# Patient Record
Sex: Female | Born: 1950
Health system: Southern US, Community
[De-identification: ages and names within clinical notes are randomized; demographics above are authoritative.]

## PROBLEM LIST (undated history)

## (undated) DIAGNOSIS — I248 Other forms of acute ischemic heart disease: Secondary | ICD-10-CM

## (undated) DIAGNOSIS — C4491 Basal cell carcinoma of skin, unspecified: Secondary | ICD-10-CM

## (undated) DIAGNOSIS — F32A Depression, unspecified: Secondary | ICD-10-CM

## (undated) DIAGNOSIS — I471 Supraventricular tachycardia, unspecified: Secondary | ICD-10-CM

## (undated) DIAGNOSIS — I2510 Atherosclerotic heart disease of native coronary artery without angina pectoris: Secondary | ICD-10-CM

## (undated) DIAGNOSIS — M810 Age-related osteoporosis without current pathological fracture: Secondary | ICD-10-CM

## (undated) DIAGNOSIS — D649 Anemia, unspecified: Secondary | ICD-10-CM

## (undated) DIAGNOSIS — I10 Essential (primary) hypertension: Secondary | ICD-10-CM

## (undated) DIAGNOSIS — I358 Other nonrheumatic aortic valve disorders: Secondary | ICD-10-CM

## (undated) DIAGNOSIS — F419 Anxiety disorder, unspecified: Secondary | ICD-10-CM

## (undated) DIAGNOSIS — M23300 Other meniscus derangements, unspecified lateral meniscus, right knee: Secondary | ICD-10-CM

## (undated) DIAGNOSIS — F329 Major depressive disorder, single episode, unspecified: Secondary | ICD-10-CM

## (undated) DIAGNOSIS — I7300 Raynaud's syndrome without gangrene: Secondary | ICD-10-CM

## (undated) DIAGNOSIS — K219 Gastro-esophageal reflux disease without esophagitis: Secondary | ICD-10-CM

## (undated) DIAGNOSIS — M1990 Unspecified osteoarthritis, unspecified site: Secondary | ICD-10-CM

## (undated) HISTORY — PX: MOUTH SURGERY: SHX715

## (undated) HISTORY — PX: EYE SURGERY: SHX253

## (undated) HISTORY — PX: CATARACT EXTRACTION, BILATERAL: SHX1313

## (undated) HISTORY — PX: COLONOSCOPY: SHX174

---
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
## 1998-06-27 ENCOUNTER — Other Ambulatory Visit: Admission: RE | Admit: 1998-06-27 | Discharge: 1998-06-27 | Payer: Self-pay | Admitting: Obstetrics & Gynecology

## 1999-03-29 ENCOUNTER — Other Ambulatory Visit: Admission: RE | Admit: 1999-03-29 | Discharge: 1999-03-29 | Payer: Self-pay | Admitting: Obstetrics & Gynecology

## 1999-11-05 ENCOUNTER — Other Ambulatory Visit: Admission: RE | Admit: 1999-11-05 | Discharge: 1999-11-05 | Payer: Self-pay | Admitting: Obstetrics & Gynecology

## 2000-11-18 ENCOUNTER — Other Ambulatory Visit: Admission: RE | Admit: 2000-11-18 | Discharge: 2000-11-18 | Payer: Self-pay | Admitting: Obstetrics & Gynecology

## 2001-01-21 ENCOUNTER — Other Ambulatory Visit: Admission: RE | Admit: 2001-01-21 | Discharge: 2001-01-21 | Payer: Self-pay | Admitting: Obstetrics & Gynecology

## 2002-04-06 ENCOUNTER — Other Ambulatory Visit: Admission: RE | Admit: 2002-04-06 | Discharge: 2002-04-06 | Payer: Self-pay | Admitting: Obstetrics & Gynecology

## 2003-04-08 ENCOUNTER — Other Ambulatory Visit: Admission: RE | Admit: 2003-04-08 | Discharge: 2003-04-08 | Payer: Self-pay | Admitting: Obstetrics & Gynecology

## 2004-05-04 ENCOUNTER — Other Ambulatory Visit: Admission: RE | Admit: 2004-05-04 | Discharge: 2004-05-04 | Payer: Self-pay | Admitting: Obstetrics & Gynecology

## 2005-05-06 ENCOUNTER — Other Ambulatory Visit: Admission: RE | Admit: 2005-05-06 | Discharge: 2005-05-06 | Payer: Self-pay | Admitting: Obstetrics & Gynecology

## 2005-06-24 ENCOUNTER — Ambulatory Visit (HOSPITAL_COMMUNITY): Admission: RE | Admit: 2005-06-24 | Discharge: 2005-06-24 | Payer: Self-pay | Admitting: Gastroenterology

## 2011-07-25 ENCOUNTER — Other Ambulatory Visit: Payer: Self-pay | Admitting: Dermatology

## 2013-08-16 ENCOUNTER — Encounter (HOSPITAL_COMMUNITY): Payer: Self-pay | Admitting: Emergency Medicine

## 2013-08-16 ENCOUNTER — Emergency Department (INDEPENDENT_AMBULATORY_CARE_PROVIDER_SITE_OTHER)
Admission: EM | Admit: 2013-08-16 | Discharge: 2013-08-16 | Disposition: A | Discharge status: Home / Self Care | Payer: 59 | Source: Home / Self Care | Attending: Emergency Medicine | Admitting: Emergency Medicine

## 2013-08-16 DIAGNOSIS — H8309 Labyrinthitis, unspecified ear: Secondary | ICD-10-CM

## 2013-08-16 HISTORY — DX: Anxiety disorder, unspecified: F41.9

## 2013-08-16 HISTORY — DX: Essential (primary) hypertension: I10

## 2013-08-16 HISTORY — DX: Depression, unspecified: F32.A

## 2013-08-16 HISTORY — DX: Major depressive disorder, single episode, unspecified: F32.9

## 2013-08-16 LAB — POCT I-STAT, CHEM 8
BUN: 16 mg/dL (ref 6–23)
Calcium, Ion: 1.25 mmol/L (ref 1.13–1.30)
Chloride: 96 mEq/L (ref 96–112)
Creatinine, Ser: 1.1 mg/dL (ref 0.50–1.10)
Glucose, Bld: 105 mg/dL — ABNORMAL HIGH (ref 70–99)
HCT: 40 % (ref 36.0–46.0)
Hemoglobin: 13.6 g/dL (ref 12.0–15.0)
Potassium: 3.9 mEq/L (ref 3.5–5.1)
Sodium: 133 mEq/L — ABNORMAL LOW (ref 135–145)
TCO2: 24 mmol/L (ref 0–100)

## 2013-08-16 MED ORDER — MECLIZINE HCL 25 MG PO TABS: 25.0000 mg | ORAL_TABLET | Freq: Three times a day (TID) | ORAL | Status: DC | PRN

## 2013-08-16 NOTE — ED Notes (Signed)
States she had usual AM, oatmeal and coffee for breakfast, coffee at work,  but became dizzy while at work. Ate an apple aprox 45 min PTA to see if that helped. At present, w/d/color good, c/o dizzy , worse when she changes position; denies pain , nausea

## 2013-08-16 NOTE — ED Provider Notes (Signed)
Chief Complaint:   Chief Complaint  Patient presents with  . Dizziness    History of Present Illness:    Cynthia [name] is a 62 year old female, a hospital employee, who experienced fairly sudden onset of dizziness this morning while at work. She was sitting at her desk, looking at her computer screen. The patient describes a sensation of her head spinning and like she's about to pass out. She did not lose consciousness. The dizziness is worse with lying flat or bending over and worse when she closed her eyes. His better she's completely still. She denies any associated nausea or vomiting. She's had no earache, ringing in ears, or difficulty hearing. She denies any headache, diplopia, or blurry vision. She's had no nasal congestion, rhinorrhea, or sneezing. She denies any shortness of breath or chest pain, pressure, tightness, or palpitations. She has had no paresthesias, localized muscle weakness, difficulty with speech, swallowing, or ambulation with the exception that she feels somewhat unsteady on her feet. She has never had anything like this before.  Review of Systems:  Other than noted above, the patient denies any of the following symptoms: Systemic:  No fever, chills, fatigue, or weight loss. Eye:  No blurred vision, visual change or diplopia. ENT:  No ear pain, tinnitus, hearing loss, nasal congestion, or rhinorrhea. Cardiac:  No chest pain, dyspnea, palpitations or syncope. Neuro:  No headache, paresthesias, weakness, trouble with speech, coordination or ambulation.  PMFSH:  Past medical history, family history, social history, meds, and allergies were reviewed.  She is allergic to penicillin. She has high blood pressure and takes lisinopril/HCTZ, Wellbutrin, and Xanax.  Physical Exam:   Filed Vitals:   08/16/13 1108 Sitting  08/16/13 1214 Supine  08/16/13 1215 Standing   BP: 149/81 124/68 114/74  Pulse: 69 66 77  Temp: 97.7 F (36.5 C)    TempSrc: Oral    Resp: 20    SpO2:  100%     General:  Alert, oriented times 3, in no distress. Eye:  PERRL, full EOM, no nystagmus. ENT:  TMs and canals normal.  Nasal mucosa normal.  Pharynx clear. Neck:  No adenopathy, tenderness, or mass.  Thyroid normal.  No carotid bruit. Lungs:  Breath sounds clear and equal bilaterally.  No wheezes, rales or rhonchi. Heart:  Regular rhythm.  No gallops, murmers, or rubs. Neuro:  Alert and oriented times 3.  Cranial nerves intact.  No pronator drift.  Finger to nose normal.  No focal weakness.  Sensation intact to light touch.  Romberg's sign negative, gait normal.  Able to do tandem gait well.  Dix-Hallpike maneuver was negative and that it did not make her dizziness any better or worse. She has ongoing symptoms of dizziness during the procedure.  Labs:   Results for orders placed during the hospital encounter of 08/16/13  POCT I-STAT, CHEM 8      Result Value Range   Sodium 133 (*) 135 - 145 mEq/L   Potassium 3.9  3.5 - 5.1 mEq/L   Chloride 96  96 - 112 mEq/L   BUN 16  6 - 23 mg/dL   Creatinine, Ser 9.60  0.50 - 1.10 mg/dL   Glucose, Bld 454 (*) 70 - 99 mg/dL   Calcium, Ion 0.98  1.19 - 1.30 mmol/L   TCO2 24  0 - 100 mmol/L   Hemoglobin 13.6  12.0 - 15.0 g/dL   HCT 14.7  82.9 - 56.2 %     Assessment:  The encounter diagnosis  was Acute labyrinthitis, unspecified laterality.  Plan:   1.  Meds:  The following meds were prescribed:   New Prescriptions   MECLIZINE (ANTIVERT) 25 MG TABLET    Take 1 tablet (25 mg total) by mouth 3 (three) times daily as needed for dizziness.    2.  Patient Education/Counseling:  The patient was given appropriate handouts, self care instructions, and instructed in symptomatic relief.  Advised rest over the next couple days and extra fluids.  3.  Follow up:  The patient was told to follow up if no better in 3 to 4 days, if becoming worse in any way, and given some red flag symptoms such as any new neurological symptoms which would prompt immediate  return.  Follow up with Dr. Kelli Churn if no better in one week.     Reuben Likes, MD 08/16/13 1254

## 2013-08-23 ENCOUNTER — Other Ambulatory Visit: Payer: Self-pay | Admitting: Obstetrics & Gynecology

## 2013-10-25 ENCOUNTER — Ambulatory Visit (INDEPENDENT_AMBULATORY_CARE_PROVIDER_SITE_OTHER): Payer: 59 | Admitting: Licensed Clinical Social Worker

## 2013-10-25 DIAGNOSIS — F411 Generalized anxiety disorder: Secondary | ICD-10-CM

## 2013-10-25 DIAGNOSIS — F331 Major depressive disorder, recurrent, moderate: Secondary | ICD-10-CM

## 2013-11-22 ENCOUNTER — Ambulatory Visit (INDEPENDENT_AMBULATORY_CARE_PROVIDER_SITE_OTHER): Payer: 59 | Admitting: Licensed Clinical Social Worker

## 2013-11-22 DIAGNOSIS — F411 Generalized anxiety disorder: Secondary | ICD-10-CM

## 2013-11-22 DIAGNOSIS — F331 Major depressive disorder, recurrent, moderate: Secondary | ICD-10-CM

## 2013-12-13 ENCOUNTER — Ambulatory Visit (INDEPENDENT_AMBULATORY_CARE_PROVIDER_SITE_OTHER): Payer: 59 | Admitting: Licensed Clinical Social Worker

## 2013-12-13 DIAGNOSIS — F411 Generalized anxiety disorder: Secondary | ICD-10-CM

## 2013-12-13 DIAGNOSIS — F331 Major depressive disorder, recurrent, moderate: Secondary | ICD-10-CM

## 2014-01-10 ENCOUNTER — Ambulatory Visit (INDEPENDENT_AMBULATORY_CARE_PROVIDER_SITE_OTHER): Payer: 59 | Admitting: Licensed Clinical Social Worker

## 2014-01-10 DIAGNOSIS — F331 Major depressive disorder, recurrent, moderate: Secondary | ICD-10-CM

## 2014-01-10 DIAGNOSIS — F411 Generalized anxiety disorder: Secondary | ICD-10-CM

## 2014-02-07 ENCOUNTER — Ambulatory Visit: Payer: 59 | Admitting: Licensed Clinical Social Worker

## 2014-02-14 ENCOUNTER — Ambulatory Visit (INDEPENDENT_AMBULATORY_CARE_PROVIDER_SITE_OTHER): Payer: 59 | Admitting: Licensed Clinical Social Worker

## 2014-02-14 DIAGNOSIS — F411 Generalized anxiety disorder: Secondary | ICD-10-CM

## 2014-02-14 DIAGNOSIS — F331 Major depressive disorder, recurrent, moderate: Secondary | ICD-10-CM

## 2014-03-28 ENCOUNTER — Ambulatory Visit (INDEPENDENT_AMBULATORY_CARE_PROVIDER_SITE_OTHER): Payer: 59 | Admitting: Licensed Clinical Social Worker

## 2014-03-28 DIAGNOSIS — F411 Generalized anxiety disorder: Secondary | ICD-10-CM

## 2014-03-28 DIAGNOSIS — F331 Major depressive disorder, recurrent, moderate: Secondary | ICD-10-CM

## 2014-04-25 ENCOUNTER — Ambulatory Visit (INDEPENDENT_AMBULATORY_CARE_PROVIDER_SITE_OTHER): Payer: 59 | Admitting: Licensed Clinical Social Worker

## 2014-04-25 DIAGNOSIS — F411 Generalized anxiety disorder: Secondary | ICD-10-CM

## 2014-04-25 DIAGNOSIS — F331 Major depressive disorder, recurrent, moderate: Secondary | ICD-10-CM

## 2014-06-13 ENCOUNTER — Ambulatory Visit (INDEPENDENT_AMBULATORY_CARE_PROVIDER_SITE_OTHER): Payer: 59 | Admitting: Licensed Clinical Social Worker

## 2014-06-13 DIAGNOSIS — F331 Major depressive disorder, recurrent, moderate: Secondary | ICD-10-CM

## 2014-06-13 DIAGNOSIS — F411 Generalized anxiety disorder: Secondary | ICD-10-CM

## 2014-07-18 ENCOUNTER — Ambulatory Visit (INDEPENDENT_AMBULATORY_CARE_PROVIDER_SITE_OTHER): Payer: 59 | Admitting: Licensed Clinical Social Worker

## 2014-07-18 DIAGNOSIS — F411 Generalized anxiety disorder: Secondary | ICD-10-CM

## 2014-07-18 DIAGNOSIS — F332 Major depressive disorder, recurrent severe without psychotic features: Secondary | ICD-10-CM

## 2014-09-12 ENCOUNTER — Ambulatory Visit: Payer: 59 | Admitting: Licensed Clinical Social Worker

## 2014-10-10 ENCOUNTER — Ambulatory Visit (INDEPENDENT_AMBULATORY_CARE_PROVIDER_SITE_OTHER): Payer: 59 | Admitting: Licensed Clinical Social Worker

## 2014-10-10 DIAGNOSIS — F332 Major depressive disorder, recurrent severe without psychotic features: Secondary | ICD-10-CM

## 2014-10-10 DIAGNOSIS — F411 Generalized anxiety disorder: Secondary | ICD-10-CM

## 2015-10-23 MED FILL — NITROFURANTOIN MCR 100 MG C: 100 | 30 days supply | Qty: 30 | Fill #0

## 2015-10-23 MED FILL — NYSTATIN/TRIAMCINOLONE CRM: 100000-0.1 | 30 days supply | Qty: 30 | Fill #1

## 2015-11-22 MED FILL — NITROFURANTOIN MCR 100 MG C: 100 | 30 days supply | Qty: 30 | Fill #1

## 2015-11-22 MED FILL — BUPROPION HCL XL 300 MG TAB: 300 | 90 days supply | Qty: 90 | Fill #1

## 2015-11-22 MED FILL — NYSTATIN/TRIAMCINOLONE CRM: 100000-0.1 | 30 days supply | Qty: 30 | Fill #2

## 2015-11-30 DIAGNOSIS — H25013 Cortical age-related cataract, bilateral: Secondary | ICD-10-CM | POA: Diagnosis not present

## 2015-11-30 DIAGNOSIS — H2513 Age-related nuclear cataract, bilateral: Secondary | ICD-10-CM | POA: Diagnosis not present

## 2015-12-11 MED FILL — clonazePAM 0.5 MG TABS: 0.5 | 90 days supply | Qty: 180 | Fill #1

## 2015-12-13 DIAGNOSIS — H25012 Cortical age-related cataract, left eye: Secondary | ICD-10-CM | POA: Diagnosis not present

## 2015-12-13 DIAGNOSIS — H2512 Age-related nuclear cataract, left eye: Secondary | ICD-10-CM | POA: Diagnosis not present

## 2015-12-13 MED FILL — OFLOXACIN 0.3% EYE DROPS: 0.3 | 25 days supply | Qty: 5 | Fill #0

## 2015-12-20 MED FILL — PROLENSA 0.07% EYE DROPS: 0.07 | 30 days supply | Qty: 3 | Fill #0

## 2016-01-03 DIAGNOSIS — H25012 Cortical age-related cataract, left eye: Secondary | ICD-10-CM | POA: Diagnosis not present

## 2016-01-03 DIAGNOSIS — H2512 Age-related nuclear cataract, left eye: Secondary | ICD-10-CM | POA: Diagnosis not present

## 2016-01-04 MED FILL — NYSTATIN/TRIAMCINOLONE CRM: 100000-0.1 | 30 days supply | Qty: 30 | Fill #3

## 2016-01-04 MED FILL — NITROFURANTOIN MCR 100 MG C: 100 | 30 days supply | Qty: 30 | Fill #2

## 2016-01-05 DIAGNOSIS — H2511 Age-related nuclear cataract, right eye: Secondary | ICD-10-CM | POA: Diagnosis not present

## 2016-01-05 DIAGNOSIS — H25011 Cortical age-related cataract, right eye: Secondary | ICD-10-CM | POA: Diagnosis not present

## 2016-01-05 MED FILL — OFLOXACIN 0.3% EYE DROPS: 0.3 | 25 days supply | Qty: 5 | Fill #1

## 2016-01-09 MED FILL — PROLENSA 0.07% EYE DROPS: 0.07 | 30 days supply | Qty: 3 | Fill #1

## 2016-01-10 DIAGNOSIS — H25011 Cortical age-related cataract, right eye: Secondary | ICD-10-CM | POA: Diagnosis not present

## 2016-01-10 DIAGNOSIS — H2511 Age-related nuclear cataract, right eye: Secondary | ICD-10-CM | POA: Diagnosis not present

## 2016-01-29 MED FILL — LISINOPRIL-HCTZ 20-12.5 MG: 20-12.5 | 90 days supply | Qty: 90 | Fill #1

## 2016-01-29 MED FILL — NYSTATIN/TRIAMCINOLONE CRM: 100000-0.1 | 30 days supply | Qty: 30 | Fill #4

## 2016-01-29 MED FILL — CLINDAMYCIN HCL 150 MG CAP: 150 | 7 days supply | Qty: 28 | Fill #0

## 2016-02-19 MED FILL — CLINDAMYCIN HCL 150 MG CAP: 150 | 7 days supply | Qty: 28 | Fill #1

## 2016-02-29 DIAGNOSIS — E559 Vitamin D deficiency, unspecified: Secondary | ICD-10-CM | POA: Diagnosis not present

## 2016-02-29 DIAGNOSIS — F324 Major depressive disorder, single episode, in partial remission: Secondary | ICD-10-CM | POA: Diagnosis not present

## 2016-02-29 DIAGNOSIS — G4700 Insomnia, unspecified: Secondary | ICD-10-CM | POA: Diagnosis not present

## 2016-02-29 DIAGNOSIS — F419 Anxiety disorder, unspecified: Secondary | ICD-10-CM | POA: Diagnosis not present

## 2016-02-29 DIAGNOSIS — I10 Essential (primary) hypertension: Secondary | ICD-10-CM | POA: Diagnosis not present

## 2016-02-29 DIAGNOSIS — E7800 Pure hypercholesterolemia, unspecified: Secondary | ICD-10-CM | POA: Diagnosis not present

## 2016-02-29 DIAGNOSIS — E669 Obesity, unspecified: Secondary | ICD-10-CM | POA: Diagnosis not present

## 2016-03-01 MED FILL — SERTRALINE HCL 50 MG TABLET: 50 | 90 days supply | Qty: 90 | Fill #0

## 2016-03-01 MED FILL — BUPROPION HCL XL 300 MG TAB: 300 | 90 days supply | Qty: 90 | Fill #0

## 2016-03-04 MED FILL — VIT D2 1.25 MG (50,000 UNIT: 1.25 MG | 84 days supply | Qty: 12 | Fill #0

## 2016-03-08 MED FILL — clonazePAM 0.5 MG TABS: 0.5 | 90 days supply | Qty: 180 | Fill #0

## 2016-03-11 MED FILL — NYSTATIN/TRIAMCINOLONE CRM: 100000-0.1 | 30 days supply | Qty: 30 | Fill #5

## 2016-03-22 MED FILL — NITROFURANTOIN MCR 100 MG C: 100 | 30 days supply | Qty: 30 | Fill #3

## 2016-04-22 MED FILL — NYSTATIN/TRIAMCINOLONE CRM: 100000-0.1 | 30 days supply | Qty: 30 | Fill #6

## 2016-05-13 MED FILL — LISINOPRIL-HCTZ 20-12.5 MG: 20-12.5 | 90 days supply | Qty: 90 | Fill #0

## 2016-05-20 DIAGNOSIS — Z961 Presence of intraocular lens: Secondary | ICD-10-CM | POA: Diagnosis not present

## 2016-05-20 MED FILL — NITROFURANTOIN MCR 100 MG C: 100 | 30 days supply | Qty: 30 | Fill #4

## 2016-05-20 MED FILL — BUPROPION HCL XL 300 MG TAB: 300 | 90 days supply | Qty: 90 | Fill #1

## 2016-05-20 MED FILL — NYSTATIN/TRIAMCINOLONE CRM: 100000-0.1 | 15 days supply | Qty: 30 | Fill #1

## 2016-06-10 MED FILL — clonazePAM 0.5 MG TABS: 0.5 | 90 days supply | Qty: 180 | Fill #0

## 2016-06-10 MED FILL — SERTRALINE HCL 50 MG TABLET: 50 | 90 days supply | Qty: 90 | Fill #1

## 2016-07-19 MED FILL — NYSTATIN/TRIAMCINOLONE CRM: 100000-0.1 | 15 days supply | Qty: 30 | Fill #2

## 2016-08-14 MED FILL — NITROFURANTOIN MCR 100 MG C: 100 | 30 days supply | Qty: 30 | Fill #0

## 2016-08-14 MED FILL — LISINOPRIL-HCTZ 20-12.5 MG: 20-12.5 | 90 days supply | Qty: 90 | Fill #1

## 2016-08-14 MED FILL — NYSTATIN/TRIAMCINOLONE CRM: 100000-0.1 | 15 days supply | Qty: 30 | Fill #0

## 2016-08-19 MED FILL — BUPROPION HCL XL 300 MG TAB: 300 | 90 days supply | Qty: 90 | Fill #0

## 2016-09-05 DIAGNOSIS — Z23 Encounter for immunization: Secondary | ICD-10-CM | POA: Diagnosis not present

## 2016-09-05 DIAGNOSIS — G4700 Insomnia, unspecified: Secondary | ICD-10-CM | POA: Diagnosis not present

## 2016-09-05 DIAGNOSIS — F419 Anxiety disorder, unspecified: Secondary | ICD-10-CM | POA: Diagnosis not present

## 2016-09-05 DIAGNOSIS — E669 Obesity, unspecified: Secondary | ICD-10-CM | POA: Diagnosis not present

## 2016-09-05 DIAGNOSIS — E7800 Pure hypercholesterolemia, unspecified: Secondary | ICD-10-CM | POA: Diagnosis not present

## 2016-09-05 DIAGNOSIS — I10 Essential (primary) hypertension: Secondary | ICD-10-CM | POA: Diagnosis not present

## 2016-09-05 DIAGNOSIS — F324 Major depressive disorder, single episode, in partial remission: Secondary | ICD-10-CM | POA: Diagnosis not present

## 2016-09-05 MED FILL — SERTRALINE HCL 50 MG TABLET: 50 | 90 days supply | Qty: 90 | Fill #0

## 2016-09-06 MED FILL — clonazePAM 0.5 MG TABS: 0.5 | 90 days supply | Qty: 180 | Fill #0

## 2016-09-16 ENCOUNTER — Other Ambulatory Visit: Payer: Self-pay | Admitting: Obstetrics & Gynecology

## 2016-09-16 DIAGNOSIS — Z124 Encounter for screening for malignant neoplasm of cervix: Secondary | ICD-10-CM | POA: Diagnosis not present

## 2016-09-16 DIAGNOSIS — Z01419 Encounter for gynecological examination (general) (routine) without abnormal findings: Secondary | ICD-10-CM | POA: Diagnosis not present

## 2016-09-16 DIAGNOSIS — Z1231 Encounter for screening mammogram for malignant neoplasm of breast: Secondary | ICD-10-CM | POA: Diagnosis not present

## 2016-09-16 MED FILL — NYSTATIN/TRIAMCINOLONE CRM: 100000-0.1 | 15 days supply | Qty: 30 | Fill #0

## 2016-09-16 MED FILL — NITROFURANTOIN MCR 100 MG C: 100 | 30 days supply | Qty: 30 | Fill #0

## 2016-09-17 LAB — CYTOLOGY - PAP

## 2016-09-18 MED FILL — PREMARIN VAGINAL CREAM-APPL: 0.625 | 90 days supply | Qty: 30 | Fill #0

## 2016-10-03 DIAGNOSIS — F324 Major depressive disorder, single episode, in partial remission: Secondary | ICD-10-CM | POA: Diagnosis not present

## 2016-11-25 MED FILL — BUPROPION HCL XL 300 MG TAB: 300 | 90 days supply | Qty: 90 | Fill #1

## 2016-11-25 MED FILL — VIT D2 1.25 MG (50,000 UNIT: 1.25 MG | 84 days supply | Qty: 12 | Fill #1

## 2016-11-25 MED FILL — LISINOPRIL-HCTZ 20-12.5 MG: 20-12.5 | 90 days supply | Qty: 90 | Fill #0

## 2016-11-25 MED FILL — NYSTATIN/TRIAMCINOLONE CRM: 100000-0.1 | 15 days supply | Qty: 30 | Fill #1

## 2016-12-02 MED FILL — clonazePAM 0.5 MG TABS: 0.5 | 90 days supply | Qty: 180 | Fill #1

## 2016-12-02 MED FILL — SERTRALINE HCL 50 MG TABLET: 50 | 90 days supply | Qty: 90 | Fill #1

## 2017-01-07 DIAGNOSIS — Z85828 Personal history of other malignant neoplasm of skin: Secondary | ICD-10-CM | POA: Diagnosis not present

## 2017-01-07 DIAGNOSIS — D2239 Melanocytic nevi of other parts of face: Secondary | ICD-10-CM | POA: Diagnosis not present

## 2017-01-07 DIAGNOSIS — L738 Other specified follicular disorders: Secondary | ICD-10-CM | POA: Diagnosis not present

## 2017-01-13 DIAGNOSIS — S83241A Other tear of medial meniscus, current injury, right knee, initial encounter: Secondary | ICD-10-CM | POA: Diagnosis not present

## 2017-01-14 ENCOUNTER — Other Ambulatory Visit: Payer: Self-pay | Admitting: Orthopaedic Surgery

## 2017-01-14 DIAGNOSIS — S83241A Other tear of medial meniscus, current injury, right knee, initial encounter: Secondary | ICD-10-CM

## 2017-01-15 MED FILL — NYSTATIN/TRIAMCINOLONE CRM: 100000-0.1 | 15 days supply | Qty: 30 | Fill #2

## 2017-01-18 ENCOUNTER — Ambulatory Visit
Admission: RE | Admit: 2017-01-18 | Discharge: 2017-01-18 | Disposition: A | Discharge status: Home / Self Care | Payer: 59 | Source: Ambulatory Visit | Attending: Orthopaedic Surgery | Admitting: Orthopaedic Surgery

## 2017-01-18 DIAGNOSIS — S83241A Other tear of medial meniscus, current injury, right knee, initial encounter: Secondary | ICD-10-CM

## 2017-01-22 DIAGNOSIS — M25561 Pain in right knee: Secondary | ICD-10-CM | POA: Diagnosis not present

## 2017-01-22 DIAGNOSIS — S83241A Other tear of medial meniscus, current injury, right knee, initial encounter: Secondary | ICD-10-CM | POA: Diagnosis not present

## 2017-02-07 ENCOUNTER — Other Ambulatory Visit: Payer: Self-pay | Admitting: Orthopaedic Surgery

## 2017-02-07 DIAGNOSIS — M25561 Pain in right knee: Secondary | ICD-10-CM | POA: Diagnosis not present

## 2017-02-11 NOTE — H&P (Signed)
Cynthia [name] is an 66 y.o. female.   Chief Complaint: Right knee pain HPI: Cynthia [name] is here today for follow-up of her right knee.  Unfortunately she did not get any significant relief from the injection.  This cycle of pain started 3 months ago when she got out of the car and twisted her knee.  She did not like the way the Norco upset her stomach so she is switch back to the tramadol.  She does not need a refill of this right now but states she may call if she does.  At this point she cannot walk across a parking lot and this is keeping her awake at night.  MRI:  I reviewed an MRI scan films and report of a study done at the [facility] system on 01/18/17.  This shows radial tears of both the medial posterior horn and the lateral anterior horn.  She has some cartilage loss particularly in the lateral compartment which they read as high grade.  She does have a bone bruise on the lateral tibial plateau.  Past Medical History:  Diagnosis Date  . Anxiety disorder   . Depression   . Hypertension     No past surgical history on file.  No family history on file. Social History:  reports that she has never smoked. She does not have any smokeless tobacco history on file. She reports that she drinks alcohol. Her drug history is not on file.  Allergies:  Allergies  Allergen Reactions  . Penicillins     No prescriptions prior to admission.    No results found for this or any previous visit (from the past 48 hour(s)). No results found.  Review of Systems  Musculoskeletal: Positive for joint pain.       Right knee    There were no vitals taken for this visit. Physical Exam  Constitutional: She is oriented to person, place, and time. She appears well-developed and well-nourished.  HENT:  Head: Normocephalic and atraumatic.  Eyes: Pupils are equal, round, and reactive to light.  Neck: Normal range of motion.  Cardiovascular: Normal rate and regular rhythm.   Respiratory: Effort normal.  GI: Soft.   Musculoskeletal:  Examination of the right knee shows range of motion from about 5-90 today.  She has pain on both the medial and lateral joint lines.  Trace effusion.  1+ crepitation.  Her calf is soft and nontender.  She is neurovascularly intact distally.   Neurological: She is alert and oriented to person, place, and time.  Skin: Skin is warm and dry.  Psychiatric: She has a normal mood and affect. Her behavior is normal. Judgment and thought content normal.     Assessment/Plan Assessment: Right knee torn menisci and degeneration by MRI 2018 with injection 01/22/17  Plan: Unfortunately Baylee did not get any relief from her cortisone injection.  We did discuss possible Visco supplementation but since she did not get any relief at all we will proceed with a knee arthroscopy.  I did inform her that we can definitely make her knee better but not new with her underlying arthritis.  She states that she would just be happy to get back to where she was 3 months ago prior to her twisting injury. I reviewed risks of anesthesia and infection as well as potential for DVT related to a knee arthroscopy.  I've stressed the importance of some postoperative physical therapy to optimize results and we will try to set up an appointment.  Two to  four  weeks for recovery would be typical but that is a little variable.    Cezar Misiaszek, Larwance Sachs, PA-C 02/11/2017, 2:08 PM

## 2017-02-17 ENCOUNTER — Encounter (HOSPITAL_BASED_OUTPATIENT_CLINIC_OR_DEPARTMENT_OTHER): Payer: Self-pay | Admitting: *Deleted

## 2017-02-18 ENCOUNTER — Encounter (HOSPITAL_BASED_OUTPATIENT_CLINIC_OR_DEPARTMENT_OTHER)
Admission: RE | Admit: 2017-02-18 | Discharge: 2017-02-18 | Disposition: A | Discharge status: Home / Self Care | Payer: 59 | Source: Ambulatory Visit | Attending: Orthopaedic Surgery | Admitting: Orthopaedic Surgery

## 2017-02-18 DIAGNOSIS — X501XXA Overexertion from prolonged static or awkward postures, initial encounter: Secondary | ICD-10-CM | POA: Diagnosis not present

## 2017-02-18 DIAGNOSIS — S83281A Other tear of lateral meniscus, current injury, right knee, initial encounter: Secondary | ICD-10-CM | POA: Diagnosis not present

## 2017-02-18 DIAGNOSIS — F419 Anxiety disorder, unspecified: Secondary | ICD-10-CM | POA: Diagnosis not present

## 2017-02-18 DIAGNOSIS — M94261 Chondromalacia, right knee: Secondary | ICD-10-CM | POA: Diagnosis not present

## 2017-02-18 DIAGNOSIS — Z79899 Other long term (current) drug therapy: Secondary | ICD-10-CM | POA: Diagnosis not present

## 2017-02-18 DIAGNOSIS — X58XXXA Exposure to other specified factors, initial encounter: Secondary | ICD-10-CM | POA: Diagnosis not present

## 2017-02-18 DIAGNOSIS — Z880 Allergy status to penicillin: Secondary | ICD-10-CM | POA: Diagnosis not present

## 2017-02-18 DIAGNOSIS — F329 Major depressive disorder, single episode, unspecified: Secondary | ICD-10-CM | POA: Diagnosis not present

## 2017-02-18 DIAGNOSIS — S83241A Other tear of medial meniscus, current injury, right knee, initial encounter: Secondary | ICD-10-CM | POA: Diagnosis not present

## 2017-02-18 DIAGNOSIS — I10 Essential (primary) hypertension: Secondary | ICD-10-CM | POA: Diagnosis not present

## 2017-02-18 LAB — BASIC METABOLIC PANEL
Anion gap: 6 (ref 5–15)
BUN: 23 mg/dL — ABNORMAL HIGH (ref 6–20)
CO2: 29 mmol/L (ref 22–32)
Calcium: 9.3 mg/dL (ref 8.9–10.3)
Chloride: 101 mmol/L (ref 101–111)
Creatinine, Ser: 0.8 mg/dL (ref 0.44–1.00)
GFR calc Af Amer: >60 mL/min (ref >60)
GFR calc non Af Amer: >60 mL/min (ref >60)
Glucose, Bld: 103 mg/dL — ABNORMAL HIGH (ref 65–99)
Potassium: 4.9 mmol/L (ref 3.5–5.1)
Sodium: 136 mmol/L (ref 135–145)

## 2017-02-18 NOTE — Progress Notes (Signed)
Labs drawn and EKG done, EKG reviewed by Dr. Elton Sin, will proceed with surgery as scheduled.

## 2017-02-19 NOTE — H&P (Signed)
ELMYRA BANWART is an 66 y.o. female.   Chief Complaint: Right knee pain HPI: Yanett is here today for follow-up of her right knee.  Unfortunately she did not get any significant relief from the injection.  This cycle of pain started 3 months ago when she got out of the car and twisted her knee.  She did not like the way the Norco upset her stomach so she is switch back to the tramadol.  She does not need a refill of this right now but states she may call if she does.  At this point she cannot walk across a parking lot and this is keeping her awake at night.     MRI:  I reviewed an MRI scan films and report of a study done at the [facility] system on 01/18/17.  This shows radial tears of both the medial posterior horn and the lateral anterior horn.  She has some cartilage loss particularly in the lateral compartment which they read as high grade.  She does have a bone bruise on the lateral tibial plateau.  Past Medical History:  Diagnosis Date  . Anxiety   . Anxiety disorder   . Arthritis    right knee  . Depression   . GERD (gastroesophageal reflux disease)    TUMS  . Hypertension   . Lateral meniscus derangement, right     Past Surgical History:  Procedure Laterality Date  . CESAREAN SECTION     x3  . EYE SURGERY Bilateral    cataract    History reviewed. No pertinent family history. Social History:  reports that she has never smoked. She has never used smokeless tobacco. She reports that she drinks alcohol. Her drug history is not on file.  Allergies:  Allergies  Allergen Reactions  . Penicillins Rash    No prescriptions prior to admission.    Results for orders placed or performed during the hospital encounter of 02/21/17 (from the past 48 hour(s))  Basic metabolic panel     Status: Abnormal   Collection Time: 02/18/17  9:18 AM  Result Value Ref Range   Sodium 136 135 - 145 mmol/L   Potassium 4.9 3.5 - 5.1 mmol/L   Chloride 101 101 - 111 mmol/L   CO2 29 22 - 32 mmol/L   Glucose, Bld 103 (H) 65 - 99 mg/dL   BUN 23 (H) 6 - 20 mg/dL   Creatinine, Ser 0.80 0.44 - 1.00 mg/dL   Calcium 9.3 8.9 - 10.3 mg/dL   GFR calc non Af Amer >60 >60 mL/min   GFR calc Af Amer >60 >60 mL/min    Comment: (NOTE) The eGFR has been calculated using the CKD EPI equation. This calculation has not been validated in all clinical situations. eGFR's persistently <60 mL/min signify possible Chronic Kidney Disease.    Anion gap 6 5 - 15   No results found.  Review of Systems  Musculoskeletal: Positive for joint pain.       Right knee  All other systems reviewed and are negative.   Height '5\' 6"'  (1.676 m), weight 88.5 kg (195 lb). Physical Exam  Constitutional: She is oriented to person, place, and time. She appears well-developed and well-nourished.  HENT:  Head: Normocephalic.  Eyes: Pupils are equal, round, and reactive to light.  Neck: Normal range of motion.  Cardiovascular: Normal rate and regular rhythm.   Respiratory: Effort normal.  GI: Soft.  Musculoskeletal:  Examination of the right knee shows range of motion from  about 5-90 today.  She has pain on both the medial and lateral joint lines.  Trace effusion.  1+ crepitation.  Her calf is soft and nontender.  She is neurovascularly intact distally.   Neurological: She is alert and oriented to person, place, and time.  Skin: Skin is warm and dry.  Psychiatric: She has a normal mood and affect. Her behavior is normal. Judgment and thought content normal.     Assessment/Plan Assessment: Right knee torn menisci and degeneration by MRI 2018 with injection 01/22/17  Plan: Unfortunately Brinkley did not get any relief from her cortisone injection.  We did discuss possible Visco supplementation but since she did not get any relief at all we will proceed with a knee arthroscopy.  I did inform her that we can definitely make her knee better but not new with her underlying arthritis.  She states that she would just be happy to get  back to where she was 3 months ago prior to her twisting injury.  She will continue on the tramadol for pain.  If she needs a refill she will call us. I reviewed risks of anesthesia and infection as well as potential for DVT related to a knee arthroscopy.  I've stressed the importance of some postoperative physical therapy to optimize results and we will try to set up an appointment.  Two to four  weeks for recovery would be typical but that is a little variable.    Florence Yeung, Larwance Sachs, PA-C 02/19/2017, 11:59 AM

## 2017-02-21 ENCOUNTER — Encounter (HOSPITAL_BASED_OUTPATIENT_CLINIC_OR_DEPARTMENT_OTHER)
Admission: RE | Disposition: A | Discharge status: Home / Self Care | Payer: Self-pay | Source: Ambulatory Visit | Attending: Orthopaedic Surgery

## 2017-02-21 ENCOUNTER — Encounter (HOSPITAL_BASED_OUTPATIENT_CLINIC_OR_DEPARTMENT_OTHER): Payer: Self-pay | Admitting: Anesthesiology

## 2017-02-21 ENCOUNTER — Ambulatory Visit (HOSPITAL_BASED_OUTPATIENT_CLINIC_OR_DEPARTMENT_OTHER): Payer: 59 | Admitting: Anesthesiology

## 2017-02-21 ENCOUNTER — Ambulatory Visit (HOSPITAL_BASED_OUTPATIENT_CLINIC_OR_DEPARTMENT_OTHER)
Admission: RE | Admit: 2017-02-21 | Discharge: 2017-02-21 | Disposition: A | Discharge status: Home / Self Care | Payer: 59 | Source: Ambulatory Visit | Attending: Orthopaedic Surgery | Admitting: Orthopaedic Surgery

## 2017-02-21 DIAGNOSIS — S83281A Other tear of lateral meniscus, current injury, right knee, initial encounter: Secondary | ICD-10-CM | POA: Insufficient documentation

## 2017-02-21 DIAGNOSIS — M94261 Chondromalacia, right knee: Secondary | ICD-10-CM | POA: Diagnosis not present

## 2017-02-21 DIAGNOSIS — S83241A Other tear of medial meniscus, current injury, right knee, initial encounter: Secondary | ICD-10-CM | POA: Diagnosis not present

## 2017-02-21 DIAGNOSIS — F419 Anxiety disorder, unspecified: Secondary | ICD-10-CM | POA: Insufficient documentation

## 2017-02-21 DIAGNOSIS — F329 Major depressive disorder, single episode, unspecified: Secondary | ICD-10-CM | POA: Insufficient documentation

## 2017-02-21 DIAGNOSIS — I10 Essential (primary) hypertension: Secondary | ICD-10-CM | POA: Insufficient documentation

## 2017-02-21 DIAGNOSIS — M23321 Other meniscus derangements, posterior horn of medial meniscus, right knee: Secondary | ICD-10-CM | POA: Diagnosis not present

## 2017-02-21 DIAGNOSIS — Z880 Allergy status to penicillin: Secondary | ICD-10-CM | POA: Diagnosis not present

## 2017-02-21 DIAGNOSIS — X58XXXA Exposure to other specified factors, initial encounter: Secondary | ICD-10-CM | POA: Insufficient documentation

## 2017-02-21 DIAGNOSIS — Z79899 Other long term (current) drug therapy: Secondary | ICD-10-CM | POA: Insufficient documentation

## 2017-02-21 DIAGNOSIS — X501XXA Overexertion from prolonged static or awkward postures, initial encounter: Secondary | ICD-10-CM | POA: Insufficient documentation

## 2017-02-21 DIAGNOSIS — M23361 Other meniscus derangements, other lateral meniscus, right knee: Secondary | ICD-10-CM | POA: Diagnosis not present

## 2017-02-21 HISTORY — PX: CHONDROPLASTY: SHX5177

## 2017-02-21 HISTORY — DX: Anxiety disorder, unspecified: F41.9

## 2017-02-21 HISTORY — PX: KNEE ARTHROSCOPY WITH LATERAL MENISECTOMY: SHX6193

## 2017-02-21 HISTORY — PX: KNEE ARTHROSCOPY WITH MEDIAL MENISECTOMY: SHX5651

## 2017-02-21 HISTORY — DX: Gastro-esophageal reflux disease without esophagitis: K21.9

## 2017-02-21 HISTORY — DX: Unspecified osteoarthritis, unspecified site: M19.90

## 2017-02-21 HISTORY — DX: Other meniscus derangements, unspecified lateral meniscus, right knee: M23.300

## 2017-02-21 SURGERY — ARTHROSCOPY, KNEE, WITH LATERAL MENISCECTOMY
Anesthesia: General | Site: Knee | Laterality: Right

## 2017-02-21 MED ORDER — BUPIVACAINE HCL (PF) 0.5 % IJ SOLN
INTRAMUSCULAR | Status: DC | PRN
  Administered 2017-02-21: 5 mL

## 2017-02-21 MED ORDER — ONDANSETRON HCL 4 MG/2ML IJ SOLN
INTRAMUSCULAR | Status: AC
  Filled 2017-02-21: qty 2

## 2017-02-21 MED ORDER — FENTANYL CITRATE (PF) 100 MCG/2ML IJ SOLN
50.0000 ug | INTRAMUSCULAR | Status: DC | PRN
  Administered 2017-02-21: 50 ug via INTRAVENOUS
  Administered 2017-02-21: 100 ug via INTRAVENOUS

## 2017-02-21 MED ORDER — BUPIVACAINE HCL (PF) 0.5 % IJ SOLN
INTRAMUSCULAR | Status: AC
  Filled 2017-02-21: qty 30

## 2017-02-21 MED ORDER — MIDAZOLAM HCL 2 MG/2ML IJ SOLN
INTRAMUSCULAR | Status: AC
  Filled 2017-02-21: qty 2

## 2017-02-21 MED ORDER — MIDAZOLAM HCL 2 MG/2ML IJ SOLN
1.0000 mg | INTRAMUSCULAR | Status: DC | PRN
  Administered 2017-02-21: 2 mg via INTRAVENOUS

## 2017-02-21 MED ORDER — DEXAMETHASONE SODIUM PHOSPHATE 10 MG/ML IJ SOLN
INTRAMUSCULAR | Status: AC
  Filled 2017-02-21: qty 1

## 2017-02-21 MED ORDER — METHYLPREDNISOLONE ACETATE 80 MG/ML IJ SUSP
INTRAMUSCULAR | Status: AC
  Filled 2017-02-21: qty 1

## 2017-02-21 MED ORDER — LIDOCAINE 2% (20 MG/ML) 5 ML SYRINGE
INTRAMUSCULAR | Status: AC
  Filled 2017-02-21: qty 5

## 2017-02-21 MED ORDER — VANCOMYCIN HCL IN DEXTROSE 1-5 GM/200ML-% IV SOLN
INTRAVENOUS | Status: AC
  Filled 2017-02-21: qty 200

## 2017-02-21 MED ORDER — HYDROCODONE-ACETAMINOPHEN 5-325 MG PO TABS
1.0000 | ORAL_TABLET | Freq: Once | ORAL | Status: AC
  Administered 2017-02-21: 1 via ORAL

## 2017-02-21 MED ORDER — METHYLPREDNISOLONE ACETATE 80 MG/ML IJ SUSP
INTRAMUSCULAR | Status: DC | PRN
  Administered 2017-02-21: 80 mg

## 2017-02-21 MED ORDER — SCOPOLAMINE 1 MG/3DAYS TD PT72: 1.0000 | MEDICATED_PATCH | Freq: Once | TRANSDERMAL | Status: DC | PRN

## 2017-02-21 MED ORDER — HYDROCODONE-ACETAMINOPHEN 5-325 MG PO TABS: 1.0000 | ORAL_TABLET | ORAL | 0 refills | Status: DC | PRN

## 2017-02-21 MED ORDER — PROPOFOL 10 MG/ML IV BOLUS
INTRAVENOUS | Status: DC | PRN
  Administered 2017-02-21: 200 mg via INTRAVENOUS

## 2017-02-21 MED ORDER — CHLORHEXIDINE GLUCONATE 4 % EX LIQD: 60.0000 mL | Freq: Once | CUTANEOUS | Status: DC

## 2017-02-21 MED ORDER — SODIUM CHLORIDE 0.9 % IR SOLN
Status: DC | PRN
  Administered 2017-02-21: 3000 mL

## 2017-02-21 MED ORDER — KETOROLAC TROMETHAMINE 30 MG/ML IJ SOLN
INTRAMUSCULAR | Status: AC
  Filled 2017-02-21: qty 1

## 2017-02-21 MED ORDER — FENTANYL CITRATE (PF) 100 MCG/2ML IJ SOLN
INTRAMUSCULAR | Status: AC
  Filled 2017-02-21: qty 2

## 2017-02-21 MED ORDER — LACTATED RINGERS IV SOLN: INTRAVENOUS | Status: DC

## 2017-02-21 MED ORDER — HYDROMORPHONE HCL 1 MG/ML IJ SOLN
INTRAMUSCULAR | Status: AC
  Filled 2017-02-21: qty 1

## 2017-02-21 MED ORDER — HYDROCODONE-ACETAMINOPHEN 5-325 MG PO TABS
ORAL_TABLET | ORAL | Status: AC
  Filled 2017-02-21: qty 1

## 2017-02-21 MED ORDER — KETOROLAC TROMETHAMINE 30 MG/ML IJ SOLN
INTRAMUSCULAR | Status: DC | PRN
  Administered 2017-02-21: 30 mg via INTRAVENOUS

## 2017-02-21 MED ORDER — HYDROMORPHONE HCL 1 MG/ML IJ SOLN
0.2500 mg | INTRAMUSCULAR | Status: DC | PRN
  Administered 2017-02-21 (×2): 0.5 mg via INTRAVENOUS

## 2017-02-21 MED ORDER — MEPERIDINE HCL 25 MG/ML IJ SOLN: 6.2500 mg | INTRAMUSCULAR | Status: DC | PRN

## 2017-02-21 MED ORDER — DEXAMETHASONE SODIUM PHOSPHATE 10 MG/ML IJ SOLN
INTRAMUSCULAR | Status: DC | PRN
  Administered 2017-02-21: 10 mg via INTRAVENOUS

## 2017-02-21 MED ORDER — ONDANSETRON HCL 4 MG/2ML IJ SOLN: 4.0000 mg | Freq: Once | INTRAMUSCULAR | Status: DC | PRN

## 2017-02-21 MED ORDER — LIDOCAINE 2% (20 MG/ML) 5 ML SYRINGE
INTRAMUSCULAR | Status: DC | PRN
  Administered 2017-02-21: 60 mg via INTRAVENOUS

## 2017-02-21 MED ORDER — ONDANSETRON HCL 4 MG/2ML IJ SOLN
INTRAMUSCULAR | Status: DC | PRN
  Administered 2017-02-21: 4 mg via INTRAVENOUS

## 2017-02-21 MED ORDER — LACTATED RINGERS IV SOLN
INTRAVENOUS | Status: DC
  Administered 2017-02-21 (×2): via INTRAVENOUS

## 2017-02-21 MED ORDER — VANCOMYCIN HCL IN DEXTROSE 1-5 GM/200ML-% IV SOLN
1000.0000 mg | INTRAVENOUS | Status: AC
  Administered 2017-02-21: 1000 mg via INTRAVENOUS

## 2017-02-21 MED FILL — HYDROCODON-APAP 5-325: 5-325 | 3 days supply | Qty: 30 | Fill #0

## 2017-02-21 SURGICAL SUPPLY — 33 items
BANDAGE ACE 6X5 VEL STRL LF (GAUZE/BANDAGES/DRESSINGS) ×3 IMPLANT
BLADE CUDA 5.5 (BLADE) IMPLANT
BLADE CUTTER GATOR 3.5 (BLADE) ×3 IMPLANT
BLADE GREAT WHITE 4.2 (BLADE) IMPLANT
BNDG GAUZE ELAST 4 BULKY (GAUZE/BANDAGES/DRESSINGS) ×3 IMPLANT
DRAPE ARTHROSCOPY W/POUCH 114 (DRAPES) ×3 IMPLANT
DRAPE U-SHAPE 47X51 STRL (DRAPES) ×3 IMPLANT
DRSG EMULSION OIL 3X3 NADH (GAUZE/BANDAGES/DRESSINGS) ×3 IMPLANT
DURAPREP 26ML APPLICATOR (WOUND CARE) ×6 IMPLANT
ELECT MENISCUS 165MM 90D (ELECTRODE) IMPLANT
ELECT REM PT RETURN 9FT ADLT (ELECTROSURGICAL)
ELECTRODE REM PT RTRN 9FT ADLT (ELECTROSURGICAL) IMPLANT
GAUZE SPONGE 4X4 12PLY STRL (GAUZE/BANDAGES/DRESSINGS) ×3 IMPLANT
GLOVE BIO SURGEON STRL SZ8 (GLOVE) ×6 IMPLANT
GLOVE BIOGEL PI IND STRL 8 (GLOVE) ×4 IMPLANT
GLOVE BIOGEL PI INDICATOR 8 (GLOVE) ×2
GOWN STRL REUS W/ TWL LRG LVL3 (GOWN DISPOSABLE) ×2 IMPLANT
GOWN STRL REUS W/ TWL XL LVL3 (GOWN DISPOSABLE) ×4 IMPLANT
GOWN STRL REUS W/TWL LRG LVL3 (GOWN DISPOSABLE) ×3
GOWN STRL REUS W/TWL XL LVL3 (GOWN DISPOSABLE) ×6
IV NS IRRIG 3000ML ARTHROMATIC (IV SOLUTION) IMPLANT
KNEE WRAP E Z 3 GEL PACK (MISCELLANEOUS) ×3 IMPLANT
MANIFOLD NEPTUNE II (INSTRUMENTS) IMPLANT
PACK ARTHROSCOPY DSU (CUSTOM PROCEDURE TRAY) ×3 IMPLANT
PACK BASIN DAY SURGERY FS (CUSTOM PROCEDURE TRAY) ×3 IMPLANT
PENCIL BUTTON HOLSTER BLD 10FT (ELECTRODE) IMPLANT
SET ARTHROSCOPY TUBING (MISCELLANEOUS) ×3
SET ARTHROSCOPY TUBING LN (MISCELLANEOUS) ×2 IMPLANT
SHEET MEDIUM DRAPE 40X70 STRL (DRAPES) ×3 IMPLANT
SYR 3ML 18GX1 1/2 (SYRINGE) IMPLANT
TOWEL OR 17X24 6PK STRL BLUE (TOWEL DISPOSABLE) ×3 IMPLANT
TOWEL OR NON WOVEN STRL DISP B (DISPOSABLE) ×3 IMPLANT
WATER STERILE IRR 1000ML POUR (IV SOLUTION) ×3 IMPLANT

## 2017-02-21 NOTE — Anesthesia Postprocedure Evaluation (Signed)
Anesthesia Post Note  Patient: Cynthia [name]  Procedure(s) Performed: Procedure(s) (LRB): KNEE ARTHROSCOPY WITH LATERAL MENISECTOMY (Right) KNEE ARTHROSCOPY WITH MEDIAL MENISECTOMY (Right) CHONDROPLASTY (Right)  Patient location during evaluation: PACU Anesthesia Type: General Level of consciousness: awake and alert Pain management: pain level controlled Vital Signs Assessment: post-procedure vital signs reviewed and stable Respiratory status: spontaneous breathing, nonlabored ventilation, respiratory function stable and patient connected to nasal cannula oxygen Cardiovascular status: blood pressure returned to baseline and stable Postop Assessment: no signs of nausea or vomiting Anesthetic complications: no       Last Vitals:  Vitals:   02/21/17 1415 02/21/17 1430  BP: 135/78 (!) 147/80  Pulse: 87 73  Resp: 17 16  Temp:  36.3 C    Last Pain:  Vitals:   02/21/17 1430  TempSrc:   PainSc: 5                  Jodene Polyak DAVID

## 2017-02-21 NOTE — Anesthesia Procedure Notes (Signed)
Procedure Name: LMA Insertion Date/Time: 02/21/2017 1:09 PM Performed by: Lieutenant Diego Pre-anesthesia Checklist: Patient identified, Emergency Drugs available, Suction available and Patient being monitored Patient Re-evaluated:Patient Re-evaluated prior to inductionOxygen Delivery Method: Circle system utilized Preoxygenation: Pre-oxygenation with 100% oxygen Intubation Type: IV induction Ventilation: Mask ventilation without difficulty LMA: LMA inserted LMA Size: 4.0 Number of attempts: 1 Airway Equipment and Method: Bite block Placement Confirmation: positive ETCO2 and breath sounds checked- equal and bilateral Tube secured with: Tape Dental Injury: Teeth and Oropharynx as per pre-operative assessment

## 2017-02-21 NOTE — Discharge Instructions (Signed)

## 2017-02-21 NOTE — Transfer of Care (Signed)
Immediate Anesthesia Transfer of Care Note  Patient: Cynthia [name]  Procedure(s) Performed: Procedure(s): ARTHROSCOPY KNEE (Right)  Patient Location: PACU  Anesthesia Type:General  Level of Consciousness: awake and alert   Airway & Oxygen Therapy: Patient Spontanous Breathing and Patient connected to face mask oxygen  Post-op Assessment: Report given to RN and Post -op Vital signs reviewed and stable  Post vital signs: Reviewed and stable  Last Vitals:  Vitals:   02/21/17 1119  BP: 135/64  Pulse: 75  Temp: 36.5 C    Last Pain:  Vitals:   02/21/17 1119  TempSrc: Oral  PainSc: 3          Complications: No apparent anesthesia complications

## 2017-02-21 NOTE — Anesthesia Preprocedure Evaluation (Signed)
Anesthesia Evaluation  Patient identified by MRN, date of birth, ID band Patient awake    Reviewed: Allergy & Precautions, NPO status , Patient's Chart, lab work & pertinent test results  Airway Mallampati: I  TM Distance: >3 FB Neck ROM: Full    Dental   Pulmonary    Pulmonary exam normal        Cardiovascular hypertension, Normal cardiovascular exam     Neuro/Psych    GI/Hepatic GERD  Medicated and Controlled,  Endo/Other    Renal/GU      Musculoskeletal   Abdominal   Peds  Hematology   Anesthesia Other Findings   Reproductive/Obstetrics                             Anesthesia Physical Anesthesia Plan  ASA: II  Anesthesia Plan: General   Post-op Pain Management:    Induction: Intravenous  Airway Management Planned: LMA  Additional Equipment:   Intra-op Plan:   Post-operative Plan: Extubation in OR  Informed Consent: I have reviewed the patients History and Physical, chart, labs and discussed the procedure including the risks, benefits and alternatives for the proposed anesthesia with the patient or authorized representative who has indicated his/her understanding and acceptance.     Plan Discussed with: CRNA and Surgeon  Anesthesia Plan Comments:         Anesthesia Quick Evaluation

## 2017-02-21 NOTE — Interval H&P Note (Signed)
History and Physical Interval Note:  02/21/2017 12:53 PM  Cynthia [name]  has presented today for surgery, with the diagnosis of RIGHT KNEE LATERAL MENISCAL TEAR  The various methods of treatment have been discussed with the patient and family. After consideration of risks, benefits and other options for treatment, the patient has consented to  Procedure(s): ARTHROSCOPY KNEE (Right) as a surgical intervention .  The patient's history has been reviewed, patient examined, no change in status, stable for surgery.  I have reviewed the patient's chart and labs.  Questions were answered to the patient's satisfaction.     Stasia Somero G

## 2017-02-21 NOTE — Op Note (Signed)
#  [id number] 

## 2017-02-25 MED FILL — LISINOPRIL-HCTZ 20-12.5 MG: 20-12.5 | 90 days supply | Qty: 90 | Fill #1

## 2017-02-25 MED FILL — VIT D2 1.25 MG (50,000 UNIT: 1.25 MG | 84 days supply | Qty: 12 | Fill #2

## 2017-02-25 NOTE — Op Note (Signed)
Cynthia [name], Cynthia [name]               ACCOUNT NO.:  [id number]  MEDICAL RECORD NO.:  [id number]  LOCATION:                                 FACILITY:  PHYSICIAN:  Monico Blitz. Rhona Raider, M.D.     DATE OF BIRTH:  DATE OF PROCEDURE:  02/21/2017 DATE OF DISCHARGE:                              OPERATIVE REPORT   PREOPERATIVE DIAGNOSES: 1. Right knee torn medial and torn lateral meniscus. 2. Right knee chondromalacia.  POSTOPERATIVE DIAGNOSES: 1. Right knee torn medial and torn lateral meniscus. 2. Right knee chondromalacia.  PROCEDURES: 1. Right knee partial medial and partial lateral meniscectomy. 2. Right knee abrasion chondroplasty.  ANESTHESIA:  General.  SURGEON:  Monico Blitz. Rhona Raider, M.D.  ASSISTANT:  Loni Dolly, PA.  INDICATION FOR PROCEDURE:  The patient is a 66 year old woman with a long history of right knee pain.  This has persisted despite medication, rest, and an injection.  By MRI scan, she has medial and lateral meniscus tears.  She is offered an arthroscopy with continued pain at rest and pain when trying to walk.  Informed operative consent was obtained after discussion of possible complications including reaction to anesthesia and infection.  SUMMARY OF FINDINGS AND PROCEDURE:  Under general anesthesia, an arthroscopy of the right knee was performed.  Suprapatellar pouch was benign while the patellofemoral joint exhibited some focal breakdown across the apex of patella, addressed with chondroplasty, and abrasion to bleeding bone and some small areas.  In the medial compartment, she had some grade 3 and focal grade 4 change across the medial femoral condyle and thorough chondroplasty was done.  She did have a radial tear of the posterior horn of the medial meniscus with a horizontal component.  This was addressed with about 10% partial medial meniscectomy.  ACL was normal.  The lateral compartment had a tiny radial tear consistent with her scan and a tiny partial  lateral meniscectomy was done.  She also had some focal grade 4 change in the lateral tibial plateau and a chondroplasty was done here.  She was injected with the usual agents plus Depo-Medrol and discharged home the same day.  DESCRIPTION OF PROCEDURE:  The patient was taken to the operating suite where general anesthetic was applied without difficulty.  She was positioned supine and prepped and draped in normal sterile fashion. After the administration of IV vancomycin and appropriate time out, an arthroscopy of the right knee was performed through total of 2 portals. Findings were as noted above and procedure consisted of the partial medial and partial lateral meniscectomies done with baskets and shavers followed by abrasion chondroplasty.  The knee was thoroughly irrigated at the end of the case followed by placement of Marcaine with some Depo- Medrol.  Adaptic was applied to the portals followed by dry gauze and loose Ace wrap.  Estimated blood loss and fluids obtained from anesthesia records.  DISPOSITION:  The patient was extubated in the operating room and taken to recovery room in stable condition.  She was to go home same-day and follow up in the office closely.  I will contact her by phone tonight.     Monico Blitz Rhona Raider, M.D.   ______________________________  Monico Blitz Rhona Raider, M.D.    PGD/MEDQ  D:  02/21/2017  T:  02/21/2017  Job:  [id number]

## 2017-02-26 MED FILL — SERTRALINE HCL 50 MG TABLET: 50 | 90 days supply | Qty: 90 | Fill #0

## 2017-02-26 MED FILL — BUPROPION HCL XL 300 MG TAB: 300 | 90 days supply | Qty: 90 | Fill #0

## 2017-02-28 MED FILL — clonazePAM 0.5 MG TABS: 0.5 | 90 days supply | Qty: 180 | Fill #0

## 2017-03-03 DIAGNOSIS — Z9889 Other specified postprocedural states: Secondary | ICD-10-CM | POA: Diagnosis not present

## 2017-03-06 DIAGNOSIS — Z23 Encounter for immunization: Secondary | ICD-10-CM | POA: Diagnosis not present

## 2017-03-06 DIAGNOSIS — E669 Obesity, unspecified: Secondary | ICD-10-CM | POA: Diagnosis not present

## 2017-03-06 DIAGNOSIS — F419 Anxiety disorder, unspecified: Secondary | ICD-10-CM | POA: Diagnosis not present

## 2017-03-06 DIAGNOSIS — E559 Vitamin D deficiency, unspecified: Secondary | ICD-10-CM | POA: Diagnosis not present

## 2017-03-06 DIAGNOSIS — I10 Essential (primary) hypertension: Secondary | ICD-10-CM | POA: Diagnosis not present

## 2017-03-06 DIAGNOSIS — F324 Major depressive disorder, single episode, in partial remission: Secondary | ICD-10-CM | POA: Diagnosis not present

## 2017-03-06 DIAGNOSIS — E7800 Pure hypercholesterolemia, unspecified: Secondary | ICD-10-CM | POA: Diagnosis not present

## 2017-03-10 DIAGNOSIS — M23321 Other meniscus derangements, posterior horn of medial meniscus, right knee: Secondary | ICD-10-CM | POA: Diagnosis not present

## 2017-03-10 DIAGNOSIS — M25661 Stiffness of right knee, not elsewhere classified: Secondary | ICD-10-CM | POA: Diagnosis not present

## 2017-03-10 DIAGNOSIS — M25561 Pain in right knee: Secondary | ICD-10-CM | POA: Diagnosis not present

## 2017-03-10 DIAGNOSIS — Z9889 Other specified postprocedural states: Secondary | ICD-10-CM | POA: Diagnosis not present

## 2017-03-11 MED FILL — NYSTATIN-TRIAMCINOLONE CRM: 100000-0.1 | 15 days supply | Qty: 30 | Fill #3

## 2017-03-24 DIAGNOSIS — M23321 Other meniscus derangements, posterior horn of medial meniscus, right knee: Secondary | ICD-10-CM | POA: Diagnosis not present

## 2017-03-25 DIAGNOSIS — Z9889 Other specified postprocedural states: Secondary | ICD-10-CM | POA: Diagnosis not present

## 2017-03-25 DIAGNOSIS — M23321 Other meniscus derangements, posterior horn of medial meniscus, right knee: Secondary | ICD-10-CM | POA: Diagnosis not present

## 2017-03-25 DIAGNOSIS — M25661 Stiffness of right knee, not elsewhere classified: Secondary | ICD-10-CM | POA: Diagnosis not present

## 2017-03-25 DIAGNOSIS — M25561 Pain in right knee: Secondary | ICD-10-CM | POA: Diagnosis not present

## 2017-03-31 MED FILL — NITROFURANTOIN MCR 100 MG C: 100 | 30 days supply | Qty: 30 | Fill #1

## 2017-04-14 DIAGNOSIS — M23321 Other meniscus derangements, posterior horn of medial meniscus, right knee: Secondary | ICD-10-CM | POA: Diagnosis not present

## 2017-04-14 DIAGNOSIS — Z9889 Other specified postprocedural states: Secondary | ICD-10-CM | POA: Diagnosis not present

## 2017-04-14 DIAGNOSIS — M25661 Stiffness of right knee, not elsewhere classified: Secondary | ICD-10-CM | POA: Diagnosis not present

## 2017-04-14 DIAGNOSIS — M25561 Pain in right knee: Secondary | ICD-10-CM | POA: Diagnosis not present

## 2017-04-14 MED FILL — NYSTATIN-TRIAMCINOLONE CRM: 100000-0.1 | 15 days supply | Qty: 30 | Fill #4

## 2017-04-21 DIAGNOSIS — M25561 Pain in right knee: Secondary | ICD-10-CM | POA: Diagnosis not present

## 2017-05-13 MED FILL — LISINOPRIL-HCTZ 20-12.5 MG: 20-12.5 | 90 days supply | Qty: 90 | Fill #0

## 2017-05-13 MED FILL — SERTRALINE HCL 50 MG TABLET: 50 | 90 days supply | Qty: 90 | Fill #1

## 2017-05-13 MED FILL — CLINDAMYCIN HCL 300 MG CAPS: 300 | 7 days supply | Qty: 21 | Fill #0

## 2017-05-13 MED FILL — NITROFURANTOIN MCR 100 MG C: 100 | 30 days supply | Qty: 30 | Fill #2

## 2017-05-15 DIAGNOSIS — K011 Impacted teeth: Secondary | ICD-10-CM | POA: Diagnosis not present

## 2017-05-15 MED FILL — DEXAMETHASONE 4 MG TABLET: 4 | 3 days supply | Qty: 9 | Fill #0

## 2017-05-15 MED FILL — HYDROCODON-APAP 5-325: 5-325 | 1 days supply | Qty: 5 | Fill #0

## 2017-05-19 DIAGNOSIS — M1711 Unilateral primary osteoarthritis, right knee: Secondary | ICD-10-CM | POA: Diagnosis not present

## 2017-06-03 MED FILL — clonazePAM 0.5 MG TABS: 0.5 | 90 days supply | Qty: 180 | Fill #1

## 2017-06-03 MED FILL — NYSTATIN-TRIAMCINOLONE CRM: 100000-0.1 | 15 days supply | Qty: 30 | Fill #5

## 2017-06-03 MED FILL — BUPROPION HCL XL 300 MG TAB: 300 | 90 days supply | Qty: 90 | Fill #1

## 2017-06-04 DIAGNOSIS — M1711 Unilateral primary osteoarthritis, right knee: Secondary | ICD-10-CM | POA: Diagnosis not present

## 2017-06-04 MED FILL — MELOXICAM 15 MG TABLET: 15 | 30 days supply | Qty: 30 | Fill #0

## 2017-06-11 DIAGNOSIS — M1711 Unilateral primary osteoarthritis, right knee: Secondary | ICD-10-CM | POA: Diagnosis not present

## 2017-06-18 DIAGNOSIS — M1711 Unilateral primary osteoarthritis, right knee: Secondary | ICD-10-CM | POA: Diagnosis not present

## 2017-06-23 DIAGNOSIS — H524 Presbyopia: Secondary | ICD-10-CM | POA: Diagnosis not present

## 2017-07-03 MED FILL — MELOXICAM 15 MG TABLET: 15 | 30 days supply | Qty: 30 | Fill #1

## 2017-07-21 MED FILL — NYSTATIN-TRIAMCINOLONE CRM: 100000-0.1 | 15 days supply | Qty: 30 | Fill #6

## 2017-07-21 MED FILL — NITROFURANTOIN MCR 100 MG C: 100 | 30 days supply | Qty: 30 | Fill #3

## 2017-07-24 MED FILL — SERTRALINE HCL 50 MG TABLET: 50 | 90 days supply | Qty: 90 | Fill #0

## 2017-08-12 MED FILL — traMADol HCL 50 MG TABS: 50 | 15 days supply | Qty: 30 | Fill #0

## 2017-08-18 DIAGNOSIS — M1711 Unilateral primary osteoarthritis, right knee: Secondary | ICD-10-CM | POA: Diagnosis not present

## 2017-08-18 MED FILL — LISINOPRIL-HCTZ 20-12.5 MG: 20-12.5 | 90 days supply | Qty: 90 | Fill #1

## 2017-08-18 MED FILL — NYSTATIN-TRIAMCINOLONE CRM: 100000-0.1 | 15 days supply | Qty: 30 | Fill #1

## 2017-08-29 MED FILL — BUPROPION HCL XL 300 MG TAB: 300 | 90 days supply | Qty: 90 | Fill #0

## 2017-09-01 MED FILL — clonazePAM 0.5 MG TABS: 0.5 | 90 days supply | Qty: 180 | Fill #0

## 2017-09-15 DIAGNOSIS — I10 Essential (primary) hypertension: Secondary | ICD-10-CM | POA: Diagnosis not present

## 2017-09-15 DIAGNOSIS — E559 Vitamin D deficiency, unspecified: Secondary | ICD-10-CM | POA: Diagnosis not present

## 2017-09-15 DIAGNOSIS — E7800 Pure hypercholesterolemia, unspecified: Secondary | ICD-10-CM | POA: Diagnosis not present

## 2017-09-15 DIAGNOSIS — F419 Anxiety disorder, unspecified: Secondary | ICD-10-CM | POA: Diagnosis not present

## 2017-09-15 DIAGNOSIS — F324 Major depressive disorder, single episode, in partial remission: Secondary | ICD-10-CM | POA: Diagnosis not present

## 2017-09-15 DIAGNOSIS — E669 Obesity, unspecified: Secondary | ICD-10-CM | POA: Diagnosis not present

## 2017-09-15 DIAGNOSIS — G4700 Insomnia, unspecified: Secondary | ICD-10-CM | POA: Diagnosis not present

## 2017-09-15 DIAGNOSIS — Z23 Encounter for immunization: Secondary | ICD-10-CM | POA: Diagnosis not present

## 2017-09-15 MED FILL — VIT D2 1.25 MG (50,000 UNIT: 1.25 MG | 84 days supply | Qty: 12 | Fill #0

## 2017-09-18 MED FILL — NITROFURANTOIN MCR 100 MG C: 100 | 30 days supply | Qty: 30 | Fill #0

## 2017-10-02 MED FILL — SERTRALINE HCL 50 MG TABLET: 50 | 90 days supply | Qty: 135 | Fill #0

## 2017-10-06 DIAGNOSIS — Z01419 Encounter for gynecological examination (general) (routine) without abnormal findings: Secondary | ICD-10-CM | POA: Diagnosis not present

## 2017-10-06 DIAGNOSIS — Z1231 Encounter for screening mammogram for malignant neoplasm of breast: Secondary | ICD-10-CM | POA: Diagnosis not present

## 2017-10-06 MED FILL — NYSTATIN-TRIAMCINOLONE CRM: 100000-0.1 | 7 days supply | Qty: 30 | Fill #0

## 2017-10-13 MED FILL — NITROFURANTOIN MONO-MCR 100: 100 | 30 days supply | Qty: 30 | Fill #0

## 2017-10-20 DIAGNOSIS — M1711 Unilateral primary osteoarthritis, right knee: Secondary | ICD-10-CM | POA: Diagnosis not present

## 2017-11-14 DIAGNOSIS — Z23 Encounter for immunization: Secondary | ICD-10-CM | POA: Diagnosis not present

## 2017-12-01 MED FILL — LISINOPRIL-HCTZ 20-12.5 MG: 20-12.5 | 90 days supply | Qty: 90 | Fill #0

## 2017-12-01 MED FILL — BUPROPION HCL XL 300 MG TAB: 300 | 90 days supply | Qty: 90 | Fill #1

## 2017-12-03 MED FILL — clonazePAM 0.5 MG TABS: 0.5 | 90 days supply | Qty: 180 | Fill #0

## 2017-12-10 DIAGNOSIS — M1711 Unilateral primary osteoarthritis, right knee: Secondary | ICD-10-CM | POA: Diagnosis not present

## 2018-01-01 MED FILL — NYSTATIN-TRIAMCINOLONE CRM: 100000-0.1 | 7 days supply | Qty: 30 | Fill #1

## 2018-01-01 MED FILL — SERTRALINE HCL 50 MG TABLET: 50 | 90 days supply | Qty: 135 | Fill #1

## 2018-01-08 DIAGNOSIS — D225 Melanocytic nevi of trunk: Secondary | ICD-10-CM | POA: Diagnosis not present

## 2018-01-08 DIAGNOSIS — L738 Other specified follicular disorders: Secondary | ICD-10-CM | POA: Diagnosis not present

## 2018-01-08 DIAGNOSIS — L821 Other seborrheic keratosis: Secondary | ICD-10-CM | POA: Diagnosis not present

## 2018-01-08 DIAGNOSIS — D2239 Melanocytic nevi of other parts of face: Secondary | ICD-10-CM | POA: Diagnosis not present

## 2018-01-08 DIAGNOSIS — L820 Inflamed seborrheic keratosis: Secondary | ICD-10-CM | POA: Diagnosis not present

## 2018-01-08 DIAGNOSIS — L814 Other melanin hyperpigmentation: Secondary | ICD-10-CM | POA: Diagnosis not present

## 2018-01-08 DIAGNOSIS — D1801 Hemangioma of skin and subcutaneous tissue: Secondary | ICD-10-CM | POA: Diagnosis not present

## 2018-01-08 DIAGNOSIS — L918 Other hypertrophic disorders of the skin: Secondary | ICD-10-CM | POA: Diagnosis not present

## 2018-01-08 DIAGNOSIS — Z85828 Personal history of other malignant neoplasm of skin: Secondary | ICD-10-CM | POA: Diagnosis not present

## 2018-01-12 DIAGNOSIS — M1711 Unilateral primary osteoarthritis, right knee: Secondary | ICD-10-CM | POA: Diagnosis not present

## 2018-01-19 DIAGNOSIS — M1711 Unilateral primary osteoarthritis, right knee: Secondary | ICD-10-CM | POA: Diagnosis not present

## 2018-01-26 DIAGNOSIS — M1711 Unilateral primary osteoarthritis, right knee: Secondary | ICD-10-CM | POA: Diagnosis not present

## 2018-03-04 MED FILL — LISINOPRIL-HCTZ 20-12.5 MG: 20-12.5 | 90 days supply | Qty: 90 | Fill #1

## 2018-03-04 MED FILL — NITROFURANTOIN MCR 100 MG C: 100 | 30 days supply | Qty: 30 | Fill #1

## 2018-03-04 MED FILL — VIT D2 1.25 MG (50,000 UNIT: 1.25 MG | 84 days supply | Qty: 12 | Fill #1

## 2018-03-04 MED FILL — NYSTATIN-TRIAMCINOLONE CRM: 100000-0.1 | 7 days supply | Qty: 30 | Fill #2

## 2018-03-05 MED FILL — BUPROPION HCL XL 300 MG TAB: 300 | 90 days supply | Qty: 90 | Fill #0

## 2018-03-05 MED FILL — clonazePAM 0.5 MG TABS: 0.5 | 90 days supply | Qty: 180 | Fill #0

## 2018-03-17 DIAGNOSIS — M1711 Unilateral primary osteoarthritis, right knee: Secondary | ICD-10-CM | POA: Diagnosis not present

## 2018-03-17 DIAGNOSIS — M1712 Unilateral primary osteoarthritis, left knee: Secondary | ICD-10-CM | POA: Diagnosis not present

## 2018-03-17 MED FILL — DICLOFENAC SODIUM 75 MG TAB: 75 | 30 days supply | Qty: 60 | Fill #0

## 2018-03-18 DIAGNOSIS — F324 Major depressive disorder, single episode, in partial remission: Secondary | ICD-10-CM | POA: Diagnosis not present

## 2018-03-18 DIAGNOSIS — I10 Essential (primary) hypertension: Secondary | ICD-10-CM | POA: Diagnosis not present

## 2018-03-18 DIAGNOSIS — E559 Vitamin D deficiency, unspecified: Secondary | ICD-10-CM | POA: Diagnosis not present

## 2018-03-18 DIAGNOSIS — E7800 Pure hypercholesterolemia, unspecified: Secondary | ICD-10-CM | POA: Diagnosis not present

## 2018-03-18 DIAGNOSIS — E2839 Other primary ovarian failure: Secondary | ICD-10-CM | POA: Diagnosis not present

## 2018-03-18 DIAGNOSIS — F419 Anxiety disorder, unspecified: Secondary | ICD-10-CM | POA: Diagnosis not present

## 2018-03-18 MED FILL — SERTRALINE HCL 100 MG TAB: 100 | 90 days supply | Qty: 90 | Fill #0

## 2018-03-25 DIAGNOSIS — R7301 Impaired fasting glucose: Secondary | ICD-10-CM | POA: Diagnosis not present

## 2018-04-21 DIAGNOSIS — E2839 Other primary ovarian failure: Secondary | ICD-10-CM | POA: Diagnosis not present

## 2018-04-21 DIAGNOSIS — M810 Age-related osteoporosis without current pathological fracture: Secondary | ICD-10-CM | POA: Diagnosis not present

## 2018-04-29 DIAGNOSIS — M1711 Unilateral primary osteoarthritis, right knee: Secondary | ICD-10-CM | POA: Diagnosis not present

## 2018-04-29 MED FILL — MELOXICAM 15 MG TABLET: 15 | 30 days supply | Qty: 30 | Fill #0

## 2018-04-30 MED FILL — traMADol HCL 50 MG TABS: 50 | 13 days supply | Qty: 40 | Fill #0

## 2018-05-01 ENCOUNTER — Other Ambulatory Visit: Payer: Self-pay | Admitting: Orthopaedic Surgery

## 2018-05-01 DIAGNOSIS — G8929 Other chronic pain: Secondary | ICD-10-CM

## 2018-05-01 DIAGNOSIS — M25562 Pain in left knee: Principal | ICD-10-CM

## 2018-05-06 DIAGNOSIS — M810 Age-related osteoporosis without current pathological fracture: Secondary | ICD-10-CM | POA: Diagnosis not present

## 2018-05-06 DIAGNOSIS — F324 Major depressive disorder, single episode, in partial remission: Secondary | ICD-10-CM | POA: Diagnosis not present

## 2018-05-06 MED FILL — ALENDRONATE NA 70 MG TAB: 70 | 28 days supply | Qty: 4 | Fill #0

## 2018-05-06 MED FILL — SERTRALINE HCL 100 MG TAB: 100 | 30 days supply | Qty: 30 | Fill #0

## 2018-05-09 ENCOUNTER — Ambulatory Visit
Admission: RE | Admit: 2018-05-09 | Discharge: 2018-05-09 | Disposition: A | Discharge status: Home / Self Care | Payer: 59 | Source: Ambulatory Visit | Attending: Orthopaedic Surgery | Admitting: Orthopaedic Surgery

## 2018-05-09 DIAGNOSIS — M25562 Pain in left knee: Principal | ICD-10-CM

## 2018-05-09 DIAGNOSIS — M23222 Derangement of posterior horn of medial meniscus due to old tear or injury, left knee: Secondary | ICD-10-CM | POA: Diagnosis not present

## 2018-05-09 DIAGNOSIS — G8929 Other chronic pain: Secondary | ICD-10-CM

## 2018-05-25 DIAGNOSIS — M25562 Pain in left knee: Secondary | ICD-10-CM | POA: Diagnosis not present

## 2018-05-25 MED FILL — MELOXICAM 15 MG TABLET: 15 | 30 days supply | Qty: 30 | Fill #0

## 2018-06-02 MED FILL — ALENDRONATE NA 70 MG TAB: 70 | 28 days supply | Qty: 4 | Fill #1

## 2018-06-02 MED FILL — NITROFURANTOIN MCR 100 MG C: 100 | 30 days supply | Qty: 30 | Fill #2

## 2018-06-02 MED FILL — NYSTATIN-TRIAMCINOLONE CRM: 100000-0.1 | 7 days supply | Qty: 30 | Fill #3

## 2018-06-02 MED FILL — SERTRALINE HCL 100 MG TAB: 100 | 90 days supply | Qty: 90 | Fill #1

## 2018-06-03 MED FILL — BuPROPion HCL ER (XL) 300 M: 300 | 90 days supply | Qty: 90 | Fill #0

## 2018-06-03 MED FILL — LISINOPRIL-HCTZ 20-12.5 MG: 20-12.5 | 90 days supply | Qty: 90 | Fill #0

## 2018-06-04 MED FILL — clonazePAM 0.5 MG TABS: 0.5 | 90 days supply | Qty: 180 | Fill #0

## 2018-06-05 MED FILL — VIT D2 1.25 MG (50,000 UNIT: 1.25 MG | 84 days supply | Qty: 12 | Fill #0

## 2018-06-29 MED FILL — NYSTATIN-TRIAMCINOLONE CRM: 100000-0.1 | 7 days supply | Qty: 30 | Fill #4

## 2018-06-29 MED FILL — ALENDRONATE NA 70 MG TAB: 70 | 28 days supply | Qty: 4 | Fill #2

## 2018-07-02 DIAGNOSIS — H524 Presbyopia: Secondary | ICD-10-CM | POA: Diagnosis not present

## 2018-07-02 DIAGNOSIS — H5213 Myopia, bilateral: Secondary | ICD-10-CM | POA: Diagnosis not present

## 2018-07-02 DIAGNOSIS — H52202 Unspecified astigmatism, left eye: Secondary | ICD-10-CM | POA: Diagnosis not present

## 2018-07-02 DIAGNOSIS — Z961 Presence of intraocular lens: Secondary | ICD-10-CM | POA: Diagnosis not present

## 2018-07-08 MED FILL — MELOXICAM 15 MG TABLET: 15 | 30 days supply | Qty: 30 | Fill #1

## 2018-07-10 MED FILL — ACETAMINOPHEN/COD #3 TABLET: 300-30 | 15 days supply | Qty: 30 | Fill #0

## 2018-07-10 MED FILL — CELECOXIB 200 MG CAP: 200 | 30 days supply | Qty: 30 | Fill #0

## 2018-07-20 DIAGNOSIS — M25562 Pain in left knee: Secondary | ICD-10-CM | POA: Diagnosis not present

## 2018-07-20 DIAGNOSIS — M25561 Pain in right knee: Secondary | ICD-10-CM | POA: Diagnosis not present

## 2018-07-24 DIAGNOSIS — Z23 Encounter for immunization: Secondary | ICD-10-CM | POA: Diagnosis not present

## 2018-07-27 MED FILL — ALENDRONATE NA 70 MG TAB: 70 | 28 days supply | Qty: 4 | Fill #3

## 2018-08-12 MED FILL — ACETAMINOPHEN/COD #3 TABLET: 300-30 | 15 days supply | Qty: 30 | Fill #0

## 2018-08-18 MED FILL — NYSTATIN-TRIAMCINOLONE CRM: 100000-0.1 | 7 days supply | Qty: 30 | Fill #5

## 2018-08-18 MED FILL — ALENDRONATE NA 70 MG TAB: 70 | 28 days supply | Qty: 4 | Fill #4

## 2018-08-19 DIAGNOSIS — M1711 Unilateral primary osteoarthritis, right knee: Secondary | ICD-10-CM | POA: Diagnosis not present

## 2018-08-19 DIAGNOSIS — M1712 Unilateral primary osteoarthritis, left knee: Secondary | ICD-10-CM | POA: Diagnosis not present

## 2018-08-26 DIAGNOSIS — M1712 Unilateral primary osteoarthritis, left knee: Secondary | ICD-10-CM | POA: Diagnosis not present

## 2018-08-26 DIAGNOSIS — M1711 Unilateral primary osteoarthritis, right knee: Secondary | ICD-10-CM | POA: Diagnosis not present

## 2018-08-31 MED FILL — buPROPion HCL ER (XL) 300 M: 300 | 90 days supply | Qty: 90 | Fill #1

## 2018-08-31 MED FILL — SERTRALINE HCL 100 MG TAB: 100 | 30 days supply | Qty: 30 | Fill #1

## 2018-08-31 MED FILL — LISINOPRIL-HCTZ 20-12.5 MG: 20-12.5 | 90 days supply | Qty: 90 | Fill #1

## 2018-09-02 DIAGNOSIS — M1711 Unilateral primary osteoarthritis, right knee: Secondary | ICD-10-CM | POA: Diagnosis not present

## 2018-09-02 DIAGNOSIS — M1712 Unilateral primary osteoarthritis, left knee: Secondary | ICD-10-CM | POA: Diagnosis not present

## 2018-09-02 MED FILL — clonazePAM 0.5 MG TABS: 0.5 | 90 days supply | Qty: 180 | Fill #0

## 2018-09-02 MED FILL — CELECOXIB 200 MG CAP: 200 | 30 days supply | Qty: 30 | Fill #0

## 2018-09-17 DIAGNOSIS — I10 Essential (primary) hypertension: Secondary | ICD-10-CM | POA: Diagnosis not present

## 2018-09-17 DIAGNOSIS — F419 Anxiety disorder, unspecified: Secondary | ICD-10-CM | POA: Diagnosis not present

## 2018-09-17 DIAGNOSIS — F324 Major depressive disorder, single episode, in partial remission: Secondary | ICD-10-CM | POA: Diagnosis not present

## 2018-09-17 DIAGNOSIS — M810 Age-related osteoporosis without current pathological fracture: Secondary | ICD-10-CM | POA: Diagnosis not present

## 2018-09-17 DIAGNOSIS — E7800 Pure hypercholesterolemia, unspecified: Secondary | ICD-10-CM | POA: Diagnosis not present

## 2018-09-17 DIAGNOSIS — R7303 Prediabetes: Secondary | ICD-10-CM | POA: Diagnosis not present

## 2018-09-17 MED FILL — ALENDRONATE NA 70 MG TAB: 70 | 28 days supply | Qty: 4 | Fill #5

## 2018-09-28 MED FILL — SERTRALINE HCL 100 MG TAB: 100 | 90 days supply | Qty: 90 | Fill #0

## 2018-09-28 MED FILL — VIT D2 1.25 MG (50,000 UNIT: 1.25 MG | 84 days supply | Qty: 12 | Fill #1

## 2018-10-05 DIAGNOSIS — M1711 Unilateral primary osteoarthritis, right knee: Secondary | ICD-10-CM | POA: Diagnosis not present

## 2018-10-05 DIAGNOSIS — M1712 Unilateral primary osteoarthritis, left knee: Secondary | ICD-10-CM | POA: Diagnosis not present

## 2018-10-20 DIAGNOSIS — Z1231 Encounter for screening mammogram for malignant neoplasm of breast: Secondary | ICD-10-CM | POA: Diagnosis not present

## 2018-10-20 DIAGNOSIS — Z01419 Encounter for gynecological examination (general) (routine) without abnormal findings: Secondary | ICD-10-CM | POA: Diagnosis not present

## 2018-10-20 DIAGNOSIS — Z124 Encounter for screening for malignant neoplasm of cervix: Secondary | ICD-10-CM | POA: Diagnosis not present

## 2018-10-20 MED FILL — TRIAMCINOLONE 0.1% CREAM: 0.1 | 15 days supply | Qty: 30 | Fill #0

## 2018-10-20 MED FILL — NITROFURANTOIN MONO-MCR 100: 100 | 30 days supply | Qty: 30 | Fill #0

## 2018-10-26 MED FILL — ALENDRONATE NA 70 MG TAB: 70 | 28 days supply | Qty: 4 | Fill #6

## 2018-10-29 MED FILL — CLINDAMYCIN HCL 150 MG CAPS: 150 | 6 days supply | Qty: 25 | Fill #0

## 2018-11-18 MED FILL — LISINOPRIL-HCTZ 20-12.5 MG: 20-12.5 | 90 days supply | Qty: 90 | Fill #0

## 2018-11-30 MED FILL — ALENDRONATE NA 70 MG TAB: 70 | 28 days supply | Qty: 4 | Fill #7 | Status: TO

## 2018-12-02 MED FILL — clonazePAM 0.5 MG TABS: 0.5 | 90 days supply | Qty: 180 | Fill #0

## 2018-12-02 MED FILL — CLINDAMYCIN HCL 150 MG CAPS: 150 | 1 days supply | Qty: 4 | Fill #0

## 2018-12-02 MED FILL — buPROPion HCL ER (XL) 300 M: 300 | 90 days supply | Qty: 90 | Fill #0

## 2018-12-04 MED FILL — CLINDAMYCIN HCL 150 MG CAPS: 150 | 7 days supply | Qty: 21 | Fill #0

## 2018-12-04 MED FILL — HYDROCODON-APAP 10-325: 10-325 | 3 days supply | Qty: 12 | Fill #0

## 2018-12-10 MED FILL — SERTRALINE HCL 100 MG TAB: 100 | 90 days supply | Qty: 90 | Fill #1

## 2018-12-16 DIAGNOSIS — M1712 Unilateral primary osteoarthritis, left knee: Secondary | ICD-10-CM | POA: Diagnosis not present

## 2018-12-16 DIAGNOSIS — M1711 Unilateral primary osteoarthritis, right knee: Secondary | ICD-10-CM | POA: Diagnosis not present

## 2018-12-16 DIAGNOSIS — M1990 Unspecified osteoarthritis, unspecified site: Secondary | ICD-10-CM | POA: Diagnosis not present

## 2018-12-22 IMAGING — MR MR KNEE*L* W/O CM
4 of 6 series · 22 of 40 positions shown · non-contrast
Comparison: None.

CLINICAL DATA: Diffuse right knee pain for 2 years without known
injury or surgery. Injection 2 months ago helped for 2 weeks.
Sitting for too long makes the pain worse.

EXAM:
MRI OF THE LEFT KNEE WITHOUT CONTRAST
TECHNIQUE: Multiplanar, multisequence MR imaging of the knee was performed. No
intravenous contrast was administered.

[Series 5: T2 fat-sat · oblique · 4.0mm · 0.59mm/px · 6 of 29 slices shown (1 of 2)]
[im 1/29]
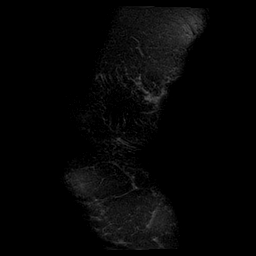
[im 6/29]
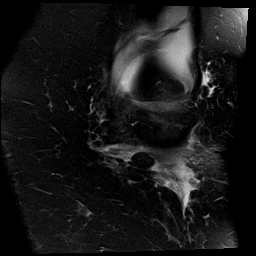
[im 12/29]
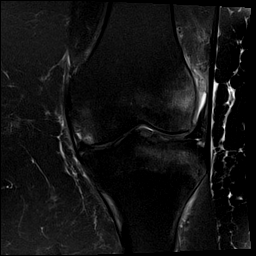
[im 17/29]
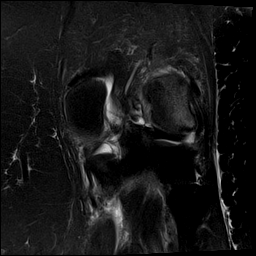
[im 23/29]
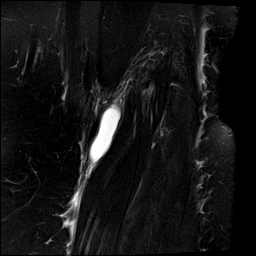
[im 29/29]
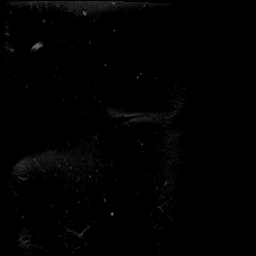

[Series 6: T1 · oblique · 4.0mm · 0.29mm/px · 3 of 29 slices shown]
[im 6/29]
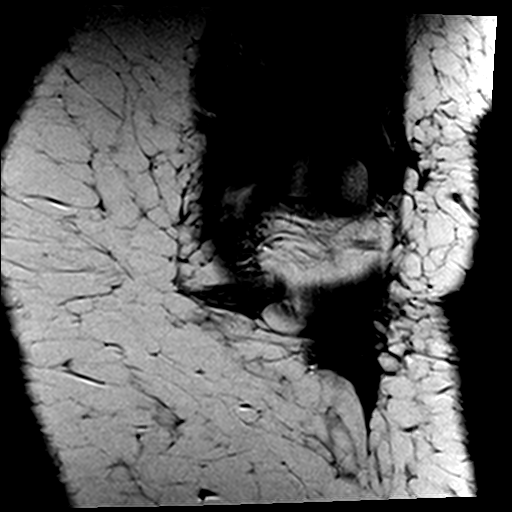
[im 17/29]
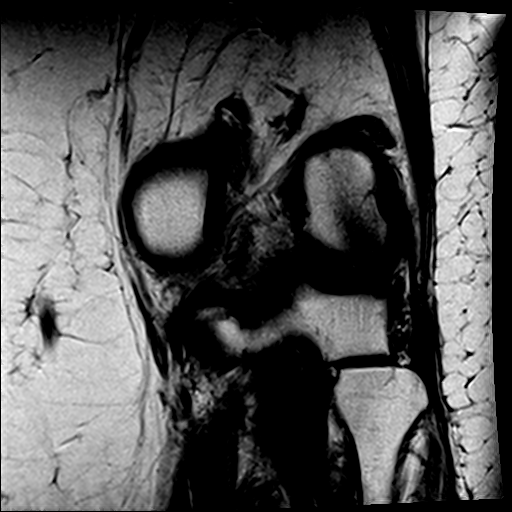
[im 29/29]
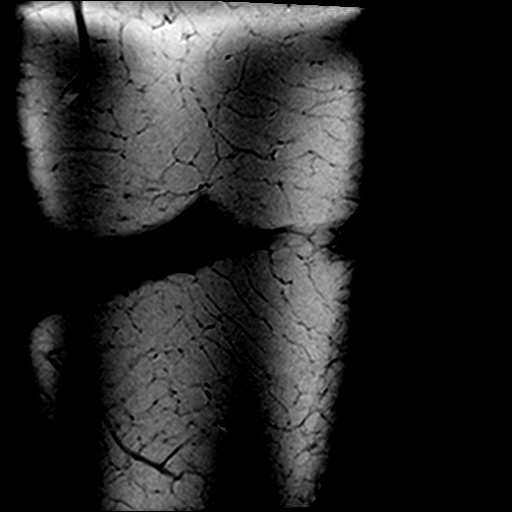

[Series 8: PD fat-sat · oblique · 3.0mm · 0.29mm/px · 7 of 30 slices shown]
[im 1/30]
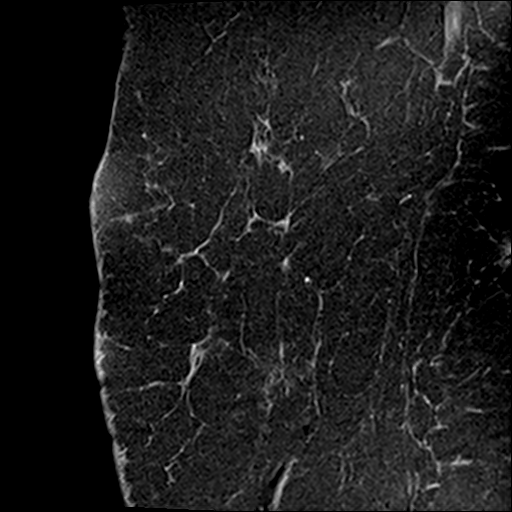
[im 5/30]
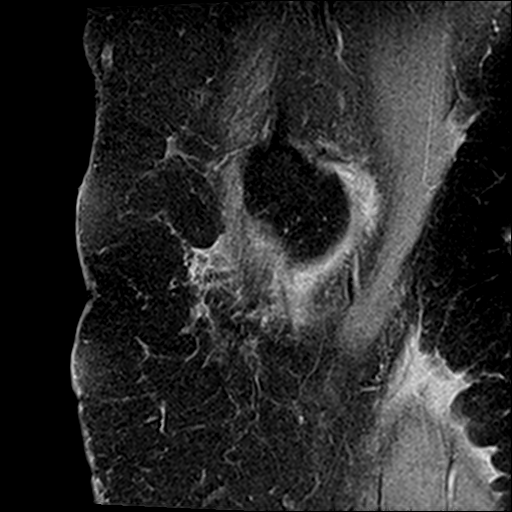
[im 10/30]
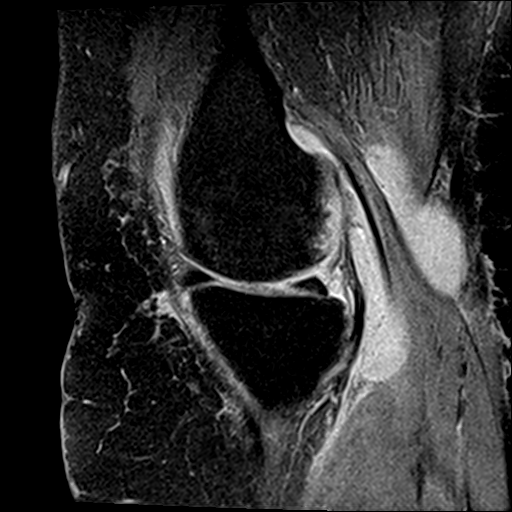
[im 15/30]
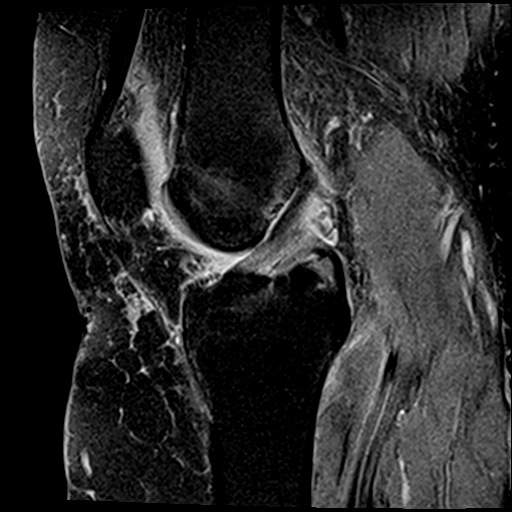
[im 20/30]
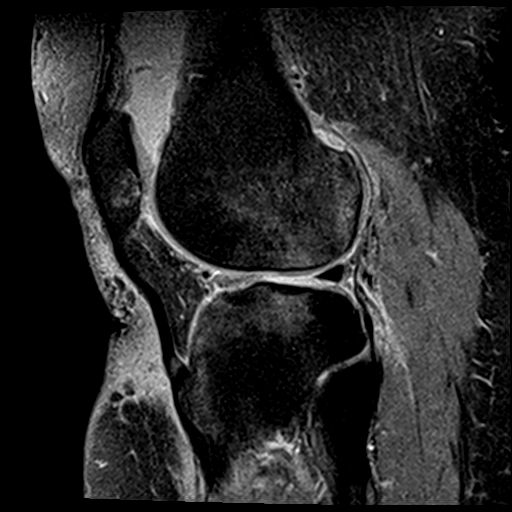
[im 25/30]
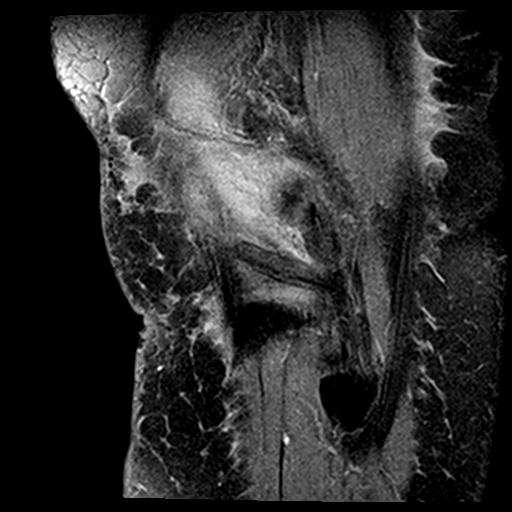
[im 30/30]
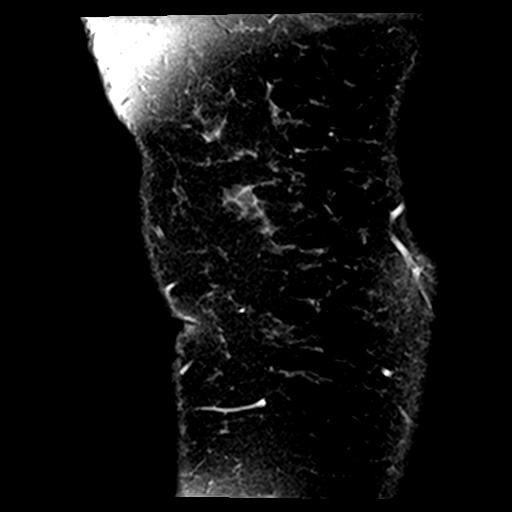

[Series 9: T2 fat-sat · oblique · 3.0mm · 0.29mm/px · 6 of 30 slices shown (2 of 2)]
[im 1/30]
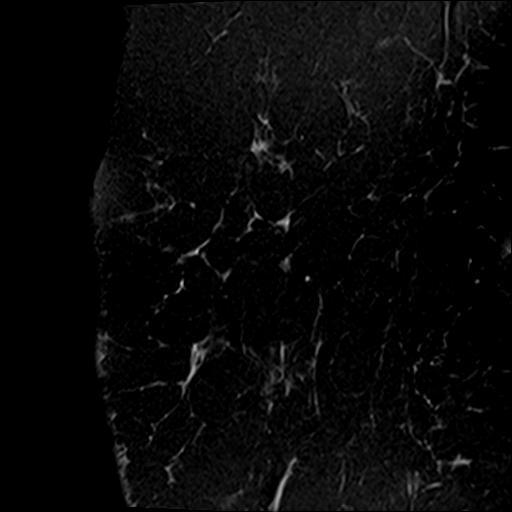
[im 5/30]
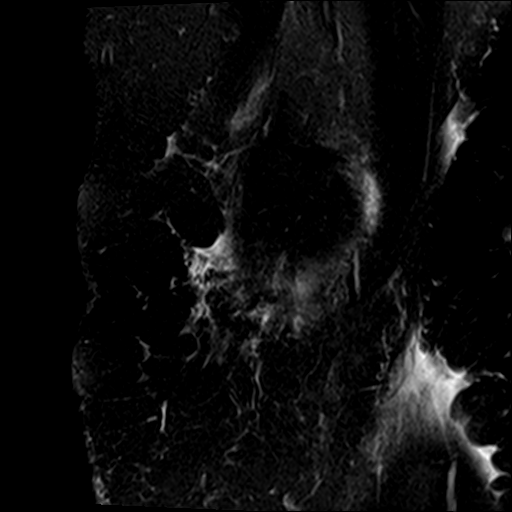
[im 10/30]
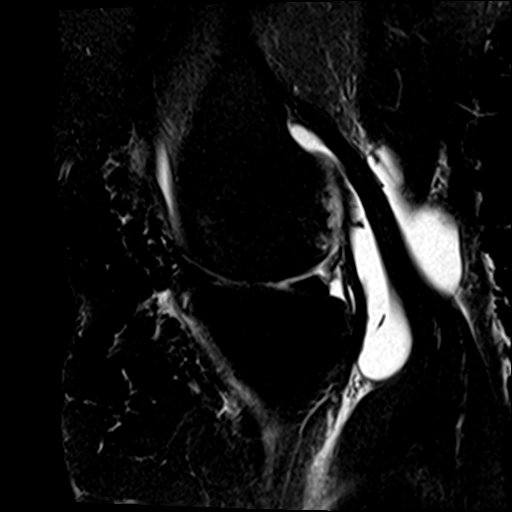
[im 15/30]
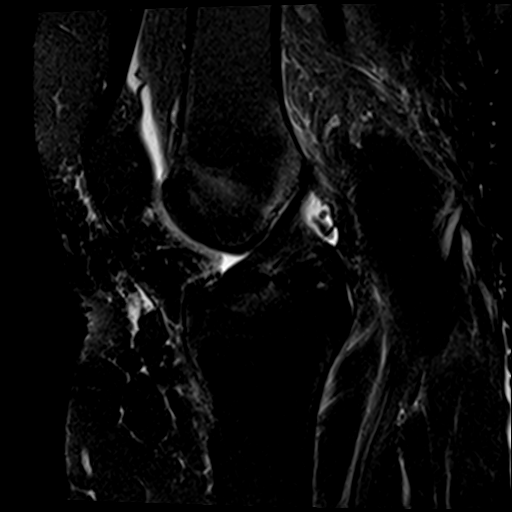
[im 20/30]
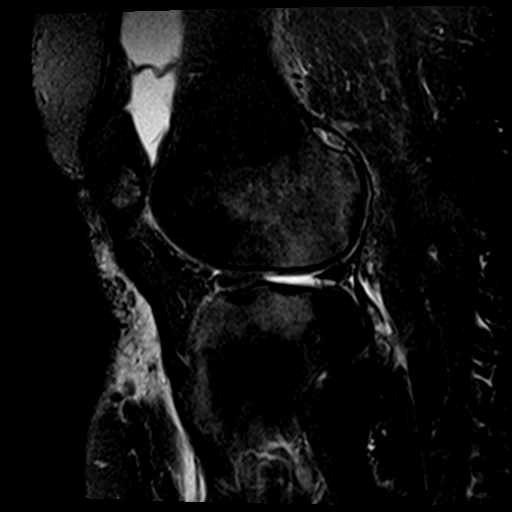
[im 25/30]
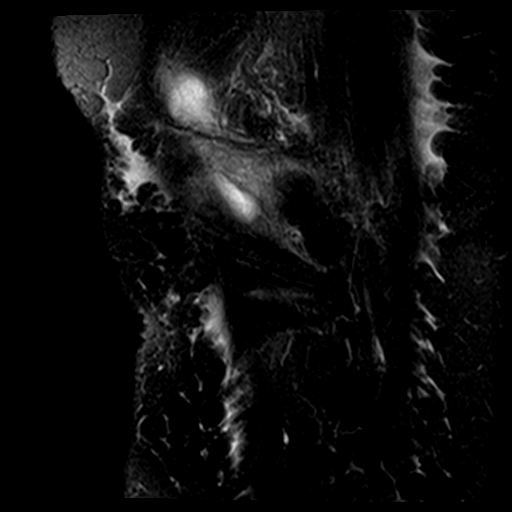

[22 of 40 positions shown; findings below may reference images not displayed]

FINDINGS: MENISCI

Medial meniscus: Inferior articular surfacing tear of the body and
posterior horn of the medial meniscus, series [DATE] and series
9/images 8 and 9.

Lateral meniscus: Macerated appearance of the anterior horn of the
lateral meniscus with truncated free edge tears of the body, series
[DATE] and series [DATE] for example.

LIGAMENTS

Cruciates:  Intact ACL and PCL.

Collaterals: Intact MCL.  Grade 1 sprain of the proximal LCL.

CARTILAGE

Patellofemoral: Fissuring of the lateral patellar cartilage likely
full-thickness resulting in subchondral cystic change and edema
noted deep to the lateral patellar facet. The cartilage overlying
the median ridge and medial patellar facet appears intact. No
trochlear cartilage defects.

Medial: Fissuring of the medial tibial cartilage, "crab meat" in
appearance, series [DATE]. Partial chondral thinning of the medial
femoral condylar cartilage without focal chondral defect.

Lateral: Mild chondral thinning without focal chondral defect of the
lateral femorotibial compartment.

Joint: Moderate suprapatellar joint effusion with thick
suprapatellar plica. Trace fluid in the deep infrapatellar bursa.
Intact Hoffa's fat pad.

Popliteal Fossa: Large popliteal cyst measuring up to 8 cm in length
with components measuring between 15 and 16 mm in AP thickness.
Intact popliteus.

Extensor Mechanism: Intact extensor mechanism tendons. Patellar
retinacula and medial patellofemoral ligament appear intact.

Bones: Microtrabecular fractures believed to account for marrow
abnormalities of the lateral femoral condyle, lateral tibial plateau
and anteromedial femoral condyle in the absence of known trauma.

Other: None
IMPRESSION: 1. Inferior articular surfacing tear of the body and posterior horn
of the medial meniscus.
2. Macerated appearance of the anterior horn with truncated free
edge tears of the body.
3. Grade 1 sprain of the LCL.
4. Marrow edema of the lateral femoral condyle, lateral tibial
plateau and anteromedial femoral condyle likely representing
microtrabecular fractures.
5. Large popliteal cyst with moderate suprapatellar joint effusion.
Thickened suprapatellar plica.

## 2018-12-23 MED FILL — ALENDRONATE NA 70 MG TAB: 70 | 28 days supply | Qty: 4 | Fill #0

## 2018-12-28 MED FILL — NITROFURANTOIN MONO-MCR 100: 100 | 15 days supply | Qty: 30 | Fill #0

## 2018-12-28 MED FILL — NYSTATIN-TRIAMCINOLONE CRM: 100000-0.1 | 15 days supply | Qty: 30 | Fill #0

## 2019-01-01 MED FILL — CELECOXIB 200 MG CAPSULE: 200 | 30 days supply | Qty: 30 | Fill #0

## 2019-01-18 DIAGNOSIS — M1712 Unilateral primary osteoarthritis, left knee: Secondary | ICD-10-CM | POA: Diagnosis not present

## 2019-01-18 DIAGNOSIS — M1711 Unilateral primary osteoarthritis, right knee: Secondary | ICD-10-CM | POA: Diagnosis not present

## 2019-01-20 DIAGNOSIS — D225 Melanocytic nevi of trunk: Secondary | ICD-10-CM | POA: Diagnosis not present

## 2019-01-20 DIAGNOSIS — D1801 Hemangioma of skin and subcutaneous tissue: Secondary | ICD-10-CM | POA: Diagnosis not present

## 2019-01-20 DIAGNOSIS — L821 Other seborrheic keratosis: Secondary | ICD-10-CM | POA: Diagnosis not present

## 2019-01-20 DIAGNOSIS — L738 Other specified follicular disorders: Secondary | ICD-10-CM | POA: Diagnosis not present

## 2019-01-20 DIAGNOSIS — L218 Other seborrheic dermatitis: Secondary | ICD-10-CM | POA: Diagnosis not present

## 2019-01-20 DIAGNOSIS — Z85828 Personal history of other malignant neoplasm of skin: Secondary | ICD-10-CM | POA: Diagnosis not present

## 2019-01-20 MED FILL — KETOCONAZOLE 2% CREAM: 2 | 30 days supply | Qty: 30 | Fill #0

## 2019-01-20 MED FILL — DESONIDE 0.05% OINTMENT: 0.05 | 30 days supply | Qty: 60 | Fill #0

## 2019-01-20 MED FILL — CLOBETASOL PROPIONATE 0.05: 0.05 | 30 days supply | Qty: 50 | Fill #0

## 2019-01-26 MED FILL — ALENDRONATE NA 70 MG TAB: 70 | 28 days supply | Qty: 4 | Fill #1

## 2019-01-26 MED FILL — CELECOXIB 200 MG CAPSULE: 200 | 30 days supply | Qty: 30 | Fill #1

## 2019-02-23 MED FILL — CELECOXIB 200 MG CAP: 200 | 30 days supply | Qty: 30 | Fill #2

## 2019-02-23 MED FILL — LISINOPRIL-HCTZ 20-12.5 MG: 20-12.5 | 90 days supply | Qty: 90 | Fill #1

## 2019-02-23 MED FILL — buPROPion HCL ER (XL) 300 M: 300 | 90 days supply | Qty: 90 | Fill #1

## 2019-02-23 MED FILL — ALENDRONATE NA 70 MG TAB: 70 | 28 days supply | Qty: 4 | Fill #2

## 2019-03-01 MED FILL — NITROFURANTOIN MONO-MCR 100: 100 | 15 days supply | Qty: 30 | Fill #1

## 2019-03-01 MED FILL — clonazePAM 0.5 MG TABS: 0.5 | 90 days supply | Qty: 180 | Fill #1

## 2019-03-09 MED FILL — SERTRALINE HCL 100 MG TAB: 100 | 90 days supply | Qty: 90 | Fill #0

## 2019-03-10 DIAGNOSIS — M1712 Unilateral primary osteoarthritis, left knee: Secondary | ICD-10-CM | POA: Diagnosis not present

## 2019-03-10 DIAGNOSIS — M1711 Unilateral primary osteoarthritis, right knee: Secondary | ICD-10-CM | POA: Diagnosis not present

## 2019-03-17 DIAGNOSIS — M1712 Unilateral primary osteoarthritis, left knee: Secondary | ICD-10-CM | POA: Diagnosis not present

## 2019-03-17 DIAGNOSIS — M1711 Unilateral primary osteoarthritis, right knee: Secondary | ICD-10-CM | POA: Diagnosis not present

## 2019-03-23 DIAGNOSIS — M810 Age-related osteoporosis without current pathological fracture: Secondary | ICD-10-CM | POA: Diagnosis not present

## 2019-03-23 DIAGNOSIS — I10 Essential (primary) hypertension: Secondary | ICD-10-CM | POA: Diagnosis not present

## 2019-03-23 DIAGNOSIS — E7800 Pure hypercholesterolemia, unspecified: Secondary | ICD-10-CM | POA: Diagnosis not present

## 2019-03-23 DIAGNOSIS — F324 Major depressive disorder, single episode, in partial remission: Secondary | ICD-10-CM | POA: Diagnosis not present

## 2019-03-23 DIAGNOSIS — G4700 Insomnia, unspecified: Secondary | ICD-10-CM | POA: Diagnosis not present

## 2019-03-24 DIAGNOSIS — M1711 Unilateral primary osteoarthritis, right knee: Secondary | ICD-10-CM | POA: Diagnosis not present

## 2019-03-24 DIAGNOSIS — M1712 Unilateral primary osteoarthritis, left knee: Secondary | ICD-10-CM | POA: Diagnosis not present

## 2019-03-29 DIAGNOSIS — M170 Bilateral primary osteoarthritis of knee: Secondary | ICD-10-CM | POA: Diagnosis not present

## 2019-03-30 DIAGNOSIS — E7800 Pure hypercholesterolemia, unspecified: Secondary | ICD-10-CM | POA: Diagnosis not present

## 2019-03-30 DIAGNOSIS — M810 Age-related osteoporosis without current pathological fracture: Secondary | ICD-10-CM | POA: Diagnosis not present

## 2019-03-30 DIAGNOSIS — I10 Essential (primary) hypertension: Secondary | ICD-10-CM | POA: Diagnosis not present

## 2019-04-05 DIAGNOSIS — M170 Bilateral primary osteoarthritis of knee: Secondary | ICD-10-CM | POA: Diagnosis not present

## 2019-04-07 DIAGNOSIS — M170 Bilateral primary osteoarthritis of knee: Secondary | ICD-10-CM | POA: Diagnosis not present

## 2019-04-10 MED FILL — ALENDRONATE NA 70 MG TAB: 70 | 28 days supply | Qty: 4 | Fill #3

## 2019-04-12 DIAGNOSIS — M170 Bilateral primary osteoarthritis of knee: Secondary | ICD-10-CM | POA: Diagnosis not present

## 2019-04-14 DIAGNOSIS — M170 Bilateral primary osteoarthritis of knee: Secondary | ICD-10-CM | POA: Diagnosis not present

## 2019-04-19 DIAGNOSIS — M1712 Unilateral primary osteoarthritis, left knee: Secondary | ICD-10-CM | POA: Diagnosis not present

## 2019-04-19 DIAGNOSIS — M1990 Unspecified osteoarthritis, unspecified site: Secondary | ICD-10-CM | POA: Diagnosis not present

## 2019-04-19 DIAGNOSIS — M1711 Unilateral primary osteoarthritis, right knee: Secondary | ICD-10-CM | POA: Diagnosis not present

## 2019-04-19 MED FILL — CELECOXIB 200 MG CAP: 200 | 30 days supply | Qty: 30 | Fill #0

## 2019-04-21 DIAGNOSIS — M170 Bilateral primary osteoarthritis of knee: Secondary | ICD-10-CM | POA: Diagnosis not present

## 2019-04-22 DIAGNOSIS — M170 Bilateral primary osteoarthritis of knee: Secondary | ICD-10-CM | POA: Diagnosis not present

## 2019-04-26 DIAGNOSIS — M170 Bilateral primary osteoarthritis of knee: Secondary | ICD-10-CM | POA: Diagnosis not present

## 2019-04-28 DIAGNOSIS — M170 Bilateral primary osteoarthritis of knee: Secondary | ICD-10-CM | POA: Diagnosis not present

## 2019-05-04 MED FILL — NITROFURANTOIN MONO-MCR 100: 100 | 15 days supply | Qty: 30 | Fill #2

## 2019-05-05 MED FILL — ALENDRONATE NA 70 MG TAB: 70 | 28 days supply | Qty: 4 | Fill #0

## 2019-05-13 DIAGNOSIS — M1711 Unilateral primary osteoarthritis, right knee: Secondary | ICD-10-CM | POA: Diagnosis not present

## 2019-05-13 DIAGNOSIS — M1712 Unilateral primary osteoarthritis, left knee: Secondary | ICD-10-CM | POA: Diagnosis not present

## 2019-05-13 MED FILL — CELECOXIB 200 MG CAP: 200 | 30 days supply | Qty: 30 | Fill #0

## 2019-05-24 ENCOUNTER — Inpatient Hospital Stay (HOSPITAL_COMMUNITY)
Admission: EM | Admit: 2019-05-24 | Discharge: 2019-05-25 | DRG: 287 | Disposition: A | Discharge status: Home / Self Care | Payer: Medicare Other | Attending: Internal Medicine | Admitting: Internal Medicine

## 2019-05-24 ENCOUNTER — Emergency Department (HOSPITAL_COMMUNITY): Payer: Medicare Other

## 2019-05-24 ENCOUNTER — Other Ambulatory Visit: Payer: Self-pay

## 2019-05-24 ENCOUNTER — Inpatient Hospital Stay (HOSPITAL_COMMUNITY): Payer: Medicare Other

## 2019-05-24 ENCOUNTER — Encounter (HOSPITAL_COMMUNITY)
Admission: EM | Disposition: A | Discharge status: Home / Self Care | Payer: Self-pay | Source: Home / Self Care | Attending: Internal Medicine

## 2019-05-24 ENCOUNTER — Encounter (HOSPITAL_COMMUNITY): Payer: Self-pay | Admitting: Emergency Medicine

## 2019-05-24 ENCOUNTER — Other Ambulatory Visit (HOSPITAL_COMMUNITY): Payer: Medicare Other

## 2019-05-24 DIAGNOSIS — I361 Nonrheumatic tricuspid (valve) insufficiency: Secondary | ICD-10-CM

## 2019-05-24 DIAGNOSIS — I248 Other forms of acute ischemic heart disease: Secondary | ICD-10-CM

## 2019-05-24 DIAGNOSIS — K219 Gastro-esophageal reflux disease without esophagitis: Secondary | ICD-10-CM | POA: Diagnosis not present

## 2019-05-24 DIAGNOSIS — Z79899 Other long term (current) drug therapy: Secondary | ICD-10-CM

## 2019-05-24 DIAGNOSIS — I10 Essential (primary) hypertension: Secondary | ICD-10-CM | POA: Diagnosis not present

## 2019-05-24 DIAGNOSIS — F419 Anxiety disorder, unspecified: Secondary | ICD-10-CM | POA: Diagnosis not present

## 2019-05-24 DIAGNOSIS — I471 Supraventricular tachycardia, unspecified: Secondary | ICD-10-CM | POA: Diagnosis present

## 2019-05-24 DIAGNOSIS — I358 Other nonrheumatic aortic valve disorders: Secondary | ICD-10-CM

## 2019-05-24 DIAGNOSIS — I25119 Atherosclerotic heart disease of native coronary artery with unspecified angina pectoris: Secondary | ICD-10-CM | POA: Diagnosis not present

## 2019-05-24 DIAGNOSIS — I213 ST elevation (STEMI) myocardial infarction of unspecified site: Secondary | ICD-10-CM

## 2019-05-24 DIAGNOSIS — J9811 Atelectasis: Secondary | ICD-10-CM | POA: Diagnosis not present

## 2019-05-24 DIAGNOSIS — I340 Nonrheumatic mitral (valve) insufficiency: Secondary | ICD-10-CM | POA: Diagnosis not present

## 2019-05-24 DIAGNOSIS — I2489 Other forms of acute ischemic heart disease: Secondary | ICD-10-CM | POA: Clinically undetermined

## 2019-05-24 DIAGNOSIS — I2510 Atherosclerotic heart disease of native coronary artery without angina pectoris: Secondary | ICD-10-CM | POA: Diagnosis not present

## 2019-05-24 DIAGNOSIS — R079 Chest pain, unspecified: Secondary | ICD-10-CM | POA: Diagnosis not present

## 2019-05-24 DIAGNOSIS — Z880 Allergy status to penicillin: Secondary | ICD-10-CM | POA: Diagnosis not present

## 2019-05-24 DIAGNOSIS — Z20828 Contact with and (suspected) exposure to other viral communicable diseases: Secondary | ICD-10-CM | POA: Diagnosis present

## 2019-05-24 DIAGNOSIS — Z6841 Body Mass Index (BMI) 40.0 and over, adult: Secondary | ICD-10-CM

## 2019-05-24 DIAGNOSIS — I499 Cardiac arrhythmia, unspecified: Secondary | ICD-10-CM | POA: Diagnosis not present

## 2019-05-24 DIAGNOSIS — R000 Tachycardia, unspecified: Secondary | ICD-10-CM | POA: Diagnosis not present

## 2019-05-24 HISTORY — PX: LEFT HEART CATH AND CORONARY ANGIOGRAPHY: CATH118249

## 2019-05-24 HISTORY — DX: Other forms of acute ischemic heart disease: I24.8

## 2019-05-24 HISTORY — PX: CARDIOVERSION: SHX1299

## 2019-05-24 HISTORY — DX: Other forms of acute ischemic heart disease: I24.89

## 2019-05-24 HISTORY — DX: Other nonrheumatic aortic valve disorders: I35.8

## 2019-05-24 LAB — CBC WITH DIFFERENTIAL/PLATELET
Abs Immature Granulocytes: 0.04 10*3/uL (ref 0.00–0.07)
Basophils Absolute: 0.1 10*3/uL (ref 0.0–0.1)
Basophils Relative: 1 %
Eosinophils Absolute: 0.2 10*3/uL (ref 0.0–0.5)
Eosinophils Relative: 2 %
HCT: 37.4 % (ref 36.0–46.0)
Hemoglobin: 11.8 g/dL — ABNORMAL LOW (ref 12.0–15.0)
Immature Granulocytes: 0 %
Lymphocytes Relative: 28 %
Lymphs Abs: 3.7 10*3/uL (ref 0.7–4.0)
MCH: 27.1 pg (ref 26.0–34.0)
MCHC: 31.6 g/dL (ref 30.0–36.0)
MCV: 85.8 fL (ref 80.0–100.0)
Monocytes Absolute: 1.2 10*3/uL — ABNORMAL HIGH (ref 0.1–1.0)
Monocytes Relative: 9 %
Neutro Abs: 8 10*3/uL — ABNORMAL HIGH (ref 1.7–7.7)
Neutrophils Relative %: 60 %
Platelets: 347 10*3/uL (ref 150–400)
RBC: 4.36 MIL/uL (ref 3.87–5.11)
RDW: 14.6 % (ref 11.5–15.5)
WBC: 13.3 10*3/uL — ABNORMAL HIGH (ref 4.0–10.5)
nRBC: 0 % (ref 0.0–0.2)

## 2019-05-24 LAB — LIPID PANEL
Cholesterol: 158 mg/dL (ref 0–200)
HDL: 51 mg/dL (ref >40)
LDL Cholesterol: 72 mg/dL (ref 0–99)
Total CHOL/HDL Ratio: 3.1 RATIO
Triglycerides: 175 mg/dL — ABNORMAL HIGH (ref <150)
VLDL: 35 mg/dL (ref 0–40)

## 2019-05-24 LAB — COMPREHENSIVE METABOLIC PANEL
ALT: 28 U/L (ref 0–44)
AST: 29 U/L (ref 15–41)
Albumin: 3.8 g/dL (ref 3.5–5.0)
Alkaline Phosphatase: 69 U/L (ref 38–126)
Anion gap: 13 (ref 5–15)
BUN: 34 mg/dL — ABNORMAL HIGH (ref 8–23)
CO2: 19 mmol/L — ABNORMAL LOW (ref 22–32)
Calcium: 9.2 mg/dL (ref 8.9–10.3)
Chloride: 105 mmol/L (ref 98–111)
Creatinine, Ser: 1.14 mg/dL — ABNORMAL HIGH (ref 0.44–1.00)
GFR calc Af Amer: 57 mL/min — ABNORMAL LOW (ref >60)
GFR calc non Af Amer: 49 mL/min — ABNORMAL LOW (ref >60)
Glucose, Bld: 132 mg/dL — ABNORMAL HIGH (ref 70–99)
Potassium: 4 mmol/L (ref 3.5–5.1)
Sodium: 137 mmol/L (ref 135–145)
Total Bilirubin: 0.5 mg/dL (ref 0.3–1.2)
Total Protein: 6.8 g/dL (ref 6.5–8.1)

## 2019-05-24 LAB — CREATININE, SERUM
Creatinine, Ser: 0.98 mg/dL (ref 0.44–1.00)
GFR calc Af Amer: >60 mL/min (ref >60)
GFR calc non Af Amer: 59 mL/min — ABNORMAL LOW (ref >60)

## 2019-05-24 LAB — APTT: aPTT: 28 seconds (ref 24–36)

## 2019-05-24 LAB — CBC
HCT: 33.6 % — ABNORMAL LOW (ref 36.0–46.0)
Hemoglobin: 10.8 g/dL — ABNORMAL LOW (ref 12.0–15.0)
MCH: 27 pg (ref 26.0–34.0)
MCHC: 32.1 g/dL (ref 30.0–36.0)
MCV: 84 fL (ref 80.0–100.0)
Platelets: 254 10*3/uL (ref 150–400)
RBC: 4 MIL/uL (ref 3.87–5.11)
RDW: 14.4 % (ref 11.5–15.5)
WBC: 10.1 10*3/uL (ref 4.0–10.5)
nRBC: 0 % (ref 0.0–0.2)

## 2019-05-24 LAB — TROPONIN I (HIGH SENSITIVITY)
Troponin I (High Sensitivity): 521 ng/L (ref <18)
Troponin I (High Sensitivity): 942 ng/L

## 2019-05-24 LAB — SARS CORONAVIRUS 2 BY RT PCR (HOSPITAL ORDER, PERFORMED IN ~~LOC~~ HOSPITAL LAB): SARS Coronavirus 2: NEGATIVE

## 2019-05-24 LAB — ECHOCARDIOGRAM COMPLETE
Height: 64.5 in
Weight: 3888 oz

## 2019-05-24 LAB — PROTIME-INR
INR: 1 (ref 0.8–1.2)
Prothrombin Time: 12.7 seconds (ref 11.4–15.2)

## 2019-05-24 LAB — MRSA PCR SCREENING: MRSA by PCR: NEGATIVE

## 2019-05-24 SURGERY — LEFT HEART CATH AND CORONARY ANGIOGRAPHY
Anesthesia: LOCAL

## 2019-05-24 MED ORDER — HEPARIN SODIUM (PORCINE) 5000 UNIT/ML IJ SOLN: 5000.0000 [IU] | Freq: Three times a day (TID) | INTRAMUSCULAR | Status: DC

## 2019-05-24 MED ORDER — FENTANYL CITRATE (PF) 100 MCG/2ML IJ SOLN
INTRAMUSCULAR | Status: DC | PRN
  Administered 2019-05-24: 25 ug via INTRAVENOUS

## 2019-05-24 MED ORDER — ATORVASTATIN CALCIUM 80 MG PO TABS: 80.0000 mg | ORAL_TABLET | Freq: Every day | ORAL | Status: DC

## 2019-05-24 MED ORDER — VERAPAMIL HCL 2.5 MG/ML IV SOLN
INTRAVENOUS | Status: DC | PRN
  Administered 2019-05-24: 10 mL via INTRA_ARTERIAL

## 2019-05-24 MED ORDER — ENOXAPARIN SODIUM 40 MG/0.4ML ~~LOC~~ SOLN
40.0000 mg | SUBCUTANEOUS | Status: DC
  Administered 2019-05-25: 40 mg via SUBCUTANEOUS
  Filled 2019-05-24: qty 0.4

## 2019-05-24 MED ORDER — CHLORHEXIDINE GLUCONATE CLOTH 2 % EX PADS: 6.0000 | MEDICATED_PAD | Freq: Every day | CUTANEOUS | Status: DC

## 2019-05-24 MED ORDER — HYDRALAZINE HCL 20 MG/ML IJ SOLN: 10.0000 mg | INTRAMUSCULAR | Status: AC | PRN

## 2019-05-24 MED ORDER — MIDAZOLAM HCL 2 MG/2ML IJ SOLN
INTRAMUSCULAR | Status: DC | PRN
  Administered 2019-05-24: 1 mg via INTRAVENOUS

## 2019-05-24 MED ORDER — SODIUM CHLORIDE 0.9 % IV SOLN
INTRAVENOUS | Status: AC | PRN
  Administered 2019-05-24: 110 mL/kg/h via INTRAVENOUS

## 2019-05-24 MED ORDER — ONDANSETRON HCL 4 MG/2ML IJ SOLN: 4.0000 mg | Freq: Four times a day (QID) | INTRAMUSCULAR | Status: DC | PRN

## 2019-05-24 MED ORDER — SODIUM CHLORIDE 0.9 % IV SOLN: INTRAVENOUS | Status: AC

## 2019-05-24 MED ORDER — LABETALOL HCL 5 MG/ML IV SOLN: 10.0000 mg | INTRAVENOUS | Status: AC | PRN

## 2019-05-24 MED ORDER — NITROGLYCERIN 0.4 MG SL SUBL: 0.4000 mg | SUBLINGUAL_TABLET | SUBLINGUAL | Status: DC | PRN

## 2019-05-24 MED ORDER — SODIUM CHLORIDE 0.9% FLUSH
3.0000 mL | Freq: Two times a day (BID) | INTRAVENOUS | Status: DC
  Administered 2019-05-25: 3 mL via INTRAVENOUS

## 2019-05-24 MED ORDER — ASPIRIN 81 MG PO CHEW: 81.0000 mg | CHEWABLE_TABLET | Freq: Every day | ORAL | Status: DC

## 2019-05-24 MED ORDER — ASPIRIN EC 81 MG PO TBEC
81.0000 mg | DELAYED_RELEASE_TABLET | Freq: Every day | ORAL | Status: DC
  Administered 2019-05-25: 81 mg via ORAL
  Filled 2019-05-24: qty 1

## 2019-05-24 MED ORDER — SODIUM CHLORIDE 0.9% FLUSH: 3.0000 mL | INTRAVENOUS | Status: DC | PRN

## 2019-05-24 MED ORDER — METOPROLOL SUCCINATE ER 25 MG PO TB24
25.0000 mg | ORAL_TABLET | Freq: Every day | ORAL | Status: DC
  Administered 2019-05-24 – 2019-05-25 (×2): 25 mg via ORAL
  Filled 2019-05-24 (×2): qty 1

## 2019-05-24 MED ORDER — ADENOSINE 6 MG/2ML IV SOLN
INTRAVENOUS | Status: AC
  Administered 2019-05-24: 6 mg
  Filled 2019-05-24: qty 6

## 2019-05-24 MED ORDER — LIDOCAINE HCL (PF) 1 % IJ SOLN
INTRAMUSCULAR | Status: DC | PRN
  Administered 2019-05-24: 2 mL via SUBCUTANEOUS

## 2019-05-24 MED ORDER — SODIUM CHLORIDE 0.9 % IV SOLN
INTRAVENOUS | Status: DC
  Administered 2019-05-24: 11:00:00 via INTRAVENOUS

## 2019-05-24 MED ORDER — ACETAMINOPHEN 325 MG PO TABS: 650.0000 mg | ORAL_TABLET | ORAL | Status: DC | PRN

## 2019-05-24 MED ORDER — SODIUM CHLORIDE 0.9 % IV SOLN
INTRAVENOUS | Status: DC | PRN
  Administered 2019-05-24: 10 mL/h via INTRAVENOUS

## 2019-05-24 MED ORDER — IOHEXOL 350 MG/ML SOLN
INTRAVENOUS | Status: DC | PRN
  Administered 2019-05-24: 60 mL via INTRA_ARTERIAL

## 2019-05-24 MED ORDER — SODIUM CHLORIDE 0.9 % IV SOLN: 250.0000 mL | INTRAVENOUS | Status: DC | PRN

## 2019-05-24 MED ORDER — HEPARIN SODIUM (PORCINE) 5000 UNIT/ML IJ SOLN
4000.0000 [IU] | Freq: Once | INTRAMUSCULAR | Status: AC
  Administered 2019-05-24: 4000 [IU] via INTRAVENOUS
  Filled 2019-05-24: qty 0.8

## 2019-05-24 MED ORDER — SERTRALINE HCL 50 MG PO TABS
50.0000 mg | ORAL_TABLET | Freq: Every day | ORAL | Status: DC
  Administered 2019-05-25: 50 mg via ORAL
  Filled 2019-05-24: qty 1

## 2019-05-24 MED ORDER — HEPARIN (PORCINE) IN NACL 1000-0.9 UT/500ML-% IV SOLN
INTRAVENOUS | Status: DC | PRN
  Administered 2019-05-24 (×2): 500 mL

## 2019-05-24 MED ORDER — ATORVASTATIN CALCIUM 40 MG PO TABS
40.0000 mg | ORAL_TABLET | Freq: Every day | ORAL | Status: DC
  Administered 2019-05-24: 40 mg via ORAL
  Filled 2019-05-24: qty 1

## 2019-05-24 MED ORDER — ASPIRIN 81 MG PO CHEW
324.0000 mg | CHEWABLE_TABLET | Freq: Once | ORAL | Status: AC
  Administered 2019-05-24: 324 mg via ORAL
  Filled 2019-05-24: qty 4

## 2019-05-24 SURGICAL SUPPLY — 15 items
CATH 5FR JL3.5 JR4 ANG PIG MP (CATHETERS) ×1 IMPLANT
CATH VISTA GUIDE 6FR JR4 (CATHETERS) ×1 IMPLANT
DEVICE RAD COMP TR BAND LRG (VASCULAR PRODUCTS) ×1 IMPLANT
ELECT DEFIB PAD ADLT CADENCE (PAD) ×1 IMPLANT
GLIDESHEATH SLEND SS 6F .021 (SHEATH) ×1 IMPLANT
GUIDEWIRE INQWIRE 1.5J.035X260 (WIRE) IMPLANT
HOVERMATT SINGLE USE (MISCELLANEOUS) ×1 IMPLANT
INQWIRE 1.5J .035X260CM (WIRE) ×2
KIT HEART LEFT (KITS) ×2 IMPLANT
PACK CARDIAC CATHETERIZATION (CUSTOM PROCEDURE TRAY) ×2 IMPLANT
SHEATH PROBE COVER 6X72 (BAG) ×1 IMPLANT
SYR MEDRAD MARK 7 150ML (SYRINGE) ×2 IMPLANT
TRANSDUCER W/STOPCOCK (MISCELLANEOUS) ×2 IMPLANT
TUBING CIL FLEX 10 FLL-RA (TUBING) ×2 IMPLANT
WIRE HI TORQ VERSACORE-J 145CM (WIRE) ×1 IMPLANT

## 2019-05-24 NOTE — ED Triage Notes (Signed)
Pt reports was seen at her PCP today for dizziness and was advised to go to ED due to HR 185 in SVT. Pt reports some SOB. Reports symptoms started over the weekend.

## 2019-05-24 NOTE — Progress Notes (Signed)
  Echocardiogram 2D Echocardiogram has been performed.  Cynthia [name] 05/24/2019, 4:11 PM

## 2019-05-24 NOTE — Progress Notes (Signed)
Right radial TR band removed w/o complications. Gauze/transparent dressing applied. Pt instructed to continue NWB precautions with extremity.

## 2019-05-24 NOTE — ED Notes (Signed)
Called lab to confirm lab orders. Lab confirmed they received the orders. RN aware.

## 2019-05-24 NOTE — H&P (Addendum)
Cardiology Admission History and Physical:   Patient ID: Cynthia [name] MRN: [id number]; DOB: 09-29-1951   Admission date: 05/24/2019  Primary Care Provider: Darcus Austin, MD (Inactive) Primary Cardiologist: New  Chief Complaint:  Palpitations  Patient Profile:   Cynthia [name] is a 68 y.o. female with hx of HTN and anxiety disorders who brought by EMS as code STEMI.   History of Present Illness:   [name] was in [place] until 2 days ago when started to having intermittent palpitations lasting for 30-60 minutes. Also have chest pressure over weekend.  His morning she went to see PCP for worsening symptoms of palpitation with dizziness and chest pain. Noted in SVT at 185 bpm. Came to  [facility] ER for further evaluation. HR 180-190s with SVT. She was given 6mg  of IV adenosine with conversion with repeat EKG showed inferior minimal ST elevation with reciprocal depression. Code STEMI activated. Given ASA and heparin >> brought to ER further evaluation. Pending COVID and troponin. WBC 13.3.   Heart Pathway Score:     Past Medical History:  Diagnosis Date  . Anxiety   . Anxiety disorder   . Arthritis    right knee  . Depression   . GERD (gastroesophageal reflux disease)    TUMS  . Hypertension   . Lateral meniscus derangement, right     Past Surgical History:  Procedure Laterality Date  . CESAREAN SECTION     x3  . CHONDROPLASTY Right 02/21/2017   Procedure: CHONDROPLASTY;  Surgeon: Melrose Nakayama, MD;  Location: [place];  Service: Orthopedics;  Laterality: Right;  . EYE SURGERY Bilateral    cataract  . KNEE ARTHROSCOPY WITH LATERAL MENISECTOMY Right 02/21/2017   Procedure: KNEE ARTHROSCOPY WITH LATERAL MENISECTOMY;  Surgeon: Melrose Nakayama, MD;  Location: [place];  Service: Orthopedics;  Laterality: Right;  . KNEE ARTHROSCOPY WITH MEDIAL MENISECTOMY Right 02/21/2017   Procedure: KNEE ARTHROSCOPY WITH MEDIAL MENISECTOMY;  Surgeon: Melrose Nakayama,  MD;  Location: [place];  Service: Orthopedics;  Laterality: Right;     Medications Prior to Admission: Prior to Admission medications   Medication Sig Start Date Senita Corredor Date Taking? Authorizing Provider  BuPROPion HCl (WELLBUTRIN PO) Take 300 mg by mouth.     [provider]  clonazePAM (KLONOPIN) 0.5 MG tablet Take 0.5 mg by mouth 2 (two) times daily as needed for anxiety.    [provider]  HYDROcodone-acetaminophen (NORCO) 5-325 MG tablet Take 1-2 tablets by mouth every 4 (four) hours as needed for moderate pain. 02/21/17   Loni Dolly, PA-C  lisinopril-hydrochlorothiazide (PRINZIDE,ZESTORETIC) 20-25 MG per tablet Take 1 tablet by mouth daily.    [provider]  sertraline (ZOLOFT) 50 MG tablet Take 50 mg by mouth daily.    [provider]  traMADol (ULTRAM) 50 MG tablet Take by mouth every 6 (six) hours as needed.    [provider]     Allergies:    Allergies  Allergen Reactions  . Penicillins Rash    Social History:   Social History   Socioeconomic History  . Marital status: Married    Spouse name: Not on file  . Number of children: Not on file  . Years of education: Not on file  . Highest education level: Not on file  Occupational History  . Not on file  Social Needs  . Financial resource strain: Not on file  . Food insecurity    Worry: Not on file  Inability: Not on file  . Transportation needs    Medical: Not on file    Non-medical: Not on file  Tobacco Use  . Smoking status: Never Smoker  . Smokeless tobacco: Never Used  Substance and Sexual Activity  . Alcohol use: Yes    Comment: social  . Drug use: Not on file  . Sexual activity: Not on file  Lifestyle  . Physical activity    Days per week: Not on file    Minutes per session: Not on file  . Stress: Not on file  Relationships  . Social Herbalist on phone: Not on file    Gets together: Not on file    Attends religious service:  Not on file    Active member of club or organization: Not on file    Attends meetings of clubs or organizations: Not on file    Relationship status: Not on file  . Intimate partner violence    Fear of current or ex partner: Not on file    Emotionally abused: Not on file    Physically abused: Not on file    Forced sexual activity: Not on file  Other Topics Concern  . Not on file  Social History Narrative  . Not on file    Family History:   Mother has hx of arrhytmias  ROS:  Please see the history of present illness.  All other ROS reviewed and negative.     Physical Exam/Data:   Vitals:   05/24/19 1023 05/24/19 1035 05/24/19 1045 05/24/19 1124  BP:  123/76 (!) 134/116   Pulse:   97   Resp:  (!) 32 (!) 39   Temp:      TempSrc:      SpO2:   100% 100%  Weight: 110.2 kg     Height: 5' 4.5" (1.638 m)      No intake or output data in the 24 hours ending 05/24/19 1131 Last 3 Weights 05/24/2019 02/21/2017 02/17/2017  Weight (lbs) 243 lb 223 lb 6 oz 195 lb  Weight (kg) 110.224 kg 101.322 kg 88.451 kg     Body mass index is 41.07 kg/m.  General:  Obese female in no acute distress HEENT: normal Lymph: no adenopathy Neck: no JVD Endocrine:  No thryomegaly Vascular: No carotid bruits; FA pulses 2+ bilaterally without bruits  Cardiac:  normal S1, S2; RRR; no murmur  Lungs:  clear to auscultation bilaterally, no wheezing, rhonchi or rales  Abd: soft, nontender, no hepatomegaly  Ext: no edema Musculoskeletal:  No deformities, BUE and BLE strength normal and equal Skin: warm and dry  Neuro:  CNs 2-12 intact, no focal abnormalities noted Psych:  Normal affect    EKG:  The ECG that was done today was personally reviewed and demonstrates Sinus tachycardia at 100 bpm, Inferior STE with reciprocal depression   Relevant CV Studies:   Pending cath  Laboratory Data: Hematology Recent Labs  Lab 05/24/19 1041  WBC 13.3*  RBC 4.36  HGB 11.8*  HCT 37.4  MCV 85.8  MCH 27.1   MCHC 31.6  RDW 14.6  PLT 347   BNPNo results for input(s): BNP, PROBNP in the last 168 hours.  DDimer No results for input(s): DDIMER in the last 168 hours.   Radiology/Studies:  Dg Chest Port 1 View  Result Date: 05/24/2019 CLINICAL DATA:  STEMI. EXAM: PORTABLE CHEST 1 VIEW COMPARISON:  No prior. FINDINGS: Mild widening of the upper mediastinum, this may be related  to AP portable technique. PA lateral chest x-ray suggested for further evaluation. Mild cardiomegaly. Mild pulmonary venous congestion. Mild bibasilar atelectasis. No focal alveolar infiltrate. No pleural effusion or pneumothorax. No acute bony abnormality. IMPRESSION: 1. Mild widening of the upper mediastinum, this may be related to AP portable technique. PA and lateral chest x-ray suggested for further evaluation. 2.  Mild cardiomegaly.  Mild pulmonary venous congestion. 3.  Mild bibasilar atelectasis. Electronically Signed   By: Marcello Moores  Register   On: 05/24/2019 11:04    Assessment and Plan:   1. Interior  STEMI with reciprocal depression - Hs- troponin level 521. Improving ST changes on repeat EKG by EMS enroute.  - ACS vs chest pain 2nd to SVT  2. SVT - Converted to sinus after 6mg  of adenosine - K 4.0 - probably needs monitor at discharge - Consider BB   3. HTN - as above  Severity of Illness: The appropriate patient status for this patient is OBSERVATION. Observation status is judged to be reasonable and necessary in order to provide the required intensity of service to ensure the patient's safety. The patient's presenting symptoms, physical exam findings, and initial radiographic and laboratory data in the context of their medical condition is felt to place them at decreased risk for further clinical deterioration. Furthermore, it is anticipated that the patient will be medically stable for discharge from the hospital within 2 midnights of admission. The following factors support the patient status of observation.    " The patient's presenting symptoms include  Palpitation and chest pain . " The physical exam findings include None " The initial radiographic and laboratory data are STEMI     For questions or updates, please contact [facility] Please consult [email or web address] for contact info under        Signed, Leanor Kail, PA  05/24/2019 11:31 AM   I have independently seen and examined the patient and agree with the findings and plan, as documented in Oliver, PA's note, with the following additions/changes.  Pt is a 68 y/o woman with h/o HTN, anxiety, and morbid obesity, who reports intermittent palpitations over the last 2-3 days.  She had more persistent palpitations with associated chest pain this morning, which prompted her to go to the [facility] ED.  She was found to be in SVT, which was converted back to sinus rhythm with IV adenosine.  However, she continued to have chest pain after restoration of sinus rhythm, with EKG showing inferior ST segment elevation.  She was therefore transferred to [facility] for emergent cardiac catheterization.  This showed 60-70% proximal LAD stenosis but otherwise no significant disease.  By the Xaviera Flaten of the cath, chest discomfort had resolved.  Exam notable for RRR w/o murmurs.  Lungs are clear.  No LE edema.  Labs are notable for HS-TnI 521->942, Na 137, K 4.0, creatinine 1, LDL 72, WBC 10.1, HGB 10,8, plt 254.  EKG at 10:37 today (personally reviewed) shows sinus tachycardia with inferior ST elevation and lateral ST depressions.  I suspect chest pain and elevated troponin are most likely due to demand ischemia in the setting of SVT.  EKG changes seem out of proportion to angiographic findings, particularly since there is no significant disease involving the inferior wall.  As patient is currently CP free, I recommend continued medical therapy: will start metoprolol succinate 25 mg daily with uptitration as tolerated.  If she were to have recurrent SVT, EP  consultation will be pursued.  I favor medical management of 60-70%  LAD stenosis, including beta blocker and statin therapy.  If the patient has recurrent chest pain in the absence of SVT, function study to assess hemodynamic significance of this lesion would be helpful.  Follow-up echocardiogram to exclude other potential structural abnormalities.  Nelva Bush, MD [facility] Pager: [phone]

## 2019-05-24 NOTE — Brief Op Note (Signed)
BRIEF CARDIAC CATHETERIZATION NOTE  05/24/2019  11:57 AM  PATIENT:  Cynthia [name]  68 y.o. female  PRE-OPERATIVE DIAGNOSIS:  stemi  POST-OPERATIVE DIAGNOSIS:  SVT with demand ischemia  PROCEDURE:  Procedure(s): LEFT HEART CATH AND CORONARY ANGIOGRAPHY (N/A)  SURGEON:  Surgeon(s) and Role:    * Mabry Santarelli, Harrell Gave, MD - Primary  FINDINGS: 1. Single vessel CAD with 60-70% proximal LAD stenosis at the origin of large D1 branch. 2. Mildly reduced LVEF with mildly elevated LVEDP.  RECOMMENDATIONS: 1. I suspect chest pain and ST changes reflect demand ischemia.  Recommend medical therapy of non-critical LAD and reserve PCI for recurrent angina refractory to medical therapy. 2. Initiate beta blocker for treatment of SVT and cardiomyopathy.  If patient has recurrent SVT, consider EP consultation.  Nelva Bush, MD [facility] Pager: [phone]

## 2019-05-24 NOTE — ED Provider Notes (Signed)
[facility] DEPT Provider Note   CSN: [id number] Arrival date & time: 05/24/19  1005     History   Chief Complaint Chief Complaint  Patient presents with   Tachycardia    HPI Cynthia [name] is a 68 y.o. female.     HPI   89yF sent from PCP's office for SVT. Pt reports that since this past weekend end she has been having palpitations, shortness or breath, chest pressure and low energy. She has a history of anxiety and initially felt her symptoms may be from this. She and her husband were checking her pulse at home and noted that it was very high. They were sure if this was an error or not but symptoms persisted today so took her to PCP. HR noted to be 180-190 and EKG consistent with SVT. Advised to come to ED and she insisted in coming with husband. She denies any past cardiac history. No recent medication changes.   Past Medical History:  Diagnosis Date   Anxiety    Anxiety disorder    Arthritis    right knee   Depression    GERD (gastroesophageal reflux disease)    TUMS   Hypertension    Lateral meniscus derangement, right     There are no active problems to display for this patient.   Past Surgical History:  Procedure Laterality Date   CESAREAN SECTION     x3   CHONDROPLASTY Right 02/21/2017   Procedure: CHONDROPLASTY;  Surgeon: Melrose Nakayama, MD;  Location: [place];  Service: Orthopedics;  Laterality: Right;   EYE SURGERY Bilateral    cataract   KNEE ARTHROSCOPY WITH LATERAL MENISECTOMY Right 02/21/2017   Procedure: KNEE ARTHROSCOPY WITH LATERAL MENISECTOMY;  Surgeon: Melrose Nakayama, MD;  Location: [place];  Service: Orthopedics;  Laterality: Right;   KNEE ARTHROSCOPY WITH MEDIAL MENISECTOMY Right 02/21/2017   Procedure: KNEE ARTHROSCOPY WITH MEDIAL MENISECTOMY;  Surgeon: Melrose Nakayama, MD;  Location: [place];  Service: Orthopedics;  Laterality: Right;     OB  History   No obstetric history on file.      Home Medications    Prior to Admission medications   Medication Sig Start Date End Date Taking? Authorizing Provider  BuPROPion HCl (WELLBUTRIN PO) Take 300 mg by mouth.     [provider]  clonazePAM (KLONOPIN) 0.5 MG tablet Take 0.5 mg by mouth 2 (two) times daily as needed for anxiety.    [provider]  HYDROcodone-acetaminophen (NORCO) 5-325 MG tablet Take 1-2 tablets by mouth every 4 (four) hours as needed for moderate pain. 02/21/17   Loni Dolly, PA-C  lisinopril-hydrochlorothiazide (PRINZIDE,ZESTORETIC) 20-25 MG per tablet Take 1 tablet by mouth daily.    [provider]  sertraline (ZOLOFT) 50 MG tablet Take 50 mg by mouth daily.    [provider]  traMADol (ULTRAM) 50 MG tablet Take by mouth every 6 (six) hours as needed.    [provider]    Family History No family history on file.  Social History Social History   Tobacco Use   Smoking status: Never Smoker   Smokeless tobacco: Never Used  Substance Use Topics   Alcohol use: Yes    Comment: social   Drug use: Not on file     Allergies   Penicillins   Review of Systems Review of Systems  All systems reviewed and negative, other than as noted in HPI.  Physical Exam Updated Vital  Signs BP 90/69 (BP Location: Right Arm)    Pulse (!) 190    Temp 98.4 F (36.9 C) (Oral)    Resp 20    Ht 5' 4.5" (1.638 m)    Wt 110.2 kg    SpO2 97%    BMI 41.07 kg/m   Physical Exam Vitals signs and nursing note reviewed.  Constitutional:      Appearance: She is well-developed. She is obese.     Comments: Appears uncomfortable but not toxic.   HENT:     Head: Normocephalic and atraumatic.  Eyes:     General:        Right eye: No discharge.        Left eye: No discharge.     Conjunctiva/sclera: Conjunctivae normal.  Neck:     Musculoskeletal: Neck supple.  Cardiovascular:     Rate and Rhythm: Tachycardia present.      Heart sounds: Normal heart sounds. No murmur. No friction rub. No gallop.      Comments: Very rapid HR. Seems regular.  Pulmonary:     Effort: Pulmonary effort is normal. No respiratory distress.     Breath sounds: Normal breath sounds.  Abdominal:     General: There is no distension.     Palpations: Abdomen is soft.     Tenderness: There is no abdominal tenderness.  Musculoskeletal:        General: No tenderness.  Skin:    General: Skin is warm and dry.  Neurological:     Mental Status: She is alert.  Psychiatric:        Behavior: Behavior normal.        Thought Content: Thought content normal.      ED Treatments / Results  Labs (all labs ordered are listed, but only abnormal results are displayed) Labs Reviewed  CBC WITH DIFFERENTIAL/PLATELET - Abnormal; Notable for the following components:      Result Value   WBC 13.3 (*)    Hemoglobin 11.8 (*)    Neutro Abs 8.0 (*)    Monocytes Absolute 1.2 (*)    All other components within normal limits  SARS CORONAVIRUS 2 (HOSPITAL ORDER, [place] [facility])  PROTIME-INR  APTT  COMPREHENSIVE METABOLIC PANEL  LIPID PANEL  TROPONIN I (HIGH SENSITIVITY)    EKG EKG Interpretation  Date/Time:  Monday May 24 2019 10:37:10 EDT Ventricular Rate:  100 PR Interval:    QRS Duration: 94 QT Interval:  349 QTC Calculation: 451 R Axis:   78 Text Interpretation:  Sinus tachycardia Inferior infarct, acute (RCA) Probable RV involvement, suggest recording right precordial leads >>> Acute MI <<< Confirmed by Virgel Manifold [phone]) on 05/24/2019 10:50:52 AM   Radiology Dg Chest Port 1 View  Result Date: 05/24/2019 CLINICAL DATA:  STEMI. EXAM: PORTABLE CHEST 1 VIEW COMPARISON:  No prior. FINDINGS: Mild widening of the upper mediastinum, this may be related to AP portable technique. PA lateral chest x-ray suggested for further evaluation. Mild cardiomegaly. Mild pulmonary venous congestion. Mild bibasilar atelectasis. No  focal alveolar infiltrate. No pleural effusion or pneumothorax. No acute bony abnormality. IMPRESSION: 1. Mild widening of the upper mediastinum, this may be related to AP portable technique. PA and lateral chest x-ray suggested for further evaluation. 2.  Mild cardiomegaly.  Mild pulmonary venous congestion. 3.  Mild bibasilar atelectasis. Electronically Signed   By: Marcello Moores  Register   On: 05/24/2019 11:04    Procedures .Cardioversion  Date/Time: 05/24/2019 10:45 AM Performed by: Wilson Singer,  Annie Main, MD Authorized by: Virgel Manifold, MD   Consent:    Consent obtained:  Emergent situation and verbal   Consent given by:  Patient Pre-procedure details:    Cardioversion basis:  Emergent   Rhythm:  Supraventricular tachycardia Patient sedated: No Attempt one:    Cardioversion mode attempt one: 6 mg adenosine    Shock outcome:  Conversion to normal sinus rhythm Post-procedure details:    Patient status:  Awake   Patient tolerance of procedure: Patient tolerated adenosine fine but continued to complain of chest pressure after conversion to NSR. Post-conversion EKG showing inferior STE.     CRITICAL CARE Performed by: Virgel Manifold Total critical care time: 35 minutes Critical care time was exclusive of separately billable procedures and treating other patients. Critical care was necessary to treat or prevent imminent or life-threatening deterioration. Critical care was time spent personally by me on the following activities: development of treatment plan with patient and/or surrogate as well as nursing, discussions with consultants, evaluation of patient's response to treatment, examination of patient, obtaining history from patient or surrogate, ordering and performing treatments and interventions, ordering and review of laboratory studies, ordering and review of radiographic studies, pulse oximetry and re-evaluation of patient's condition.   Medications Ordered in ED Medications  0.9 %   sodium chloride infusion ( Intravenous New Bag/Given 05/24/19 1048)  adenosine (ADENOCARD) 6 MG/2ML injection (6 mg  Given 05/24/19 1035)  aspirin chewable tablet 324 mg (324 mg Oral Given 05/24/19 1049)  heparin injection 4,000 Units (4,000 Units Intravenous Given 05/24/19 1049)     Initial Impression / Assessment and Plan / ED Course  I have reviewed the triage vital signs and the nursing notes.  Pertinent labs & imaging results that were available during my care of the patient were reviewed by me and considered in my medical decision making (see chart for details).    68yF that initially presented in SVT. She was complaining of palpitations and some chest pressure but within realm of typical symptoms of someone in SVT. She converted to sinus rhythm with 6 mg of adenosine. Post conversion EKG with inferior STE and reciprocal depression. She continued to complain of chest pressure despite converting. Repeat EKG a couple minutes later still showing ST elevation in III and aVF and depression in I, aVL. Code STEMI called at that point. ASA/heparin. Discussed with cardiology. EMS to take to [facility].  Husband Ronalee Belts at bedside and updated on plan.   Final Clinical Impressions(s) / ED Diagnoses   Final diagnoses:  ST elevation myocardial infarction (STEMI), unspecified artery ([facility])  SVT (supraventricular tachycardia) [facility])    ED Discharge Orders    None       Virgel Manifold, MD 05/24/19 1117

## 2019-05-24 NOTE — Progress Notes (Signed)
CRITICAL VALUE ALERT  Critical Value:  Troponin high sensitivity 942  Date & Time Notied:  05/24/2019 1600  Provider Notified: Bhagat PA   Orders Received/Actions taken: value expected

## 2019-05-24 NOTE — ED Notes (Signed)
Cath lab called and report given.

## 2019-05-25 ENCOUNTER — Telehealth: Payer: Self-pay | Admitting: Cardiology

## 2019-05-25 ENCOUNTER — Encounter (HOSPITAL_COMMUNITY): Payer: Self-pay | Admitting: Internal Medicine

## 2019-05-25 DIAGNOSIS — I248 Other forms of acute ischemic heart disease: Secondary | ICD-10-CM | POA: Clinically undetermined

## 2019-05-25 DIAGNOSIS — I2510 Atherosclerotic heart disease of native coronary artery without angina pectoris: Secondary | ICD-10-CM | POA: Diagnosis present

## 2019-05-25 LAB — TROPONIN I (HIGH SENSITIVITY)
Troponin I (High Sensitivity): 513 ng/L (ref <18)
Troponin I (High Sensitivity): 498 ng/L (ref <18)

## 2019-05-25 LAB — CBC
HCT: 30.6 % — ABNORMAL LOW (ref 36.0–46.0)
Hemoglobin: 10 g/dL — ABNORMAL LOW (ref 12.0–15.0)
MCH: 27.2 pg (ref 26.0–34.0)
MCHC: 32.7 g/dL (ref 30.0–36.0)
MCV: 83.4 fL (ref 80.0–100.0)
Platelets: 257 10*3/uL (ref 150–400)
RBC: 3.67 MIL/uL — ABNORMAL LOW (ref 3.87–5.11)
RDW: 14.4 % (ref 11.5–15.5)
WBC: 10.5 10*3/uL (ref 4.0–10.5)
nRBC: 0 % (ref 0.0–0.2)

## 2019-05-25 LAB — BASIC METABOLIC PANEL
Anion gap: 10 (ref 5–15)
BUN: 21 mg/dL (ref 8–23)
CO2: 21 mmol/L — ABNORMAL LOW (ref 22–32)
Calcium: 8.6 mg/dL — ABNORMAL LOW (ref 8.9–10.3)
Chloride: 105 mmol/L (ref 98–111)
Creatinine, Ser: 0.85 mg/dL (ref 0.44–1.00)
GFR calc Af Amer: >60 mL/min (ref >60)
GFR calc non Af Amer: >60 mL/min (ref >60)
Glucose, Bld: 119 mg/dL — ABNORMAL HIGH (ref 70–99)
Potassium: 3.6 mmol/L (ref 3.5–5.1)
Sodium: 136 mmol/L (ref 135–145)

## 2019-05-25 LAB — HIV ANTIBODY (ROUTINE TESTING W REFLEX): HIV Screen 4th Generation wRfx: NONREACTIVE

## 2019-05-25 MED ORDER — NITROGLYCERIN 0.4 MG SL SUBL: 0.4000 mg | SUBLINGUAL_TABLET | SUBLINGUAL | 12 refills | Status: DC | PRN

## 2019-05-25 MED ORDER — ASPIRIN 81 MG PO TBEC: 81.0000 mg | DELAYED_RELEASE_TABLET | Freq: Every day | ORAL | 11 refills | Status: DC

## 2019-05-25 MED ORDER — ATORVASTATIN CALCIUM 40 MG PO TABS: 40.0000 mg | ORAL_TABLET | Freq: Every day | ORAL | 6 refills | Status: DC

## 2019-05-25 MED ORDER — METOPROLOL SUCCINATE ER 25 MG PO TB24: 25.0000 mg | ORAL_TABLET | Freq: Every day | ORAL | 6 refills | Status: DC

## 2019-05-25 MED FILL — ATORVASTATIN CALCIUM 40 MG: 40 | 30 days supply | Qty: 30 | Fill #0

## 2019-05-25 MED FILL — NITROGLYCERIN 0.4 MG TAB SL: 0.4 | 8 days supply | Qty: 25 | Fill #0

## 2019-05-25 MED FILL — METOPROLOL SUCCINATE ER 25: 25 | 30 days supply | Qty: 30 | Fill #0

## 2019-05-25 MED FILL — ASPIRIN LOW DOSE 81 MG TBEC: 81 | 30 days supply | Qty: 30 | Fill #0

## 2019-05-25 NOTE — Progress Notes (Signed)
Progress Note  Patient Name: Cynthia [name] Date of Encounter: 05/25/2019  Primary Cardiologist: No primary care provider on file.  New  Subjective   A little tired but otherwise feels fine.  No more chest pain.  No more tachycardia.  Inpatient Medications    Scheduled Meds: . aspirin EC  81 mg Oral Daily  . atorvastatin  40 mg Oral q1800  . Chlorhexidine Gluconate Cloth  6 each Topical Daily  . enoxaparin (LOVENOX) injection  40 mg Subcutaneous Q24H  . metoprolol succinate  25 mg Oral Daily  . sertraline  50 mg Oral Daily  . sodium chloride flush  3 mL Intravenous Q12H   Continuous Infusions: . sodium chloride Stopped (05/24/19 1900)  . sodium chloride     PRN Meds: sodium chloride, acetaminophen, nitroGLYCERIN, ondansetron (ZOFRAN) IV, sodium chloride flush   Vital Signs    Vitals:   05/25/19 0630 05/25/19 0700 05/25/19 0730 05/25/19 0800  BP: 117/73 93/64  (!) 111/58  Pulse: 78 76  75  Resp: 18 16  15   Temp:   98.4 F (36.9 C)   TempSrc:   Oral   SpO2: 93% 95%  96%  Weight:      Height:        Intake/Output Summary (Last 24 hours) at 05/25/2019 0911 Last data filed at 05/25/2019 0400 Gross per 24 hour  Intake 1406.28 ml  Output 300 ml  Net 1106.28 ml   Last 3 Weights 05/25/2019 05/24/2019 02/21/2017  Weight (lbs) 245 lb 2.4 oz 243 lb 223 lb 6 oz  Weight (kg) 111.2 kg 110.224 kg 101.322 kg      Telemetry    Sinus rhythm in 60s and 70s- Personally Reviewed  ECG    NO new EKG- Personally Reviewed -Post-cath EKG showed only nonspecific ST-T wave changes.  No further ST elevation.  Physical Exam   Physical Exam  Constitutional: She is oriented to person, place, and time. She appears well-developed and well-nourished. No distress.  Healthy-appearing  HENT:  Head: Normocephalic and atraumatic.  Neck: Normal range of motion. Neck supple. No JVD present.  Cardiovascular: Normal rate, regular rhythm, normal heart sounds and intact distal pulses. Exam  reveals no gallop and no friction rub.  No murmur heard. Pulmonary/Chest: Breath sounds normal. No respiratory distress. She has no wheezes.  Abdominal: Soft. Bowel sounds are normal. She exhibits no distension. There is no abdominal tenderness. There is no rebound.  Musculoskeletal: Normal range of motion.        General: No edema.  Neurological: She is alert and oriented to person, place, and time.  Psychiatric: She has a normal mood and affect. Her behavior is normal. Judgment and thought content normal.     Labs    High Sensitivity Troponin:   Recent Labs  Lab 05/24/19 1041 05/24/19 1243 05/25/19 0737  TROPONINIHS 521* 942* 513*      Chemistry Recent Labs  Lab 05/24/19 1041 05/24/19 1243 05/25/19 0243  NA 137  --  136  K 4.0  --  3.6  CL 105  --  105  CO2 19*  --  21*  GLUCOSE 132*  --  119*  BUN 34*  --  21  CREATININE 1.14* 0.98 0.85  CALCIUM 9.2  --  8.6*  PROT 6.8  --   --   ALBUMIN 3.8  --   --   AST 29  --   --   ALT 28  --   --  ALKPHOS 69  --   --   BILITOT 0.5  --   --   GFRNONAA 49* 59* >60  GFRAA 57* >60 >60  ANIONGAP 13  --  10     Hematology Recent Labs  Lab 05/24/19 1041 05/24/19 1243 05/25/19 0243  WBC 13.3* 10.1 10.5  RBC 4.36 4.00 3.67*  HGB 11.8* 10.8* 10.0*  HCT 37.4 33.6* 30.6*  MCV 85.8 84.0 83.4  MCH 27.1 27.0 27.2  MCHC 31.6 32.1 32.7  RDW 14.6 14.4 14.4  PLT 347 254 257    BNPNo results for input(s): BNP, PROBNP in the last 168 hours.   DDimer No results for input(s): DDIMER in the last 168 hours.   Radiology    Dg Chest Port 1 View  Result Date: 05/24/2019 CLINICAL DATA:  STEMI. EXAM: PORTABLE CHEST 1 VIEW COMPARISON:  No prior. FINDINGS: Mild widening of the upper mediastinum, this may be related to AP portable technique. PA lateral chest x-ray suggested for further evaluation. Mild cardiomegaly. Mild pulmonary venous congestion. Mild bibasilar atelectasis. No focal alveolar infiltrate. No pleural effusion or  pneumothorax. No acute bony abnormality. IMPRESSION: 1. Mild widening of the upper mediastinum, this may be related to AP portable technique. PA and lateral chest x-ray suggested for further evaluation. 2.  Mild cardiomegaly.  Mild pulmonary venous congestion. 3.  Mild bibasilar atelectasis. Electronically Signed   By: Marcello Moores  Register   On: 05/24/2019 11:04    Cardiac Studies    Cardiac Cath 05/24/2019: Moderate 60% proximal LAD just prior to bifurcation into the large diagonal branch.  Mild subtle anterior hypokinesis.  Mildly elevated LVEDP  2D echo May 24, 2019: Normal EF 55 to 60%.  No obvious WMA.  Moderate aortic sclerosis but no stenosis.  Patient Profile     68 y.o. female with history of anxiety and hypertension transferred via EMS from her PCPs office with subtle inferior ST elevation noted on EKG following prolonged episode of SVT.  When she went to her PCP with palpitations and chest pain she was noted to be in SVT at rate of 185 bpm.  Initially went to [facility] long ER, SVT broken with 6 mg of adenosine.  Follow-up EKG showed minimal inferior ST elevations with some reciprocal depressions.  Code STEMI was called and she was brought to the Cath Lab.  Cardiac catheter revealed moderate LAD disease, but no obvious lesion to explain inferior ST elevations besides potentially this LAD.  Plan was to treat medically.  Assessment & Plan    Active Problems:   Demand ischemia (HCC)   Coronary artery disease, 60% LAD prior to D1   SVT (supraventricular tachycardia) (HCC)  Presenting symptoms were concerning possibly for inferior STEMI, however with no angiographic evidence of an occluded vessel to explain the inferior ST elevation MI, this diagnosis is inaccurate.  Most likely the patient had demand ischemia from prolonged SVT with the existing LAD lesion --> this led to troponin elevation from DEMAND ISCHEMIA.Marland Kitchen  Based on the fact that this is a bifurcation lesion, and angiographically not  considered to be significant, the decision was made to treat medically starting a beta-blocker. --Relatively normal echocardiogram with no regional wall motion normalities would argue against a sizable infarct.  Were she to have any recurrent anginal symptoms the plan is ischemic evaluation of the LAD either invasive or noninvasive.  Plan:  Started on Toprol 25 mg daily. ->  Was converted from ACE inhibitor.  Continue to monitor blood pressure and heart  rate  Discussed vagal maneuvers.  If she does have recurrence of SVT, could consider EP consultation.  Added atorvastatin yesterday along with aspirin 81 mg.  Will ambulate in the hallway today.  Depending on symptoms, anticipate transfer to telemetry today and likely discharge either later on this afternoon or tomorrow     For questions or updates, please contact [place] Please consult [email or web address] for contact info under        Signed, Glenetta Hew, MD  05/25/2019, 9:11 AM

## 2019-05-25 NOTE — Progress Notes (Signed)
Patient discharged home with husband. All belongings sent with patient and spouse. Discharge instructions reviewed and sent with patient. IV taken out. VSS. No complaints.

## 2019-05-25 NOTE — Discharge Summary (Addendum)
Discharge Summary    Patient ID: Cynthia [name] MRN: [id number]; DOB: 1951/08/29  Admit date: 05/24/2019 Discharge date: 05/25/2019  Primary Care Provider: Darcus Austin, MD (Inactive)  Primary Cardiologist:New to Dr. Ellyn Hack   Discharge Diagnoses    Active Problems:   SVT (supraventricular tachycardia) [facility])   Demand ischemia ([place])   Coronary artery disease, 60% LAD prior to D1   HLD   HTN  Allergies Allergies  Allergen Reactions   Penicillins Rash    Pt does not remember, states she was real young    Diagnostic Studies/Procedures    Echocardiogram 05/24/2019 IMPRESSIONS  1. The left ventricle has normal systolic function, with an ejection fraction of 55-60%. The cavity size was normal. Left ventricular diastolic parameters were normal. No evidence of left ventricular regional wall motion abnormalities.  2. The aortic valve is tricuspid. Moderate sclerosis of the aortic valve.  3. The aorta is normal unless otherwise noted.   LEFT HEART CATH AND CORONARY ANGIOGRAPHY 05/24/2019  Conclusion  Conclusions: 1. Moderate to severe, non-critical single-vessel coronary artery disease with 60-70% proximal LAD stenosis at origin of large diagonal branch ([place] 1,0,0). 2. Low normal left ventricular systolic function with subtle anterior hypokinesis. 3. Mildly elevated left ventricular filling pressure.  Recommendations: 1. Medical therapy for coronary artery disease and supraventricular tachycardia.  I suspect chest pain and elevated HS-TnI are manifestations of demand ischemia.  Of note, inferior ST elevation had resolved by the time the patient arrived in the cath lab based on EMS EKG and chest pain had abated by the end of the case. 2. If patient has recurrent angina, consider functional study to assess hemodynamic significance of proximal LAD stenosis. 3. Obtain transthoracic echocardiogram to confirm left ventricular systolic function and to exclude other structural  abnormalities. 4. Consider electrophysiology consultation if recurrent SVT is noted despite medical therapy.   Diagnostic Dominance: Right    History of Present Illness     Cynthia [name] is a 68 y.o. female with hx of HTN and anxiety disorders who brought by EMS as code STEMI from [facility].   Cynthia [name] was in [place] until 2 days ago when started to having intermittent palpitations lasting for 30-60 minutes. Also have chest pressure over weekend.  She went to see PCP for worsening symptoms of palpitation with dizziness and chest pain. Noted in SVT at 185 bpm. Came to  [facility] ER for further evaluation. HR 180-190s with SVT. She was given 6mg  of IV adenosine with conversion with repeat EKG showing inferior minimal ST elevation with reciprocal depression. Code STEMI activated. Given ASA and heparin >> brought to [facility] for emergent cath.    Hospital Course     Consultants: None  Presenting symptoms were concerning possibly for inferior STEMI, however with no angiographic evidence of an occluded vessel to explain the inferior ST elevation MI, this diagnosis was inaccurate. Most likely the patient had demand ischemia from prolonged SVT with the existing LAD lesion (65%) --> this led to troponin elevation from DEMAND ISCHEMIA. Echo showed preserved LV function without WM abnormality. She was started on Toprol XL 25mg  daily (discontinued home lisinopril/HCTZ). Vagal maneuvers discussed. Consider EP evaluation if recurrent SVT. Advised to monitor BP and HR. She started on ASA and statin. Ambulated well without any recurrent symptoms.   The patient been seen by Dr. Ellyn Hack  today and deemed ready for discharge home. All follow-up appointments have been scheduled. Discharge medications are listed below.   Discharge Vitals Blood pressure 122/77, pulse  82, temperature 98.1 F (36.7 C), temperature source Oral, resp. rate 18, height 5' 4.5" (1.638 m), weight 111.2 kg, SpO2 95 %.  Filed Weights   05/24/19 1023  05/25/19 0600  Weight: 110.2 kg 111.2 kg    Labs & Radiologic Studies    CBC Recent Labs    05/24/19 1041 05/24/19 1243 05/25/19 0243  WBC 13.3* 10.1 10.5  NEUTROABS 8.0*  --   --   HGB 11.8* 10.8* 10.0*  HCT 37.4 33.6* 30.6*  MCV 85.8 84.0 83.4  PLT 347 254 [id number]   Basic Metabolic Panel Recent Labs    05/24/19 1041 05/24/19 1243 05/25/19 0243  NA 137  --  136  K 4.0  --  3.6  CL 105  --  105  CO2 19*  --  21*  GLUCOSE 132*  --  119*  BUN 34*  --  21  CREATININE 1.14* 0.98 0.85  CALCIUM 9.2  --  8.6*   Liver Function Tests Recent Labs    05/24/19 1041  AST 29  ALT 28  ALKPHOS 69  BILITOT 0.5  PROT 6.8  ALBUMIN 3.8   No results for input(s): LIPASE, AMYLASE in the last 72 hours. High Sensitivity Troponin:   Recent Labs  Lab 05/24/19 1041 05/24/19 1243 05/25/19 0737 05/25/19 0922  TROPONINIHS 521* 942* 513* 498*    Fasting Lipid Panel Recent Labs    05/24/19 1041  CHOL 158  HDL 51  LDLCALC 72  TRIG 175*  CHOLHDL 3.1   _____________  Dg Chest Port 1 View  Result Date: 05/24/2019 CLINICAL DATA:  STEMI. EXAM: PORTABLE CHEST 1 VIEW COMPARISON:  No prior. FINDINGS: Mild widening of the upper mediastinum, this may be related to AP portable technique. PA lateral chest x-ray suggested for further evaluation. Mild cardiomegaly. Mild pulmonary venous congestion. Mild bibasilar atelectasis. No focal alveolar infiltrate. No pleural effusion or pneumothorax. No acute bony abnormality. IMPRESSION: 1. Mild widening of the upper mediastinum, this may be related to AP portable technique. PA and lateral chest x-ray suggested for further evaluation. 2.  Mild cardiomegaly.  Mild pulmonary venous congestion. 3.  Mild bibasilar atelectasis. Electronically Signed   By: Marcello Moores  Register   On: 05/24/2019 11:04   Disposition   Pt is being discharged home today in good condition.  Follow-up Plans & Appointments     Discharge Instructions    Diet - low sodium heart  healthy   Complete by: As directed    Discharge instructions   Complete by: As directed    No driving for 48 hours. No lifting over 5 lbs for 1 week. No sexual activity for 1 week. You may return to work on 05/31/2019. Keep procedure site clean & dry. If you notice increased pain, swelling, bleeding or pus, call/return!  You may shower, but no soaking baths/hot tubs/pools for 1 week.   Increase activity slowly   Complete by: As directed       Discharge Medications   Allergies as of 05/25/2019      Reactions   Penicillins Rash   Pt does not remember, states she was real young      Medication List    STOP taking these medications   lisinopril-hydrochlorothiazide 20-25 MG tablet Commonly known as: ZESTORETIC     TAKE these medications   alendronate 70 MG tablet Commonly known as: FOSAMAX Take 70 mg by mouth once a week. Saturdays   aspirin 81 MG EC tablet Take 1 tablet (81 mg total)  by mouth daily. Start taking on: May 26, 2019   atorvastatin 40 MG tablet Commonly known as: LIPITOR Take 1 tablet (40 mg total) by mouth daily at 6 PM.   buPROPion 300 MG 24 hr tablet Commonly known as: WELLBUTRIN XL Take 300 mg by mouth daily.   CALCIUM 500 PO Take 1 tablet by mouth daily.   celecoxib 200 MG capsule Commonly known as: CELEBREX Take 200 mg by mouth daily.   clindamycin 300 MG capsule Commonly known as: CLEOCIN Take 300 mg by mouth daily.   desonide 0.05 % cream Commonly known as: DESOWEN Apply 1 application topically as needed for rash.   HYDROcodone-acetaminophen 5-325 MG tablet Commonly known as: Norco Take 1-2 tablets by mouth every 4 (four) hours as needed for moderate pain.   ibuprofen 200 MG tablet Commonly known as: ADVIL Take 400 mg by mouth every 6 (six) hours as needed for headache.   KlonoPIN 0.5 MG tablet Generic drug: clonazePAM Take 0.5 mg by mouth 2 (two) times daily as needed for anxiety.   metoprolol succinate 25 MG 24 hr  tablet Commonly known as: TOPROL-XL Take 1 tablet (25 mg total) by mouth daily. Start taking on: May 26, 2019   MULTIVITAMIN ADULT PO Take 1 tablet by mouth daily.   nitrofurantoin 100 MG capsule Commonly known as: MACRODANTIN Take 100 mg by mouth as needed.   nitroGLYCERIN 0.4 MG SL tablet Commonly known as: NITROSTAT Place 1 tablet (0.4 mg total) under the tongue every 5 (five) minutes x 3 doses as needed for chest pain.   nystatin-triamcinolone cream Commonly known as: MYCOLOG II Apply 1 application topically as needed for rash.   sertraline 50 MG tablet Commonly known as: ZOLOFT Take 50 mg by mouth daily.   VITAMIN D2 PO Take 50,000 Units by mouth daily.        Acute coronary syndrome (MI, NSTEMI, STEMI, etc) this admission?:  No.  The elevated Troponin was due to the acute medical illness or demand ischemia.    Outstanding Labs/Studies   Consider OP f/u labs 6-8 weeks given statin initiation this admission.  Duration of Discharge Encounter   Greater than 30 minutes including physician time.  Jarrett Soho, PA 05/25/2019, 12:51 PM   Patient seen and examined on the day of discharge.  She was doing well.  No further episodes of SVT.  No chest pain.  Ambulated in the hallway without difficulty.  Likely because of her anginal chest pain with demand ischemia in the setting of prolonged SVT.  She does have moderate LAD disease which should be evaluated in the outpatient as per my final progress note.  Plan:  Started on Toprol 25 mg daily. ->  Was converted from ACE inhibitor.  Continue to monitor blood pressure and heart rate ? Discussed vagal maneuvers.  If she does have recurrence of SVT, could consider EP consultation.  Added atorvastatin yesterday along with aspirin 81 mg.  Will ambulate in the hallway today.  Depending on symptoms, anticipate transfer to telemetry today and likely discharge this afternoon  Glenetta Hew, MD

## 2019-05-25 NOTE — Telephone Encounter (Signed)
Patient currently admitted

## 2019-05-25 NOTE — Telephone Encounter (Signed)
Patient has appt on 06/02/19 at 145pm with Fabian Sharp, requested by [facility].

## 2019-05-26 NOTE — Telephone Encounter (Signed)
Patient contacted regarding discharge from [facility] on 05/25/19.  Patient understands to follow up with provider Doreene Adas, PA on 06/02/19 at 1:45 pm at [facility]. Patient understands discharge instructions? yes Patient understands medications and regiment? yes Patient understands to bring all medications to this visit? yes

## 2019-05-27 ENCOUNTER — Encounter: Payer: Self-pay | Admitting: *Deleted

## 2019-05-27 ENCOUNTER — Other Ambulatory Visit: Payer: Self-pay | Admitting: *Deleted

## 2019-05-27 DIAGNOSIS — M7918 Myalgia, other site: Secondary | ICD-10-CM | POA: Insufficient documentation

## 2019-05-27 DIAGNOSIS — F419 Anxiety disorder, unspecified: Secondary | ICD-10-CM | POA: Insufficient documentation

## 2019-05-27 DIAGNOSIS — B373 Candidiasis of vulva and vagina: Secondary | ICD-10-CM | POA: Insufficient documentation

## 2019-05-27 DIAGNOSIS — N3020 Other chronic cystitis without hematuria: Secondary | ICD-10-CM | POA: Insufficient documentation

## 2019-05-27 DIAGNOSIS — N3281 Overactive bladder: Secondary | ICD-10-CM | POA: Insufficient documentation

## 2019-05-27 DIAGNOSIS — I10 Essential (primary) hypertension: Secondary | ICD-10-CM | POA: Insufficient documentation

## 2019-05-27 DIAGNOSIS — B3731 Acute candidiasis of vulva and vagina: Secondary | ICD-10-CM | POA: Insufficient documentation

## 2019-05-27 DIAGNOSIS — E669 Obesity, unspecified: Secondary | ICD-10-CM | POA: Insufficient documentation

## 2019-05-27 DIAGNOSIS — B372 Candidiasis of skin and nail: Secondary | ICD-10-CM | POA: Insufficient documentation

## 2019-05-27 MED FILL — ALPRAZolam 0.5 MG TABS: 0.5 | 90 days supply | Qty: 270 | Fill #0

## 2019-05-27 MED FILL — VIT D2 1.25 MG (50,000 UNIT: 1.25 MG | 84 days supply | Qty: 12 | Fill #0

## 2019-05-27 NOTE — Patient Outreach (Signed)
[facility]) Care Management  05/27/2019  Cynthia [name] [birth year] [id number]  Transition of care call/case closure   Referral received: 05/25/19 Initial outreach: 05/27/19 Insurance: [place] [employer]   Subjective: Initial successful telephone call to patient's home number in order to complete transition of care assessment; 2 HIPAA identifiers verified. Explained purpose of call and completed transition of care assessment.  Cynthia [name] states she is doing well, denies chest pain or pressure since discharge from the hospital, radial cardiac catherization site bruised but without swelling or discharge, tolerating diet, denies bowel or bladder issues.  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  She says she uses a [facility] outpatient pharmacy- [place] [facility]..  She denies educational needs related to staying safe during the COVID 19 pandemic.    Objective:  Cynthia [name] was hospitalized at [facility] from 8/24-8/25/2020 for heart palpitations and chest pressure. Her discharge diagnosis was cardiac demand ischemia related to SVT.  Comorbidities include: HTN, GERD, arthritis, candidiasis of skin, chronic cystitis, overactive bladder, anxiety, depression, morbid obesity She was discharged to home on 05/25/19 without the need for home health services or DME.   Assessment:  Patient voices good understanding of all discharge instructions after education provided. See transition of care flowsheet for assessment details.   Plan:  Reviewed hospital discharge diagnosis of demand ischemia related to SVT  and treatment plan using hospital discharge instructions, assessing medication adherence, reviewing problems requiring provider notification, and discussing the importance of follow up with cardiologist. No ongoing care management  needs identified so will close case to [place] Management services and route successful outreach letter with [place] Management pamphlet and 24 Hour Nurse Line Magnet to [place] Management clinical pool to be mailed to patient's home address. Included Emmi educational handout on heart healthy diet and SVT and educational handout on how to lower triglycerides.   Barrington Ellison RN,CCM,CDE [place] Management Coordinator Office Phone [phone] Office Fax [phone]

## 2019-05-31 ENCOUNTER — Telehealth: Payer: Self-pay

## 2019-05-31 NOTE — Telephone Encounter (Addendum)
   [facility] Pre-operative Risk Assessment    Request for surgical clearance:  1. What type of surgery is being performed? left knee arthroplasty   2. When is this surgery scheduled? pending   3. What type of clearance is required (medical clearance vs. Pharmacy clearance to hold med vs. Both)? medical  4. Are there any medications that need to be held prior to surgery and how long? n/a   5. Practice name and name of physician performing surgery? Dr Frederik Pear  [facility]   6. What is your office phone number? [phone]    7.   What is your office fax number? [phone]  ATTN: [name], Surgery Coordinator  8.   Anesthesia type (None, local, MAC, general)? spinal anesthesia   Cynthia [name], Cynthia [name] 05/31/2019, 6:11 PM  _________________________________________________________________   (provider comments below)

## 2019-06-01 NOTE — Progress Notes (Signed)
Cardiology Office Note:    Date:  06/02/2019   ID:  Cynthia [name], DOB [birth year], MRN [id number]  PCP:  Maurice Small, MD  Cardiologist:  Glenetta Hew, MD   Referring MD: No ref. provider found   Chief Complaint  Patient presents with  . Other    patient c/o SOB. meds reviewed verbally with patient.     History of Present Illness:    Cynthia [name] is a 68 y.o. female with a hx of hypertension and anxiety who presented to [facility] with palpitations, dizziness, and chest pain. He was noted to be in SVT at 185 bpm. 6 mg IV adenosiine converted him to NSR but EKG revealed inferior minimal ST elevation with reciprocal depression. CODE STEMI was initiated and he was transferred to [facility] for emergent heart catheterization. Angiography revealed 65% stenosis in proximal LAD, but no evidence of occluded vessel to explain inferior ST elevation. STEMI was thought to be an inaccurate diagnosis - likely demand ischemia in the setting of SVT. Medical therapy was recommended for the LAD lesion. Echo with normal EF and no WMA. Vagal maneuvers were reviewed and she was discharged on ASA and stating on 05/25/19.   She presents today for follow up and for medical clearance for knee replacement surgery. She had many questions about her hospital stay, SVT, and CAD. Most of the appt spent teaching and stressing lifestyle modifications for CAD. We also reviewed vagal maneuvers. She is unable to be as active as she would like given her bone-on-bone right knee. She needs both knees replaced. She denies angina, although she can't complete 4.0 METS currently. Her recent heart cath does reveal 65% stenosis in her LAD and no other disease. She does report DOE, but I think this is consistent with deconditioning and not an anginal equivalent. She also reports fatigue since her heart cath. We discussed taking her BB at night and starting OTC iron supplements for her anemia. I've asked her to touch base with her PCP for anemia.     Past Medical History:  Diagnosis Date  . Anxiety   . Anxiety disorder   . Arthritis    right knee  . Demand ischemia ([place]) 05/24/2019  . Depression   . GERD (gastroesophageal reflux disease)    TUMS  . Hypertension   . Lateral meniscus derangement, right     Past Surgical History:  Procedure Laterality Date  . CESAREAN SECTION     x3  . CHONDROPLASTY Right 02/21/2017   Procedure: CHONDROPLASTY;  Surgeon: Melrose Nakayama, MD;  Location: [place];  Service: Orthopedics;  Laterality: Right;  . EYE SURGERY Bilateral    cataract  . KNEE ARTHROSCOPY WITH LATERAL MENISECTOMY Right 02/21/2017   Procedure: KNEE ARTHROSCOPY WITH LATERAL MENISECTOMY;  Surgeon: Melrose Nakayama, MD;  Location: [place];  Service: Orthopedics;  Laterality: Right;  . KNEE ARTHROSCOPY WITH MEDIAL MENISECTOMY Right 02/21/2017   Procedure: KNEE ARTHROSCOPY WITH MEDIAL MENISECTOMY;  Surgeon: Melrose Nakayama, MD;  Location: [place];  Service: Orthopedics;  Laterality: Right;  . LEFT HEART CATH AND CORONARY ANGIOGRAPHY N/A 05/24/2019   Procedure: LEFT HEART CATH AND CORONARY ANGIOGRAPHY;  Surgeon: Nelva Bush, MD;  Location: [place] [facility];  Service: Cardiovascular;  Laterality: N/A;    Current Medications: Current Meds  Medication Sig  . alendronate (FOSAMAX) 70 MG tablet Take 70 mg by mouth once a week. Saturdays  . aspirin EC 81 MG EC tablet Take 1 tablet (81 mg  total) by mouth daily.  Marland Kitchen atorvastatin (LIPITOR) 40 MG tablet Take 1 tablet (40 mg total) by mouth daily at 6 PM.  . buPROPion (WELLBUTRIN XL) 300 MG 24 hr tablet Take 300 mg by mouth daily.  . Calcium Carbonate (CALCIUM 500 PO) Take 1 tablet by mouth daily.  . celecoxib (CELEBREX) 200 MG capsule Take 200 mg by mouth daily.  . Ergocalciferol (VITAMIN D2 PO) Take 50,000 Units by mouth daily.  . metoprolol succinate (TOPROL-XL) 25 MG 24 hr tablet Take 1 tablet (25 mg total) by mouth daily.  .  Multiple Vitamins-Minerals (MULTIVITAMIN ADULT PO) Take 1 tablet by mouth daily.  . nitrofurantoin (MACRODANTIN) 100 MG capsule Take 100 mg by mouth as needed.  . nitroGLYCERIN (NITROSTAT) 0.4 MG SL tablet Place 1 tablet (0.4 mg total) under the tongue every 5 (five) minutes x 3 doses as needed for chest pain.  Marland Kitchen sertraline (ZOLOFT) 50 MG tablet Take 50 mg by mouth daily.     Allergies:   Penicillins   Social History   Socioeconomic History  . Marital status: Married    Spouse name: Not on file  . Number of children: Not on file  . Years of education: Not on file  . Highest education level: Not on file  Occupational History  . Not on file  Social Needs  . Financial resource strain: Not on file  . Food insecurity    Worry: Not on file    Inability: Not on file  . Transportation needs    Medical: Not on file    Non-medical: Not on file  Tobacco Use  . Smoking status: Never Smoker  . Smokeless tobacco: Never Used  Substance and Sexual Activity  . Alcohol use: Yes    Comment: social  . Drug use: Not on file  . Sexual activity: Not on file  Lifestyle  . Physical activity    Days per week: Not on file    Minutes per session: Not on file  . Stress: Not on file  Relationships  . Social Herbalist on phone: Not on file    Gets together: Not on file    Attends religious service: Not on file    Active member of club or organization: Not on file    Attends meetings of clubs or organizations: Not on file    Relationship status: Not on file  Other Topics Concern  . Not on file  Social History Narrative  . Not on file     Family History: The patient's family history includes Hypertension in her father.  ROS:   Please see the history of present illness.     All other systems reviewed and are negative.  EKGs/Labs/Other Studies Reviewed:    The following studies were reviewed today:  Echocardiogram 05/24/2019 IMPRESSIONS 1. The left ventricle has normal  systolic function, with an ejection fraction of 55-60%. The cavity size was normal. Left ventricular diastolic parameters were normal. No evidence of left ventricular regional wall motion abnormalities. 2. The aortic valve is tricuspid. Moderate sclerosis of the aortic valve. 3. The aorta is normal unless otherwise noted.   LEFT HEART CATH AND CORONARY ANGIOGRAPHY 05/24/2019  Conclusion  Conclusions: 1. Moderate to severe, non-critical single-vessel coronary artery disease with 60-70% proximal LAD stenosis at origin of large diagonal branch ([place] 1,0,0). 2. Low normal left ventricular systolic function with subtle anterior hypokinesis. 3. Mildly elevated left ventricular filling pressure.  Recommendations: 1. Medical therapy for coronary artery  disease and supraventricular tachycardia. I suspect chest pain and elevated HS-TnI are manifestations of demand ischemia. Of note, inferior ST elevation had resolved by the time the patient arrived in the cath lab based on EMS EKG and chest pain had abated by the end of the case. 2. If patient has recurrent angina, consider functional study to assess hemodynamic significance of proximal LAD stenosis. 3. Obtain transthoracic echocardiogram to confirm left ventricular systolic function and to exclude other structural abnormalities. 4. Consider electrophysiology consultation if recurrent SVT is noted despite medical therapy.   Diagnostic Dominance: Right     EKG:  EKG is ordered today.  The ekg ordered today demonstrates sinus rhythm HR 61  Recent Labs: 05/24/2019: ALT 28 05/25/2019: BUN 21; Creatinine, Ser 0.85; Hemoglobin 10.0; Platelets 257; Potassium 3.6; Sodium 136  Recent Lipid Panel    Component Value Date/Time   CHOL 158 05/24/2019 1041   TRIG 175 (H) 05/24/2019 1041   HDL 51 05/24/2019 1041   CHOLHDL 3.1 05/24/2019 1041   VLDL 35 05/24/2019 1041   LDLCALC 72 05/24/2019 1041    Physical Exam:    VS:  BP 128/62 (BP  Location: Left Arm, Patient Position: Sitting, Cuff Size: Large)   Pulse 61   Temp (!) 96.3 F (35.7 C)   Ht 5' 4.5" (1.638 m)   Wt 239 lb (108.4 kg)   BMI 40.39 kg/m     Wt Readings from Last 3 Encounters:  06/02/19 239 lb (108.4 kg)  05/25/19 245 lb 2.4 oz (111.2 kg)  02/21/17 223 lb 6 oz (101.3 kg)     GEN: Well nourished, well developed in no acute distress HEENT: Normal NECK: No JVD; No carotid bruits LYMPHATICS: No lymphadenopathy CARDIAC: RRR, no murmurs, rubs, gallops RESPIRATORY:  Clear to auscultation without rales, wheezing or rhonchi  ABDOMEN: Soft, non-tender, non-distended MUSCULOSKELETAL:  No edema; No deformity  SKIN: Warm and dry NEUROLOGIC:  Alert and oriented x 3 PSYCHIATRIC:  Normal affect   ASSESSMENT:    1. Coronary artery disease, 60% LAD prior to D1   2. Preoperative clearance   3. Demand ischemia ([place])   4. SVT (supraventricular tachycardia) ([place])   5. Essential hypertension    PLAN:    In order of problems listed above:  SVT Sinus rhythm today. Continue BB. We reviewed vagal maneuvers.   CAD, demand ischemia in the setting of SVT ASA, statin, BB She does not report anginal symptoms. DOE seems consistent with deconditioning. She does feel fatigued. We discussed taking her BB at night and starting OTC iron supplements for her anemia.    Lipid panel, LDL goal < 70 05/24/2019: Cholesterol 158; HDL 51; LDL Cholesterol 72; Triglycerides 175; VLDL 35 Continue statin. We discussed continuation of statin for plaque stabilization and anti-inflammatory benefits.    Hypertension Well controlled. Past ACEI was replaced by BB during her hospitalization.    Anemia Hb 10, has been drifting down since 2014. She will start OTC iron supplements.    Preoperative clearance She can't complete more than 4.0 METS - mobility is limited by her knee pain. However, she just had heart cath which revealed nonobstructive disease, no intervention. "STEMI" was an  inaccurate diagnosis. She is not diabetic and has normal renal function. Surgery will be performed using epidural and not general anesthesia. According to the RCRI, she has 0.9% risk of a major cardiac event in the perioperative period. OK to hold ASA for 7 days prior to surgery if needed. I will route this  recommendation to Dr. Mayer Camel [facility].   Follow up with Dr. Ellyn Hack in 3-4 months.   Medication Adjustments/Labs and Tests Ordered: Current medicines are reviewed at length with the patient today.  Concerns regarding medicines are outlined above.  Orders Placed This Encounter  Procedures  . EKG 12-Lead   No orders of the defined types were placed in this encounter.   Signed, Ledora Bottcher, PA  06/02/2019 4:27 PM    [facility]

## 2019-06-01 NOTE — Telephone Encounter (Signed)
I think lets see how she is doing when she seen in follow-up. She is not on any anticoagulation.  If she is feeling fine medically.  She should be fine to have knee surgery done under spinal anesthesia.  Relatively low risk surgery. Her echo was pretty normal.  I do not think she would need any further cardiac evaluation.  Glenetta Hew, MD

## 2019-06-01 NOTE — Telephone Encounter (Signed)
The patient presented to the hospital on 05/24/2019 with possible STEMI, however there was no angiographic evidence of an occluded vessel on cath.  Diagnosis felt to be inaccurate.  She had had SVT and elevated troponin was felt to be related to demand ischemia due to prolonged SVT.  The patient did have a bifurcation lesion, not felt angiographically significant.  She was started on beta-blocker, statin and aspirin.  The patient has a hospital follow-up appointment scheduled for 06/02/2019 with Doreene Adas, PA.  Preop evaluation can be conducted at that time.  Patient would likely need about a month postevent just to make sure she has no recurrent arrhythmia or chest pain.  I will route this note to [facility] as well as Dr. Ellyn Hack for his input on timing of surgery.

## 2019-06-02 ENCOUNTER — Encounter: Payer: Self-pay | Admitting: Physician Assistant

## 2019-06-02 ENCOUNTER — Ambulatory Visit (INDEPENDENT_AMBULATORY_CARE_PROVIDER_SITE_OTHER): Payer: Medicare Other | Admitting: Physician Assistant

## 2019-06-02 ENCOUNTER — Other Ambulatory Visit: Payer: Self-pay

## 2019-06-02 VITALS — BP 128/62 | HR 61 | Temp 96.3°F | Ht 64.5 in | Wt 239.0 lb

## 2019-06-02 DIAGNOSIS — I10 Essential (primary) hypertension: Secondary | ICD-10-CM

## 2019-06-02 DIAGNOSIS — I471 Supraventricular tachycardia: Secondary | ICD-10-CM

## 2019-06-02 DIAGNOSIS — I248 Other forms of acute ischemic heart disease: Secondary | ICD-10-CM | POA: Diagnosis not present

## 2019-06-02 DIAGNOSIS — Z01818 Encounter for other preprocedural examination: Secondary | ICD-10-CM | POA: Diagnosis not present

## 2019-06-02 DIAGNOSIS — I2510 Atherosclerotic heart disease of native coronary artery without angina pectoris: Secondary | ICD-10-CM

## 2019-06-02 NOTE — Telephone Encounter (Signed)
Patient was seen in the office today by Doreene Adas, PA.  Clearance for procedure was outlined in the note.  Per Angie, the note was a faxed to Dr. Mayer Camel.

## 2019-06-02 NOTE — Patient Instructions (Addendum)
Medication Instructions:  Your physician recommends that you continue on your current medications as directed. Please refer to the Current Medication list given to you today.  If you need a refill on your cardiac medications before your next appointment, please call your pharmacy.    Follow-Up: At [facility], you and your health needs are our priority.  As part of our continuing mission to provide you with exceptional heart care, we have created designated Provider Care Teams.  These Care Teams include your primary Cardiologist (physician) and Advanced Practice Providers (APPs -  Physician Assistants and Nurse Practitioners) who all work together to provide you with the care you need, when you need it. You will need a follow up appointment in 3-4 months.  Please call our office 2 months in advance to schedule this appointment.  You may see Glenetta Hew, MD or one of the following Advanced Practice Providers on your designated Care Team:   Rosaria Ferries, PA-C . Jory Sims, DNP, ANP  Any Other Special Instructions Will Be Listed Below (If Applicable).   Heart Disease Prevention Heart disease is the leading cause of death in the world. Coronary artery disease is the most common cause of heart disease. This condition results when cholesterol and other substances (plaque) build up inside the walls of the blood vessels that supply your heart muscle (arteries). This buildup in arteries is called atherosclerosis. You can take actions to lower your risk of heart disease. How can heart disease affect me? Heart disease can cause many unpleasant symptoms and complications, such as:  Chest pain (angina).  Reduced or blocked blood flow to your heart. This can cause: ? Irregular heartbeats (arrhythmias). ? Heart attack. ? Heart failure. What can increase my risk? The following factors may make you more likely to develop this condition:  High blood pressure (hypertension).  High  cholesterol.  Smoking.  A diet high in saturated fats or trans fats.  Lack of physical activity.  Obesity.  Drinking too much alcohol.  Diabetes.  Having a family history of heart disease. What actions can I take to prevent heart disease? Nutrition   Eat a heart-healthy eating plan as told by your health care provider. Examples include the DASH (Dietary Approaches to Stop Hypertension) eating plan or the Mediterranean diet.  Generally, it is recommended that you: ? Eat less salt (sodium). Ask your health care provider how much sodium is safe for you. Most people should have less than 2,300 mg each day. ? Limit unhealthy fats, such as saturated and trans fats, in your diet. You can do this by eating low-fat dairy products, eating less red meat, and avoiding processed foods. ? Eat healthy fats (omega-3 fatty acids). These are found in fish, such as mackerel or salmon. ? Eat more fruits and vegetables. You should try to fill one-half of your plate with fruits and vegetables at each meal. ? Eat more whole grains. ? Avoid foods and drinks that have added sugars. Lifestyle   Get regular exercise. This is one of the most important things you can do for your health. Generally, it is recommended that you: ? Exercise for at least 30 minutes on most days of the week (150 minutes each week). The exercise should increase your heart rate and make you sweat (aerobic exercise). ? Add strength exercises on at least 2 days each week.  Do not use any products that contain nicotine or tobacco, such as cigarettes and e-cigarettes. These can damage your heart and blood vessels.  If you need help quitting, ask your health care provider. Alcohol use  Do not drink alcohol if: ? Your health care provider tells you not to drink. ? You are pregnant, may be pregnant, or are planning to become pregnant.  If you drink alcohol, limit how much you have: ? 0-1 drink a day for women. ? 0-2 drinks a day for  men.  Be aware of how much alcohol is in your drink. In the [place] one drink equals one typical bottle of beer (12 oz), one-half glass of wine (5 oz), or one shot of hard liquor (1 oz). Medicines  Take over-the-counter and prescription medicines only as told by your health care provider.  Ask your health care provider whether you should take an aspirin every day. Taking aspirin may help reduce your risk of heart disease and stroke.  Depending on your risk factors, your health care provider may prescribe medicines to lower your risk of heart disease or to control related conditions. You may take medicine to: ? Lower cholesterol. ? Control blood pressure. ? Control diabetes. General information  Keep your blood pressure under control, as recommended by your health care provider. For most healthy people, the upper number of your blood pressure (systolic) should be no higher than 120, and the lower number (diastolic) no higher than 80. Treatment may be needed if your blood pressure is higher than 130/80.  Have your blood pressure checked at least every two years. Your health care provider may check your blood pressure more often if you have high blood pressure.  After age 15, have your cholesterol checked every 4-6 years. If you have risk factors for heart disease, you may need to have it checked more frequently. Treatment may be needed if your cholesterol is high.  Have your body mass index (BMI) checked every year. Your health care provider can calculate your BMI from your height and weight.  Work with your health care provider to lose weight, if needed, or to maintain a healthy weight. Where to find more information:  Centers for Disease Control and Prevention: TextNotebook.fi  American Heart Association: www.heart.org ? Take a free online heart disease risk quiz to better understand your personal risk factors. Summary  Heart disease is the leading cause of death in the world.   Heart disease can cause chest pain, abnormal heart rhythms, heart attack, and heart failure.  High blood pressure, high cholesterol, and smoking are the main risk factors for heart disease, although other factors also contribute.  You can take actions to lower your chances of developing heart disease. Work with your health care provider to reduce your risk by following a heart-healthy diet, being physically active, and controlling your weight, blood pressure, and cholesterol level. This information is not intended to replace advice given to you by your health care provider. Make sure you discuss any questions you have with your health care provider. Document Released: 04/30/2004 Document Revised: 10/01/2017 Document Reviewed: 10/01/2017 Elsevier Patient Education  2020 [employer].    _______________________________________________________________________________________________    [id number] Diet A Mediterranean diet refers to food and lifestyle choices that are based on the traditions of countries located on the [employer]. This way of eating has been shown to help prevent certain conditions and improve outcomes for people who have chronic diseases, like kidney disease and heart disease. What are tips for following this plan? Lifestyle  Cook and eat meals together with your family, when possible.  Drink enough fluid to keep your urine  clear or pale yellow.  Be physically active every day. This includes: ? Aerobic exercise like running or swimming. ? Leisure activities like gardening, walking, or housework.  Get 7-8 hours of sleep each night.  If recommended by your health care provider, drink red wine in moderation. This means 1 glass a day for nonpregnant women and 2 glasses a day for men. A glass of wine equals 5 oz (150 mL). Reading food labels   Check the serving size of packaged foods. For foods such as rice and pasta, the serving size refers to the amount of  cooked product, not dry.  Check the total fat in packaged foods. Avoid foods that have saturated fat or trans fats.  Check the ingredients list for added sugars, such as corn syrup. Shopping  At the grocery store, buy most of your food from the areas near the walls of the store. This includes: ? Fresh fruits and vegetables (produce). ? Grains, beans, nuts, and seeds. Some of these may be available in unpackaged forms or large amounts (in bulk). ? Fresh seafood. ? Poultry and eggs. ? Low-fat dairy products.  Buy whole ingredients instead of prepackaged foods.  Buy fresh fruits and vegetables in-season from local farmers markets.  Buy frozen fruits and vegetables in resealable bags.  If you do not have access to quality fresh seafood, buy precooked frozen shrimp or canned fish, such as tuna, salmon, or sardines.  Buy small amounts of raw or cooked vegetables, salads, or olives from the deli or salad bar at your store.  Stock your pantry so you always have certain foods on hand, such as olive oil, canned tuna, canned tomatoes, rice, pasta, and beans. Cooking  Cook foods with extra-virgin olive oil instead of using butter or other vegetable oils.  Have meat as a side dish, and have vegetables or grains as your main dish. This means having meat in small portions or adding small amounts of meat to foods like pasta or stew.  Use beans or vegetables instead of meat in common dishes like chili or lasagna.  Experiment with different cooking methods. Try roasting or broiling vegetables instead of steaming or sauteing them.  Add frozen vegetables to soups, stews, pasta, or rice.  Add nuts or seeds for added healthy fat at each meal. You can add these to yogurt, salads, or vegetable dishes.  Marinate fish or vegetables using olive oil, lemon juice, garlic, and fresh herbs. Meal planning   Plan to eat 1 vegetarian meal one day each week. Try to work up to 2 vegetarian meals, if  possible.  Eat seafood 2 or more times a week.  Have healthy snacks readily available, such as: ? Vegetable sticks with hummus. ? [place] yogurt. ? Fruit and nut trail mix.  Eat balanced meals throughout the week. This includes: ? Fruit: 2-3 servings a day ? Vegetables: 4-5 servings a day ? Low-fat dairy: 2 servings a day ? Fish, poultry, or lean meat: 1 serving a day ? Beans and legumes: 2 or more servings a week ? Nuts and seeds: 1-2 servings a day ? Whole grains: 6-8 servings a day ? Extra-virgin olive oil: 3-4 servings a day  Limit red meat and sweets to only a few servings a month What are my food choices?  Mediterranean diet ? Recommended  Grains: Whole-grain pasta. Brown rice. Bulgar wheat. Polenta. Couscous. Whole-wheat bread. [place].  Vegetables: Artichokes. Beets. Broccoli. Cabbage. Carrots. Eggplant. Green beans. Chard. Kale. Spinach. Onions. Leeks. Peas. Squash.  Tomatoes. Peppers. Radishes.  Fruits: Apples. Apricots. Avocado. Berries. Bananas. Cherries. Dates. Figs. Grapes. Lemons. Melon. Oranges. Peaches. Plums. Pomegranate.  Meats and other protein foods: Beans. Almonds. Sunflower seeds. Pine nuts. Peanuts. [place]. Salmon. Scallops. Shrimp. Man. Tilapia. Clams. Oysters. Eggs.  Dairy: Low-fat milk. Cheese. Greek yogurt.  Beverages: Water. Red wine. Herbal tea.  Fats and oils: Extra virgin olive oil. Avocado oil. Grape seed oil.  Sweets and desserts: [place] yogurt with honey. Baked apples. Poached pears. Trail mix.  Seasoning and other foods: Basil. Cilantro. Coriander. Cumin. Mint. Parsley. Sage. Rosemary. Tarragon. Garlic. Oregano. Thyme. Pepper. Balsalmic vinegar. Tahini. Hummus. Tomato sauce. Olives. Mushrooms. ? Limit these  Grains: Prepackaged pasta or rice dishes. Prepackaged cereal with added sugar.  Vegetables: Deep fried potatoes (french fries).  Fruits: Fruit canned in syrup.  Meats and other protein foods: Beef. Pork. Lamb. Poultry with  skin. Hot dogs. Berniece Salines.  Dairy: Ice cream. Sour cream. Whole milk.  Beverages: Juice. Sugar-sweetened soft drinks. Beer. Liquor and spirits.  Fats and oils: Butter. Canola oil. Vegetable oil. Beef fat (tallow). Lard.  Sweets and desserts: Cookies. Cakes. Pies. Candy.  Seasoning and other foods: Mayonnaise. Premade sauces and marinades. The items listed may not be a complete list. Talk with your dietitian about what dietary choices are right for you. Summary  The Mediterranean diet includes both food and lifestyle choices.  Eat a variety of fresh fruits and vegetables, beans, nuts, seeds, and whole grains.  Limit the amount of red meat and sweets that you eat.  Talk with your health care provider about whether it is safe for you to drink red wine in moderation. This means 1 glass a day for nonpregnant women and 2 glasses a day for men. A glass of wine equals 5 oz (150 mL). This information is not intended to replace advice given to you by your health care provider. Make sure you discuss any questions you have with your health care provider. Document Released: 05/09/2016 Document Revised: 05/16/2016 Document Reviewed: 05/09/2016 Elsevier Patient Education  2020 [employer].   ___________________________________________________________________________________________    [employer] Eating Plan DASH stands for "Dietary Approaches to Stop Hypertension." The DASH eating plan is a healthy eating plan that has been shown to reduce high blood pressure (hypertension). It may also reduce your risk for type 2 diabetes, heart disease, and stroke. The DASH eating plan may also help with weight loss. What are tips for following this plan?  General guidelines  Avoid eating more than 2,300 mg (milligrams) of salt (sodium) a day. If you have hypertension, you may need to reduce your sodium intake to 1,500 mg a day.  Limit alcohol intake to no more than 1 drink a day for nonpregnant women and 2 drinks a  day for men. One drink equals 12 oz of beer, 5 oz of wine, or 1 oz of hard liquor.  Work with your health care provider to maintain a healthy body weight or to lose weight. Ask what an ideal weight is for you.  Get at least 30 minutes of exercise that causes your heart to beat faster (aerobic exercise) most days of the week. Activities may include walking, swimming, or biking.  Work with your health care provider or diet and nutrition specialist (dietitian) to adjust your eating plan to your individual calorie needs. Reading food labels   Check food labels for the amount of sodium per serving. Choose foods with less than 5 percent of the Daily Value of sodium. Generally, foods with less  than 300 mg of sodium per serving fit into this eating plan.  To find whole grains, look for the word "whole" as the first word in the ingredient list. Shopping  Buy products labeled as "low-sodium" or "no salt added."  Buy fresh foods. Avoid canned foods and premade or frozen meals. Cooking  Avoid adding salt when cooking. Use salt-free seasonings or herbs instead of table salt or sea salt. Check with your health care provider or pharmacist before using salt substitutes.  Do not fry foods. Cook foods using healthy methods such as baking, boiling, grilling, and broiling instead.  Cook with heart-healthy oils, such as olive, canola, soybean, or sunflower oil. Meal planning  Eat a balanced diet that includes: ? 5 or more servings of fruits and vegetables each day. At each meal, try to fill half of your plate with fruits and vegetables. ? Up to 6-8 servings of whole grains each day. ? Less than 6 oz of lean meat, poultry, or fish each day. A 3-oz serving of meat is about the same size as a deck of cards. One egg equals 1 oz. ? 2 servings of low-fat dairy each day. ? A serving of nuts, seeds, or beans 5 times each week. ? Heart-healthy fats. Healthy fats called Omega-3 fatty acids are found in foods such  as flaxseeds and coldwater fish, like sardines, salmon, and mackerel.  Limit how much you eat of the following: ? Canned or prepackaged foods. ? Food that is high in trans fat, such as fried foods. ? Food that is high in saturated fat, such as fatty meat. ? Sweets, desserts, sugary drinks, and other foods with added sugar. ? Full-fat dairy products.  Do not salt foods before eating.  Try to eat at least 2 vegetarian meals each week.  Eat more home-cooked food and less restaurant, buffet, and fast food.  When eating at a restaurant, ask that your food be prepared with less salt or no salt, if possible. What foods are recommended? The items listed may not be a complete list. Talk with your dietitian about what dietary choices are best for you. Grains Whole-grain or whole-wheat bread. Whole-grain or whole-wheat pasta. Brown rice. [place]. Bulgur. Whole-grain and low-sodium cereals. Pita bread. Low-fat, low-sodium crackers. Whole-wheat flour tortillas. Vegetables Fresh or frozen vegetables (raw, steamed, roasted, or grilled). Low-sodium or reduced-sodium tomato and vegetable juice. Low-sodium or reduced-sodium tomato sauce and tomato paste. Low-sodium or reduced-sodium canned vegetables. Fruits All fresh, dried, or frozen fruit. Canned fruit in natural juice (without added sugar). Meat and other protein foods Skinless chicken or [place]. Ground chicken or [place]. Pork with fat trimmed off. Fish and seafood. Egg whites. Dried beans, peas, or lentils. Unsalted nuts, nut butters, and seeds. Unsalted canned beans. Lean cuts of beef with fat trimmed off. Low-sodium, lean deli meat. Dairy Low-fat (1%) or fat-free (skim) milk. Fat-free, low-fat, or reduced-fat cheeses. Nonfat, low-sodium ricotta or cottage cheese. Low-fat or nonfat yogurt. Low-fat, low-sodium cheese. Fats and oils Soft margarine without trans fats. Vegetable oil. Low-fat, reduced-fat, or light mayonnaise and salad dressings  (reduced-sodium). Canola, safflower, olive, soybean, and sunflower oils. Avocado. Seasoning and other foods Herbs. Spices. Seasoning mixes without salt. Unsalted popcorn and pretzels. Fat-free sweets. What foods are not recommended? The items listed may not be a complete list. Talk with your dietitian about what dietary choices are best for you. Grains Baked goods made with fat, such as croissants, muffins, or some breads. Dry pasta or rice meal packs. Vegetables  Creamed or fried vegetables. Vegetables in a cheese sauce. Regular canned vegetables (not low-sodium or reduced-sodium). Regular canned tomato sauce and paste (not low-sodium or reduced-sodium). Regular tomato and vegetable juice (not low-sodium or reduced-sodium). Angie Fava. Olives. Fruits Canned fruit in a light or heavy syrup. Fried fruit. Fruit in cream or butter sauce. Meat and other protein foods Fatty cuts of meat. Ribs. Fried meat. Berniece Salines. Sausage. Bologna and other processed lunch meats. Salami. Fatback. Hotdogs. Bratwurst. Salted nuts and seeds. Canned beans with added salt. Canned or smoked fish. Whole eggs or egg yolks. Chicken or [place] with skin. Dairy Whole or 2% milk, cream, and half-and-half. Whole or full-fat cream cheese. Whole-fat or sweetened yogurt. Full-fat cheese. Nondairy creamers. Whipped toppings. Processed cheese and cheese spreads. Fats and oils Butter. Stick margarine. Lard. Shortening. Ghee. Bacon fat. Tropical oils, such as coconut, palm kernel, or palm oil. Seasoning and other foods Salted popcorn and pretzels. Onion salt, garlic salt, seasoned salt, table salt, and sea salt. Worcestershire sauce. Tartar sauce. Barbecue sauce. Teriyaki sauce. Soy sauce, including reduced-sodium. Steak sauce. Canned and packaged gravies. Fish sauce. Oyster sauce. Cocktail sauce. Horseradish that you find on the shelf. Ketchup. Mustard. Meat flavorings and tenderizers. Bouillon cubes. Hot sauce and Tabasco sauce. Premade or  packaged marinades. Premade or packaged taco seasonings. Relishes. Regular salad dressings. Where to find more information:  National Heart, Lung, and [place]: https://wilson-eaton.com/  American Heart Association: www.heart.org Summary  The DASH eating plan is a healthy eating plan that has been shown to reduce high blood pressure (hypertension). It may also reduce your risk for type 2 diabetes, heart disease, and stroke.  With the DASH eating plan, you should limit salt (sodium) intake to 2,300 mg a day. If you have hypertension, you may need to reduce your sodium intake to 1,500 mg a day.  When on the DASH eating plan, aim to eat more fresh fruits and vegetables, whole grains, lean proteins, low-fat dairy, and heart-healthy fats.  Work with your health care provider or diet and nutrition specialist (dietitian) to adjust your eating plan to your individual calorie needs. This information is not intended to replace advice given to you by your health care provider. Make sure you discuss any questions you have with your health care provider. Document Released: 09/05/2011 Document Revised: 08/29/2017 Document Reviewed: 09/09/2016 Elsevier Patient Education  2020 [employer].   ____________________________________________________________________________________________________   Anemia  Anemia is a condition in which you do not have enough red blood cells or hemoglobin. Hemoglobin is a substance in red blood cells that carries oxygen. When you do not have enough red blood cells or hemoglobin (are anemic), your body cannot get enough oxygen and your organs may not work properly. As a result, you may feel very tired or have other problems. What are the causes? Common causes of anemia include:  Excessive bleeding. Anemia can be caused by excessive bleeding inside or outside the body, including bleeding from the intestine or from periods in women.  Poor nutrition.  Long-lasting (chronic)  kidney, thyroid, and liver disease.  Bone marrow disorders.  Cancer and treatments for cancer.  HIV (human immunodeficiency virus) and AIDS (acquired immunodeficiency syndrome).  Treatments for HIV and AIDS.  Spleen problems.  Blood disorders.  Infections, medicines, and autoimmune disorders that destroy red blood cells. What are the signs or symptoms? Symptoms of this condition include:  Minor weakness.  Dizziness.  Headache.  Feeling heartbeats that are irregular or faster than normal (palpitations).  Shortness of breath,  especially with exercise.  Paleness.  Cold sensitivity.  Indigestion.  Nausea.  Difficulty sleeping.  Difficulty concentrating. Symptoms may occur suddenly or develop slowly. If your anemia is mild, you may not have symptoms. How is this diagnosed? This condition is diagnosed based on:  Blood tests.  Your medical history.  A physical exam.  Bone marrow biopsy. Your health care provider may also check your stool (feces) for blood and may do additional testing to look for the cause of your bleeding. You may also have other tests, including:  Imaging tests, such as a CT scan or MRI.  Endoscopy.  Colonoscopy. How is this treated? Treatment for this condition depends on the cause. If you continue to lose a lot of blood, you may need to be treated at a hospital. Treatment may include:  Taking supplements of iron, vitamin P10, or folic acid.  Taking a hormone medicine (erythropoietin) that can help to stimulate red blood cell growth.  Having a blood transfusion. This may be needed if you lose a lot of blood.  Making changes to your diet.  Having surgery to remove your spleen. Follow these instructions at home:  Take over-the-counter and prescription medicines only as told by your health care provider.  Take supplements only as told by your health care provider.  Follow any diet instructions that you were given.  Keep all  follow-up visits as told by your health care provider. This is important. Contact a health care provider if:  You develop new bleeding anywhere in the body. Get help right away if:  You are very weak.  You are short of breath.  You have pain in your abdomen or chest.  You are dizzy or feel faint.  You have trouble concentrating.  You have bloody or black, tarry stools.  You vomit repeatedly or you vomit up blood. Summary  Anemia is a condition in which you do not have enough red blood cells or enough of a substance in your red blood cells that carries oxygen (hemoglobin).  Symptoms may occur suddenly or develop slowly.  If your anemia is mild, you may not have symptoms.  This condition is diagnosed with blood tests as well as a medical history and physical exam. Other tests may be needed.  Treatment for this condition depends on the cause of the anemia. This information is not intended to replace advice given to you by your health care provider. Make sure you discuss any questions you have with your health care provider. Document Released: 10/24/2004 Document Revised: 08/29/2017 Document Reviewed: 10/18/2016 Elsevier Patient Education  2020 [employer].    ____________________________________________________________________________________________  ____________________________________________________________________________________________

## 2019-06-03 ENCOUNTER — Other Ambulatory Visit: Payer: Self-pay | Admitting: Orthopedic Surgery

## 2019-06-04 MED FILL — SERTRALINE HCL 100 MG TAB: 100 | 90 days supply | Qty: 90 | Fill #1

## 2019-06-08 NOTE — Telephone Encounter (Signed)
I have forwarded recent office note to Dr. Damita Dunnings office

## 2019-06-08 NOTE — Telephone Encounter (Signed)
° ° °  Please fax 9/2 office note to Dr [facility]  Date 10/05  FAX [phone] attn: Jon Gills

## 2019-06-14 DIAGNOSIS — H5213 Myopia, bilateral: Secondary | ICD-10-CM | POA: Diagnosis not present

## 2019-06-14 DIAGNOSIS — Z961 Presence of intraocular lens: Secondary | ICD-10-CM | POA: Diagnosis not present

## 2019-06-14 DIAGNOSIS — H524 Presbyopia: Secondary | ICD-10-CM | POA: Diagnosis not present

## 2019-06-14 DIAGNOSIS — H26492 Other secondary cataract, left eye: Secondary | ICD-10-CM | POA: Diagnosis not present

## 2019-06-15 MED FILL — ALENDRONATE NA 70 MG TAB: 70 | 28 days supply | Qty: 4 | Fill #1

## 2019-06-17 DIAGNOSIS — Z23 Encounter for immunization: Secondary | ICD-10-CM | POA: Diagnosis not present

## 2019-06-17 MED FILL — ATORVASTATIN 40 MG TABLET: 40 | 30 days supply | Qty: 30 | Fill #0

## 2019-06-17 MED FILL — METOPROLOL SUCCINATE ER 25: 25 | 30 days supply | Qty: 30 | Fill #0

## 2019-06-18 MED FILL — buPROPion HCL ER (XL) 300 M: 300 | 90 days supply | Qty: 90 | Fill #0

## 2019-06-18 MED FILL — NITROFURANTOIN MONO-MCR 100: 100 | 15 days supply | Qty: 30 | Fill #3

## 2019-06-25 ENCOUNTER — Encounter (HOSPITAL_COMMUNITY): Payer: Self-pay

## 2019-06-25 NOTE — Patient Instructions (Addendum)
DUE TO COVID-19 ONLY ONE VISITOR IS ALLOWED TO COME WITH YOU AND STAY IN THE WAITING ROOM ONLY DURING PRE OP AND PROCEDURE. THE ONE VISITOR MAY VISIT WITH YOU IN YOUR PRIVATE ROOM DURING VISITING HOURS ONLY!!   COVID SWAB TESTING MUST BE COMPLETED ON: Friday, Oct. 2, 2020 at 9:30AM.    [address], [place] -[facility] enter pre surgical testing line (Must self quarantine after testing. Follow instructions on handout.)             Your procedure is scheduled on: Monday, Oct. 5, 2020   Report to [facility] Main  Entrance   Report to Short Stay at 5:30 AM   Call this number if you have problems the morning of surgery [phone]   Do not eat food:After Midnight.   May have liquids until 4:30 AM day of surgery   CLEAR LIQUID DIET  Foods Allowed                                                                     Foods Excluded  Water, Black Coffee and tea, regular and decaf                             liquids that you cannot  Plain Jell-O in any flavor  (No red)                                           see through such as: Fruit ices (not with fruit pulp)                                     milk, soups, orange juice  Iced Popsicles (No red)                                    All solid food Carbonated beverages, regular and diet                                    Apple juices Sports drinks like Gatorade (No red) Lightly seasoned clear broth or consume(fat free) Sugar, honey syrup  Sample Menu Breakfast                                Lunch                                     Supper Cranberry juice                    Beef broth                            Chicken broth Jell-O  Grape juice                           Apple juice Coffee or tea                        Jell-O                                      Popsicle                                                Coffee or tea                        Coffee or  tea   Complete one Ensure drink the morning of surgery at 4:40AM the day of surgery.   Brush your teeth the morning of surgery.   Do NOT smoke after Midnight   Take these medicines the morning of surgery with A SIP OF WATER: Wellbutrin, Metoprolol, Alprazolam if neeeded                               You may not have any metal on your body including hair pins, jewelry, and body piercings             Do not wear make-up, lotions, powders, perfumes/cologne, or deodorant             Do not wear nail polish.  Do not shave  48 hours prior to surgery.             Do not bring valuables to the hospital. [place].   Contacts, dentures or bridgework may not be worn into surgery.   Bring small overnight bag day of surgery.    Special Instructions: Bring a copy of your healthcare power of attorney and living will documents         the day of surgery if you haven't scanned them in before.              Please read over the following fact sheets you were given:  [facility] - Preparing for Surgery Before surgery, you can play an important role.  Because skin is not sterile, your skin needs to be as free of germs as possible.  You can reduce the number of germs on your skin by washing with CHG (chlorahexidine gluconate) soap before surgery.  CHG is an antiseptic cleaner which kills germs and bonds with the skin to continue killing germs even after washing. Please DO NOT use if you have an allergy to CHG or antibacterial soaps.  If your skin becomes reddened/irritated stop using the CHG and inform your nurse when you arrive at Short Stay. Do not shave (including legs and underarms) for at least 48 hours prior to the first CHG shower.  You may shave your face/neck.  Please follow these instructions carefully:  1.  Shower with CHG Soap the night before surgery and the  morning of surgery.  2.  If you choose to wash  your hair, wash your hair first as  usual with your normal  shampoo.  3.  After you shampoo, rinse your hair and body thoroughly to remove the shampoo.                             4.  Use CHG as you would any other liquid soap.  You can apply chg directly to the skin and wash.  Gently with a scrungie or clean washcloth.  5.  Apply the CHG Soap to your body ONLY FROM THE NECK DOWN.   Do   not use on face/ open                           Wound or open sores. Avoid contact with eyes, ears mouth and   genitals (private parts).                       Wash face,  Genitals (private parts) with your normal soap.             6.  Wash thoroughly, paying special attention to the area where your    surgery  will be performed.  7.  Thoroughly rinse your body with warm water from the neck down.  8.  DO NOT shower/wash with your normal soap after using and rinsing off the CHG Soap.                9.  Pat yourself dry with a clean towel.            10.  Wear clean pajamas.            11.  Place clean sheets on your bed the night of your first shower and do not  sleep with pets. Day of Surgery : Do not apply any lotions/deodorants the morning of surgery.  Please wear clean clothes to the hospital/surgery center.  FAILURE TO FOLLOW THESE INSTRUCTIONS MAY RESULT IN THE CANCELLATION OF YOUR SURGERY  PATIENT SIGNATURE_________________________________  NURSE SIGNATURE__________________________________  ________________________________________________________________________   Cynthia [name]  An incentive spirometer is a tool that can help keep your lungs clear and active. This tool measures how well you are filling your lungs with each breath. Taking long deep breaths may help reverse or decrease the chance of developing breathing (pulmonary) problems (especially infection) following:  A long period of time when you are unable to move or be active. BEFORE THE PROCEDURE   If the spirometer includes an indicator to show your best effort,  your nurse or respiratory therapist will set it to a desired goal.  If possible, sit up straight or lean slightly forward. Try not to slouch.  Hold the incentive spirometer in an upright position. INSTRUCTIONS FOR USE  1. Sit on the edge of your bed if possible, or sit up as far as you can in bed or on a chair. 2. Hold the incentive spirometer in an upright position. 3. Breathe out normally. 4. Place the mouthpiece in your mouth and seal your lips tightly around it. 5. Breathe in slowly and as deeply as possible, raising the piston or the ball toward the top of the column. 6. Hold your breath for 3-5 seconds or for as long as possible. Allow the piston or ball to fall to the bottom of the column. 7. Remove the mouthpiece from your mouth and breathe out normally. 8.  Rest for a few seconds and repeat Steps 1 through 7 at least 10 times every 1-2 hours when you are awake. Take your time and take a few normal breaths between deep breaths. 9. The spirometer may include an indicator to show your best effort. Use the indicator as a goal to work toward during each repetition. 10. After each set of 10 deep breaths, practice coughing to be sure your lungs are clear. If you have an incision (the cut made at the time of surgery), support your incision when coughing by placing a pillow or rolled up towels firmly against it. Once you are able to get out of bed, walk around indoors and cough well. You may stop using the incentive spirometer when instructed by your caregiver.  RISKS AND COMPLICATIONS  Take your time so you do not get dizzy or light-headed.  If you are in pain, you may need to take or ask for pain medication before doing incentive spirometry. It is harder to take a deep breath if you are having pain. AFTER USE  Rest and breathe slowly and easily.  It can be helpful to keep track of a log of your progress. Your caregiver can provide you with a simple table to help with this. If you are  using the spirometer at home, follow these instructions: [place] IF:   You are having difficultly using the spirometer.  You have trouble using the spirometer as often as instructed.  Your pain medication is not giving enough relief while using the spirometer.  You develop fever of 100.5 F (38.1 C) or higher. SEEK IMMEDIATE MEDICAL CARE IF:   You cough up bloody sputum that had not been present before.  You develop fever of 102 F (38.9 C) or greater.  You develop worsening pain at or near the incision site. MAKE SURE YOU:   Understand these instructions.  Will watch your condition.  Will get help right away if you are not doing well or get worse. Document Released: 01/27/2007 Document Revised: 12/09/2011 Document Reviewed: 03/30/2007 ExitCare Patient Information 2014 ExitCare, [place].   ________________________________________________________________________  WHAT IS A BLOOD TRANSFUSION? Blood Transfusion Information  A transfusion is the replacement of blood or some of its parts. Blood is made up of multiple cells which provide different functions.  Red blood cells carry oxygen and are used for blood loss replacement.  White blood cells fight against infection.  Platelets control bleeding.  Plasma helps clot blood.  Other blood products are available for specialized needs, such as hemophilia or other clotting disorders. BEFORE THE TRANSFUSION  Who gives blood for transfusions?   Healthy volunteers who are fully evaluated to make sure their blood is safe. This is blood bank blood. Transfusion therapy is the safest it has ever been in the practice of medicine. Before blood is taken from a donor, a complete history is taken to make sure that person has no history of diseases nor engages in risky social behavior (examples are intravenous drug use or sexual activity with multiple partners). The donor's travel history is screened to minimize risk of transmitting  infections, such as malaria. The donated blood is tested for signs of infectious diseases, such as HIV and hepatitis. The blood is then tested to be sure it is compatible with you in order to minimize the chance of a transfusion reaction. If you or a relative donates blood, this is often done in anticipation of surgery and is not appropriate for emergency situations. It takes  many days to process the donated blood. RISKS AND COMPLICATIONS Although transfusion therapy is very safe and saves many lives, the main dangers of transfusion include:   Getting an infectious disease.  Developing a transfusion reaction. This is an allergic reaction to something in the blood you were given. Every precaution is taken to prevent this. The decision to have a blood transfusion has been considered carefully by your caregiver before blood is given. Blood is not given unless the benefits outweigh the risks. AFTER THE TRANSFUSION  Right after receiving a blood transfusion, you will usually feel much better and more energetic. This is especially true if your red blood cells have gotten low (anemic). The transfusion raises the level of the red blood cells which carry oxygen, and this usually causes an energy increase.  The nurse administering the transfusion will monitor you carefully for complications. HOME CARE INSTRUCTIONS  No special instructions are needed after a transfusion. You may find your energy is better. Speak with your caregiver about any limitations on activity for underlying diseases you may have. SEEK MEDICAL CARE IF:   Your condition is not improving after your transfusion.  You develop redness or irritation at the intravenous (IV) site. SEEK IMMEDIATE MEDICAL CARE IF:  Any of the following symptoms occur over the next 12 hours:  Shaking chills.  You have a temperature by mouth above 102 F (38.9 C), not controlled by medicine.  Chest, back, or muscle pain.  People around you feel you are  not acting correctly or are confused.  Shortness of breath or difficulty breathing.  Dizziness and fainting.  You get a rash or develop hives.  You have a decrease in urine output.  Your urine turns a dark color or changes to pink, red, or brown. Any of the following symptoms occur over the next 10 days:  You have a temperature by mouth above 102 F (38.9 C), not controlled by medicine.  Shortness of breath.  Weakness after normal activity.  The white part of the eye turns yellow (jaundice).  You have a decrease in the amount of urine or are urinating less often.  Your urine turns a dark color or changes to pink, red, or brown. Document Released: 09/13/2000 Document Revised: 12/09/2011 Document Reviewed: 05/02/2008 [facility] Patient Information 2014 [place], [place].  _______________________________________________________________________

## 2019-06-29 ENCOUNTER — Encounter (HOSPITAL_COMMUNITY)
Admission: RE | Admit: 2019-06-29 | Discharge: 2019-06-29 | Disposition: A | Discharge status: Home / Self Care | Payer: Medicare Other | Source: Ambulatory Visit | Attending: Orthopedic Surgery | Admitting: Orthopedic Surgery

## 2019-06-29 ENCOUNTER — Encounter (HOSPITAL_COMMUNITY): Payer: Self-pay

## 2019-06-29 ENCOUNTER — Other Ambulatory Visit: Payer: Self-pay

## 2019-06-29 ENCOUNTER — Encounter (INDEPENDENT_AMBULATORY_CARE_PROVIDER_SITE_OTHER): Payer: Self-pay

## 2019-06-29 DIAGNOSIS — M1711 Unilateral primary osteoarthritis, right knee: Secondary | ICD-10-CM | POA: Insufficient documentation

## 2019-06-29 DIAGNOSIS — I10 Essential (primary) hypertension: Secondary | ICD-10-CM | POA: Diagnosis not present

## 2019-06-29 DIAGNOSIS — I471 Supraventricular tachycardia: Secondary | ICD-10-CM | POA: Diagnosis not present

## 2019-06-29 DIAGNOSIS — Z79899 Other long term (current) drug therapy: Secondary | ICD-10-CM | POA: Diagnosis not present

## 2019-06-29 DIAGNOSIS — I350 Nonrheumatic aortic (valve) stenosis: Secondary | ICD-10-CM | POA: Diagnosis not present

## 2019-06-29 DIAGNOSIS — Z7982 Long term (current) use of aspirin: Secondary | ICD-10-CM | POA: Insufficient documentation

## 2019-06-29 DIAGNOSIS — F419 Anxiety disorder, unspecified: Secondary | ICD-10-CM | POA: Insufficient documentation

## 2019-06-29 DIAGNOSIS — Z85828 Personal history of other malignant neoplasm of skin: Secondary | ICD-10-CM | POA: Insufficient documentation

## 2019-06-29 DIAGNOSIS — Z01812 Encounter for preprocedural laboratory examination: Secondary | ICD-10-CM | POA: Insufficient documentation

## 2019-06-29 DIAGNOSIS — I2510 Atherosclerotic heart disease of native coronary artery without angina pectoris: Secondary | ICD-10-CM | POA: Diagnosis not present

## 2019-06-29 DIAGNOSIS — D649 Anemia, unspecified: Secondary | ICD-10-CM | POA: Insufficient documentation

## 2019-06-29 DIAGNOSIS — F329 Major depressive disorder, single episode, unspecified: Secondary | ICD-10-CM | POA: Insufficient documentation

## 2019-06-29 HISTORY — DX: Supraventricular tachycardia: I47.1

## 2019-06-29 HISTORY — DX: Supraventricular tachycardia, unspecified: I47.10

## 2019-06-29 HISTORY — DX: Basal cell carcinoma of skin, unspecified: C44.91

## 2019-06-29 HISTORY — DX: Anemia, unspecified: D64.9

## 2019-06-29 HISTORY — DX: Atherosclerotic heart disease of native coronary artery without angina pectoris: I25.10

## 2019-06-29 LAB — CBC WITH DIFFERENTIAL/PLATELET
Abs Immature Granulocytes: 0.02 10*3/uL (ref 0.00–0.07)
Basophils Absolute: 0.1 10*3/uL (ref 0.0–0.1)
Basophils Relative: 1 %
Eosinophils Absolute: 0.4 10*3/uL (ref 0.0–0.5)
Eosinophils Relative: 5 %
HCT: 39.1 % (ref 36.0–46.0)
Hemoglobin: 12.4 g/dL (ref 12.0–15.0)
Immature Granulocytes: 0 %
Lymphocytes Relative: 32 %
Lymphs Abs: 2.4 10*3/uL (ref 0.7–4.0)
MCH: 27.4 pg (ref 26.0–34.0)
MCHC: 31.7 g/dL (ref 30.0–36.0)
MCV: 86.3 fL (ref 80.0–100.0)
Monocytes Absolute: 0.6 10*3/uL (ref 0.1–1.0)
Monocytes Relative: 8 %
Neutro Abs: 4.1 10*3/uL (ref 1.7–7.7)
Neutrophils Relative %: 54 %
Platelets: 253 10*3/uL (ref 150–400)
RBC: 4.53 MIL/uL (ref 3.87–5.11)
RDW: 15 % (ref 11.5–15.5)
WBC: 7.5 10*3/uL (ref 4.0–10.5)
nRBC: 0 % (ref 0.0–0.2)

## 2019-06-29 LAB — URINALYSIS, ROUTINE W REFLEX MICROSCOPIC
Bilirubin Urine: NEGATIVE
Glucose, UA: NEGATIVE mg/dL
Hgb urine dipstick: NEGATIVE
Ketones, ur: NEGATIVE mg/dL
Leukocytes,Ua: NEGATIVE
Nitrite: NEGATIVE
Protein, ur: NEGATIVE mg/dL
Specific Gravity, Urine: 1.025 (ref 1.005–1.030)
pH: 6 (ref 5.0–8.0)

## 2019-06-29 LAB — ABO/RH: ABO/RH(D): A NEG

## 2019-06-29 LAB — BASIC METABOLIC PANEL
Anion gap: 9 (ref 5–15)
BUN: 17 mg/dL (ref 8–23)
CO2: 24 mmol/L (ref 22–32)
Calcium: 9.4 mg/dL (ref 8.9–10.3)
Chloride: 105 mmol/L (ref 98–111)
Creatinine, Ser: 0.74 mg/dL (ref 0.44–1.00)
GFR calc Af Amer: >60 mL/min (ref >60)
GFR calc non Af Amer: >60 mL/min (ref >60)
Glucose, Bld: 117 mg/dL — ABNORMAL HIGH (ref 70–99)
Potassium: 4.4 mmol/L (ref 3.5–5.1)
Sodium: 138 mmol/L (ref 135–145)

## 2019-06-29 LAB — SURGICAL PCR SCREEN
MRSA, PCR: NEGATIVE
Staphylococcus aureus: POSITIVE — AB

## 2019-06-29 LAB — PROTIME-INR
INR: 1 (ref 0.8–1.2)
Prothrombin Time: 13.3 seconds (ref 11.4–15.2)

## 2019-06-29 LAB — APTT: aPTT: 30 seconds (ref 24–36)

## 2019-06-29 NOTE — Progress Notes (Signed)
Left a message at Dr. Damita Dunnings office, Cynthia [name] stated her right knee will be replaced on Oct. 5, 2020.  I requested updated consent in epic.

## 2019-06-29 NOTE — Progress Notes (Signed)
SPOKE W/  Cynthia [name]     SCREENING SYMPTOMS OF COVID 19:   COUGH--NO  RUNNY NOSE--- NO  SORE THROAT---NO  NASAL CONGESTION----NO  SNEEZING----NO  SHORTNESS OF BREATH---NO  DIFFICULTY BREATHING---NO  TEMP >100.0 -----NO  UNEXPLAINED BODY ACHES------NO  CHILLS -------- NO  HEADACHES ---------NO  LOSS OF SMELL/ TASTE --------NO    HAVE YOU OR ANY FAMILY MEMBER TRAVELLED PAST 14 DAYS OUT OF THE   COUNTY---Travelled to [facility]----NO COUNTRY----NO  HAVE YOU OR ANY FAMILY MEMBER BEEN EXPOSED TO ANYONE WITH COVID 19? NO

## 2019-06-29 NOTE — Progress Notes (Signed)
PCP - Dr. Maurice Small Cardiologist - Dr. Glenetta Hew cardiac clearance note by A. Duke P.A. 06/02/2019 in epic  Chest x-ray - 05/24/2019 in epic EKG - 06/02/2019  in epic Stress Test - N/A ECHO - 05/24/2019 in epic Cardiac Cath - 05/24/2019 in epic  Sleep Study -  N/A CPAP - N/A  Fasting Blood Sugar - N/A Checks Blood Sugar _N/A____ times a day  Blood Thinner Instructions: N/A Aspirin Instructions: None from doctor Last Dose: 06/28/2019  Anesthesia review: CAD  Patient denies shortness of breath, fever, cough and chest pain at PAT appointment   Patient verbalized understanding of instructions that were given to them at the PAT appointment. Patient was also instructed that they will need to review over the PAT instructions again at home before surgery.

## 2019-06-30 DIAGNOSIS — H26492 Other secondary cataract, left eye: Secondary | ICD-10-CM | POA: Diagnosis not present

## 2019-06-30 HISTORY — PX: OTHER SURGICAL HISTORY: SHX169

## 2019-06-30 NOTE — Progress Notes (Signed)
Anesthesia Chart Review   Case: [id number] Date/Time: 07/05/19 0700   Procedure: RIGHT TOTAL KNEE ARTHROPLASTY (Right Knee)   Anesthesia type: Spinal   Pre-op diagnosis: RIGHT KNEE OSTEOARTHRITIS VALGUS   Location: [facility] [facility]   Surgeon: Frederik Pear, MD      DISCUSSION:68 y.o. never smoker with h/o GERD, HTN, CAD, moderate aortic stenosis, right knee OA scheduled for above procedure 07/05/2019 with Dr. Frederik Pear.   Pt last seen by cardiology 06/02/2019.  Seen by Fabian Sharp, PA-C.  Per OV note, "Preoperative clearance: She can't complete more than 4.0 METS - mobility is limited by her knee pain. However, she just had heart cath which revealed nonobstructive disease, no intervention. "STEMI" was an inaccurate diagnosis. She is not diabetic and has normal renal function. Surgery will be performed using epidural and not general anesthesia. According to the RCRI, she has 0.9% risk of a major cardiac event in the perioperative period. OK to hold ASA for 7 days prior to surgery if needed. I will route this recommendation to Dr. [facility]."  Anticipate pt can proceed with planned procedure barring acute status change.   VS: BP 132/78 (BP Location: Left Arm)   Pulse 65   Temp 36.6 C (Oral)   Resp 18   Ht 5\' 5"  (1.651 m)   Wt 106.9 kg   SpO2 99%   BMI 39.20 kg/m   PROVIDERS: Maurice Small, MD is PCP   Glenetta Hew, MD is Cardiologist  LABS: Labs reviewed: Acceptable for surgery. (all labs ordered are listed, but only abnormal results are displayed)  Labs Reviewed  SURGICAL PCR SCREEN - Abnormal; Notable for the following components:      Result Value   Staphylococcus aureus POSITIVE (*)    All other components within normal limits  BASIC METABOLIC PANEL - Abnormal; Notable for the following components:   Glucose, Bld 117 (*)    All other components within normal limits  URINALYSIS, ROUTINE W REFLEX MICROSCOPIC - Abnormal; Notable for the following  components:   APPearance HAZY (*)    All other components within normal limits  APTT  CBC WITH DIFFERENTIAL/PLATELET  PROTIME-INR  TYPE AND SCREEN  ABO/RH     IMAGES: Chest Xray 05/24/2019 IMPRESSION: 1. Mild widening of the upper mediastinum, this may be related to AP portable technique. PA and lateral chest x-ray suggested for further evaluation.  2.  Mild cardiomegaly.  Mild pulmonary venous congestion.  3.  Mild bibasilar atelectasis.  EKG: 06/02/2019 61 bpm Poor data quality, interpretation may be adversely affected Normal sinus rhythm  Normal ECG   CV: Cardiac Cath 05/24/2019 Conclusions: 1. Moderate to severe, non-critical single-vessel coronary artery disease with 60-70% proximal LAD stenosis at origin of large diagonal branch ([place] 1,0,0). 2. Low normal left ventricular systolic function with subtle anterior hypokinesis. 3. Mildly elevated left ventricular filling pressure.  Recommendations: 1. Medical therapy for coronary artery disease and supraventricular tachycardia.  I suspect chest pain and elevated HS-TnI are manifestations of demand ischemia.  Of note, inferior ST elevation had resolved by the time the patient arrived in the cath lab based on EMS EKG and chest pain had abated by the end of the case. 2. If patient has recurrent angina, consider functional study to assess hemodynamic significance of proximal LAD stenosis. 3. Obtain transthoracic echocardiogram to confirm left ventricular systolic function and to exclude other structural abnormalities. 4. Consider electrophysiology consultation if recurrent SVT is noted despite medical therapy.  Echo  05/24/2019 IMPRESSIONS    1. The left ventricle has normal systolic function, with an ejection fraction of 55-60%. The cavity size was normal. Left ventricular diastolic parameters were normal. No evidence of left ventricular regional wall motion abnormalities.  2. The aortic valve is tricuspid. Moderate  sclerosis of the aortic valve.  3. The aorta is normal unless otherwise noted. AORTIC VALVE                   +------------------+------------++ AV Area (Vmax):   2.40 cm     +------------------+------------++ AV Area (Vmean):  2.45 cm     +------------------+------------++ AV Area (VTI):    2.47 cm     +------------------+------------++ AV Vmax:          134.00 cm/s  +------------------+------------++ AV Vmean:         100.000 cm/s +------------------+------------++ AV VTI:           0.279 m      +------------------+------------++ AV Peak Grad:     7.2 mmHg     +------------------+------------++ AV Mean Grad:     4.0 mmHg     Past Medical History:  Diagnosis Date  . Anemia   . Anxiety disorder   . Aortic valve sclerosis 05/24/2019   Moderate noted on ECHO  . Arthritis    right knee  . Cancer of the skin, basal cell   . Coronary artery disease    Moderate to severe, non-critical single-vessel coronary artery disease with 60-70% proximal LAD stenosis at origin of large diagonal branch ([place] 1,0,0).  . Demand ischemia ([place]) 05/24/2019  . Depression   . GERD (gastroesophageal reflux disease)    TUMS  . Hypertension   . Lateral meniscus derangement, right   . SVT (supraventricular tachycardia) (HCC)     Past Surgical History:  Procedure Laterality Date  . CATARACT EXTRACTION, BILATERAL    . CESAREAN SECTION     x3  . CHONDROPLASTY Right 02/21/2017   Procedure: CHONDROPLASTY;  Surgeon: Melrose Nakayama, MD;  Location: [facility];  Service: Orthopedics;  Laterality: Right;  . COLONOSCOPY    . KNEE ARTHROSCOPY WITH LATERAL MENISECTOMY Right 02/21/2017   Procedure: KNEE ARTHROSCOPY WITH LATERAL MENISECTOMY;  Surgeon: Melrose Nakayama, MD;  Location: [facility];  Service: Orthopedics;  Laterality: Right;  . KNEE ARTHROSCOPY WITH MEDIAL MENISECTOMY Right 02/21/2017   Procedure: KNEE ARTHROSCOPY WITH MEDIAL  MENISECTOMY;  Surgeon: Melrose Nakayama, MD;  Location: [facility];  Service: Orthopedics;  Laterality: Right;  . LEFT HEART CATH AND CORONARY ANGIOGRAPHY N/A 05/24/2019   Procedure: LEFT HEART CATH AND CORONARY ANGIOGRAPHY;  Surgeon: Nelva Bush, MD;  Location: [facility];  Service: Cardiovascular;  Laterality: N/A;  . MOUTH SURGERY      MEDICATIONS: . alendronate (FOSAMAX) 70 MG tablet  . ALPRAZolam (XANAX) 0.25 MG tablet  . aspirin EC 81 MG EC tablet  . atorvastatin (LIPITOR) 40 MG tablet  . buPROPion (WELLBUTRIN XL) 300 MG 24 hr tablet  . Calcium Carbonate (CALCIUM 500 PO)  . ferrous sulfate 325 (65 FE) MG tablet  . metoprolol succinate (TOPROL-XL) 25 MG 24 hr tablet  . Multiple Vitamins-Minerals (MULTIVITAMIN ADULT PO)  . nitrofurantoin (MACRODANTIN) 100 MG capsule  . nitroGLYCERIN (NITROSTAT) 0.4 MG SL tablet  . sertraline (ZOLOFT) 50 MG tablet   No current facility-administered medications for this encounter.      [facility] Pre-Surgical Testing [phone] 06/30/19  12:35 PM

## 2019-07-01 MED FILL — MUPIROCIN 2% OINTMENT: 2 | 5 days supply | Qty: 22 | Fill #0

## 2019-07-02 ENCOUNTER — Other Ambulatory Visit (HOSPITAL_COMMUNITY)
Admission: RE | Admit: 2019-07-02 | Discharge: 2019-07-02 | Disposition: A | Discharge status: Home / Self Care | Payer: Medicare Other | Source: Ambulatory Visit | Attending: Orthopedic Surgery | Admitting: Orthopedic Surgery

## 2019-07-02 DIAGNOSIS — Z01812 Encounter for preprocedural laboratory examination: Secondary | ICD-10-CM | POA: Insufficient documentation

## 2019-07-02 DIAGNOSIS — M1711 Unilateral primary osteoarthritis, right knee: Secondary | ICD-10-CM | POA: Diagnosis not present

## 2019-07-02 DIAGNOSIS — Z20828 Contact with and (suspected) exposure to other viral communicable diseases: Secondary | ICD-10-CM | POA: Insufficient documentation

## 2019-07-02 NOTE — H&P (Signed)
TOTAL KNEE ADMISSION H&P  Patient is being admitted for right total knee arthroplasty.  Subjective:  Chief Complaint:right knee pain.  HPI: Cynthia [name], 68 y.o. female, has a history of pain and functional disability in the right knee due to arthritis and has failed non-surgical conservative treatments for greater than 12 weeks to includeNSAID's and/or analgesics, corticosteriod injections, viscosupplementation injections, use of assistive devices, weight reduction as appropriate and activity modification.  Onset of symptoms was gradual, starting 2 years ago with gradually worsening course since that time. The patient noted prior procedures on the knee to include  arthroscopy on the right knee(s).  Patient currently rates pain in the right knee(s) at 10 out of 10 with activity. Patient has night pain, worsening of pain with activity and weight bearing, pain that interferes with activities of daily living, pain with passive range of motion, crepitus and joint swelling.  Patient has evidence of joint space narrowing and subchondral collapse by imaging studies. There is no active infection.  Patient Active Problem List   Diagnosis Date Noted  . Anxiety 05/27/2019  . Candidal vulvovaginitis 05/27/2019  . Candidiasis of skin 05/27/2019  . Chronic cystitis 05/27/2019  . Hypertensive disorder 05/27/2019  . Musculoskeletal pain 05/27/2019  . Obesity with body mass index 30 or greater 05/27/2019  . Overactive bladder 05/27/2019  . Demand ischemia ([place]) 05/25/2019  . Coronary artery disease, 60% LAD prior to D1 05/25/2019  . STEMI (ST elevation myocardial infarction) ([place]) 05/24/2019  . SVT (supraventricular tachycardia) ([place]) 05/24/2019   Past Medical History:  Diagnosis Date  . Anemia   . Anxiety disorder   . Aortic valve sclerosis 05/24/2019   Moderate noted on ECHO  . Arthritis    right knee  . Cancer of the skin, basal cell   . Coronary artery disease    Moderate to severe,  non-critical single-vessel coronary artery disease with 60-70% proximal LAD stenosis at origin of large diagonal branch ([place] 1,0,0).  . Demand ischemia ([place]) 05/24/2019  . Depression   . GERD (gastroesophageal reflux disease)    TUMS  . Hypertension   . Lateral meniscus derangement, right   . SVT (supraventricular tachycardia) ([facility])     Past Surgical History:  Procedure Laterality Date  . CATARACT EXTRACTION, BILATERAL    . CESAREAN SECTION     x3  . CHONDROPLASTY Right 02/21/2017   Procedure: CHONDROPLASTY;  Surgeon: Melrose Nakayama, MD;  Location: [facility];  Service: Orthopedics;  Laterality: Right;  . COLONOSCOPY    . KNEE ARTHROSCOPY WITH LATERAL MENISECTOMY Right 02/21/2017   Procedure: KNEE ARTHROSCOPY WITH LATERAL MENISECTOMY;  Surgeon: Melrose Nakayama, MD;  Location: [place];  Service: Orthopedics;  Laterality: Right;  . KNEE ARTHROSCOPY WITH MEDIAL MENISECTOMY Right 02/21/2017   Procedure: KNEE ARTHROSCOPY WITH MEDIAL MENISECTOMY;  Surgeon: Melrose Nakayama, MD;  Location: [place];  Service: Orthopedics;  Laterality: Right;  . LEFT HEART CATH AND CORONARY ANGIOGRAPHY N/A 05/24/2019   Procedure: LEFT HEART CATH AND CORONARY ANGIOGRAPHY;  Surgeon: Nelva Bush, MD;  Location: [facility];  Service: Cardiovascular;  Laterality: N/A;  . MOUTH SURGERY      No current facility-administered medications for this encounter.    Current Outpatient Medications  Medication Sig Dispense Refill Last Dose  . alendronate (FOSAMAX) 70 MG tablet Take 70 mg by mouth once a week.      . ALPRAZolam (XANAX) 0.25 MG tablet Take 0.25 mg by mouth at bedtime as needed  for anxiety.     Marland Kitchen aspirin EC 81 MG EC tablet Take 1 tablet (81 mg total) by mouth daily. 30 tablet 11   . atorvastatin (LIPITOR) 40 MG tablet Take 1 tablet (40 mg total) by mouth daily at 6 PM. 30 tablet 6   . buPROPion (WELLBUTRIN XL) 300 MG 24 hr tablet Take 300 mg by mouth  daily.     . Calcium Carbonate (CALCIUM 500 PO) Take 500 mg by mouth daily.      . ferrous sulfate 325 (65 FE) MG tablet Take 325 mg by mouth daily with breakfast.     . metoprolol succinate (TOPROL-XL) 25 MG 24 hr tablet Take 1 tablet (25 mg total) by mouth daily. 30 tablet 6   . Multiple Vitamins-Minerals (MULTIVITAMIN ADULT PO) Take 1 tablet by mouth daily.     . nitrofurantoin (MACRODANTIN) 100 MG capsule Take 100 mg by mouth daily as needed (UTI).      . nitroGLYCERIN (NITROSTAT) 0.4 MG SL tablet Place 1 tablet (0.4 mg total) under the tongue every 5 (five) minutes x 3 doses as needed for chest pain. 25 tablet 12   . sertraline (ZOLOFT) 50 MG tablet Take 50 mg by mouth daily.      Allergies  Allergen Reactions  . Penicillins Rash    Pt does not remember, states she was real young    Social History   Tobacco Use  . Smoking status: Never Smoker  . Smokeless tobacco: Never Used  Substance Use Topics  . Alcohol use: Yes    Comment: social    Family History  Problem Relation Age of Onset  . Hypertension Father      Review of Systems  Constitutional: Negative.   HENT: Negative.   Eyes: Positive for blurred vision.  Respiratory: Negative.   Cardiovascular:       Htn  Gastrointestinal: Negative.   Genitourinary: Negative.   Musculoskeletal: Positive for joint pain.  Skin: Negative.   Neurological: Negative.   Endo/Heme/Allergies: Negative.   Psychiatric/Behavioral: Positive for depression.    Objective:  Physical Exam  Constitutional: She is oriented to person, place, and time. She appears well-developed and well-nourished.  HENT:  Head: Normocephalic and atraumatic.  Eyes: Pupils are equal, round, and reactive to light.  Neck: Normal range of motion. Neck supple.  Cardiovascular: Intact distal pulses.  Respiratory: Effort normal.  Musculoskeletal:        General: Tenderness present.     Comments: Patient walks with a right greater than left antalgic gait.  Both  knees come to full extension both flex 120 limited by adipose tissue 5-10 valgus deformity collateral ligaments are stable neurovascular intact distally.  Skin is intact.  Very little in the way of the fat pad over the patella.    Neurological: She is alert and oriented to person, place, and time.  Skin: Skin is warm and dry.  Psychiatric: She has a normal mood and affect. Her behavior is normal. Judgment and thought content normal.    Vital signs in last 24 hours:    Labs:   Estimated body mass index is 39.2 kg/m as calculated from the following:   Height as of 06/29/19: 5\' 5"  (1.651 m).   Weight as of 06/29/19: 106.9 kg.   Imaging Review Plain radiographs demonstrate on the Lutricia Feil view she is down to bone-on-bone with some subchondral collapse of the tibia on the lateral side on the right and the lateral femur on the left.  Assessment/Plan:  End stage arthritis, right knee   The patient history, physical examination, clinical judgment of the provider and imaging studies are consistent with end stage degenerative joint disease of the right knee(s) and total knee arthroplasty is deemed medically necessary. The treatment options including medical management, injection therapy arthroscopy and arthroplasty were discussed at length. The risks and benefits of total knee arthroplasty were presented and reviewed. The risks due to aseptic loosening, infection, stiffness, patella tracking problems, thromboembolic complications and other imponderables were discussed. The patient acknowledged the explanation, agreed to proceed with the plan and consent was signed. Patient is being admitted for inpatient treatment for surgery, pain control, PT, OT, prophylactic antibiotics, VTE prophylaxis, progressive ambulation and ADL's and discharge planning. The patient is planning to be discharged home with home health services     Patient's anticipated LOS is less than 2 midnights, meeting these  requirements: - Younger than 40 - Lives within 1 hour of care - Has a competent adult at home to recover with post-op recover - NO history of  - Chronic pain requiring opiods  - Diabetes  - Coronary Artery Disease  - Heart failure  - Heart attack  - Stroke  - DVT/VTE  - Cardiac arrhythmia  - Respiratory Failure/COPD  - Renal failure  - Anemia  - Advanced Liver disease

## 2019-07-04 LAB — NOVEL CORONAVIRUS, NAA (HOSP ORDER, SEND-OUT TO REF LAB; TAT 18-24 HRS): SARS-CoV-2, NAA: NOT DETECTED

## 2019-07-04 MED ORDER — TRANEXAMIC ACID 1000 MG/10ML IV SOLN
2000.0000 mg | INTRAVENOUS | Status: DC
  Filled 2019-07-04: qty 20

## 2019-07-04 MED ORDER — BUPIVACAINE LIPOSOME 1.3 % IJ SUSP
20.0000 mL | INTRAMUSCULAR | Status: DC
  Filled 2019-07-04: qty 20

## 2019-07-05 ENCOUNTER — Ambulatory Visit (HOSPITAL_COMMUNITY): Payer: Medicare Other | Admitting: Anesthesiology

## 2019-07-05 ENCOUNTER — Encounter (HOSPITAL_COMMUNITY)
Admission: RE | Disposition: A | Discharge status: Home / Self Care | Payer: Self-pay | Source: Other Acute Inpatient Hospital | Attending: Orthopedic Surgery

## 2019-07-05 ENCOUNTER — Ambulatory Visit (HOSPITAL_COMMUNITY): Payer: Medicare Other

## 2019-07-05 ENCOUNTER — Observation Stay (HOSPITAL_COMMUNITY)
Admission: RE | Admit: 2019-07-05 | Discharge: 2019-07-06 | Disposition: A | Discharge status: Home / Self Care | Payer: Medicare Other | Source: Other Acute Inpatient Hospital | Attending: Orthopedic Surgery | Admitting: Orthopedic Surgery

## 2019-07-05 ENCOUNTER — Ambulatory Visit (HOSPITAL_COMMUNITY): Payer: Medicare Other | Admitting: Physician Assistant

## 2019-07-05 ENCOUNTER — Other Ambulatory Visit: Payer: Self-pay

## 2019-07-05 ENCOUNTER — Encounter (HOSPITAL_COMMUNITY): Payer: Self-pay | Admitting: *Deleted

## 2019-07-05 DIAGNOSIS — Z96652 Presence of left artificial knee joint: Secondary | ICD-10-CM

## 2019-07-05 DIAGNOSIS — I214 Non-ST elevation (NSTEMI) myocardial infarction: Secondary | ICD-10-CM | POA: Diagnosis not present

## 2019-07-05 DIAGNOSIS — D649 Anemia, unspecified: Secondary | ICD-10-CM | POA: Insufficient documentation

## 2019-07-05 DIAGNOSIS — I2510 Atherosclerotic heart disease of native coronary artery without angina pectoris: Secondary | ICD-10-CM | POA: Diagnosis not present

## 2019-07-05 DIAGNOSIS — I10 Essential (primary) hypertension: Secondary | ICD-10-CM | POA: Insufficient documentation

## 2019-07-05 DIAGNOSIS — I471 Supraventricular tachycardia: Secondary | ICD-10-CM | POA: Insufficient documentation

## 2019-07-05 DIAGNOSIS — M21061 Valgus deformity, not elsewhere classified, right knee: Secondary | ICD-10-CM | POA: Diagnosis not present

## 2019-07-05 DIAGNOSIS — F419 Anxiety disorder, unspecified: Secondary | ICD-10-CM | POA: Insufficient documentation

## 2019-07-05 DIAGNOSIS — E119 Type 2 diabetes mellitus without complications: Secondary | ICD-10-CM | POA: Insufficient documentation

## 2019-07-05 DIAGNOSIS — K219 Gastro-esophageal reflux disease without esophagitis: Secondary | ICD-10-CM | POA: Diagnosis not present

## 2019-07-05 DIAGNOSIS — Z7982 Long term (current) use of aspirin: Secondary | ICD-10-CM | POA: Diagnosis not present

## 2019-07-05 DIAGNOSIS — G8918 Other acute postprocedural pain: Secondary | ICD-10-CM | POA: Diagnosis not present

## 2019-07-05 DIAGNOSIS — Z79899 Other long term (current) drug therapy: Secondary | ICD-10-CM | POA: Diagnosis not present

## 2019-07-05 DIAGNOSIS — Z6839 Body mass index (BMI) 39.0-39.9, adult: Secondary | ICD-10-CM | POA: Insufficient documentation

## 2019-07-05 DIAGNOSIS — M1711 Unilateral primary osteoarthritis, right knee: Secondary | ICD-10-CM | POA: Diagnosis not present

## 2019-07-05 DIAGNOSIS — E669 Obesity, unspecified: Secondary | ICD-10-CM | POA: Insufficient documentation

## 2019-07-05 DIAGNOSIS — Z880 Allergy status to penicillin: Secondary | ICD-10-CM | POA: Diagnosis not present

## 2019-07-05 DIAGNOSIS — D62 Acute posthemorrhagic anemia: Secondary | ICD-10-CM | POA: Insufficient documentation

## 2019-07-05 DIAGNOSIS — Z01818 Encounter for other preprocedural examination: Secondary | ICD-10-CM

## 2019-07-05 DIAGNOSIS — F329 Major depressive disorder, single episode, unspecified: Secondary | ICD-10-CM | POA: Insufficient documentation

## 2019-07-05 DIAGNOSIS — Z01811 Encounter for preprocedural respiratory examination: Secondary | ICD-10-CM | POA: Diagnosis not present

## 2019-07-05 DIAGNOSIS — Z85828 Personal history of other malignant neoplasm of skin: Secondary | ICD-10-CM | POA: Diagnosis not present

## 2019-07-05 HISTORY — PX: TOTAL KNEE ARTHROPLASTY: SHX125

## 2019-07-05 LAB — TYPE AND SCREEN
ABO/RH(D): A NEG
Antibody Screen: NEGATIVE

## 2019-07-05 SURGERY — ARTHROPLASTY, KNEE, TOTAL
Anesthesia: Spinal | Site: Knee | Laterality: Right

## 2019-07-05 MED ORDER — TIZANIDINE HCL 2 MG PO TABS: 2.0000 mg | ORAL_TABLET | Freq: Four times a day (QID) | ORAL | 0 refills | Status: DC | PRN

## 2019-07-05 MED ORDER — TRANEXAMIC ACID 1000 MG/10ML IV SOLN
INTRAVENOUS | Status: DC | PRN
  Administered 2019-07-05: 2000 mg via TOPICAL

## 2019-07-05 MED ORDER — PROMETHAZINE HCL 25 MG/ML IJ SOLN: 6.2500 mg | INTRAMUSCULAR | Status: DC | PRN

## 2019-07-05 MED ORDER — ROPIVACAINE HCL 5 MG/ML IJ SOLN
INTRAMUSCULAR | Status: DC | PRN
  Administered 2019-07-05: 20 mL via PERINEURAL

## 2019-07-05 MED ORDER — BUPROPION HCL ER (XL) 300 MG PO TB24
300.0000 mg | ORAL_TABLET | Freq: Every day | ORAL | Status: DC
  Administered 2019-07-06: 300 mg via ORAL
  Filled 2019-07-05: qty 1

## 2019-07-05 MED ORDER — BUPIVACAINE IN DEXTROSE 0.75-8.25 % IT SOLN
INTRATHECAL | Status: DC | PRN
  Administered 2019-07-05: 1.6 mL via INTRATHECAL

## 2019-07-05 MED ORDER — SODIUM CHLORIDE 0.9 % IR SOLN
Status: DC | PRN
  Administered 2019-07-05: 1

## 2019-07-05 MED ORDER — PANTOPRAZOLE SODIUM 40 MG PO TBEC
40.0000 mg | DELAYED_RELEASE_TABLET | Freq: Every day | ORAL | Status: DC
  Administered 2019-07-05 – 2019-07-06 (×2): 40 mg via ORAL
  Filled 2019-07-05 (×2): qty 1

## 2019-07-05 MED ORDER — ALPRAZOLAM 0.25 MG PO TABS: 0.2500 mg | ORAL_TABLET | Freq: Every evening | ORAL | Status: DC | PRN

## 2019-07-05 MED ORDER — BUPIVACAINE HCL (PF) 0.25 % IJ SOLN
INTRAMUSCULAR | Status: DC | PRN
  Administered 2019-07-05: 30 mL

## 2019-07-05 MED ORDER — METHOCARBAMOL 1000 MG/10ML IJ SOLN
500.0000 mg | Freq: Four times a day (QID) | INTRAVENOUS | Status: DC | PRN
  Filled 2019-07-05: qty 5

## 2019-07-05 MED ORDER — DEXAMETHASONE SODIUM PHOSPHATE 10 MG/ML IJ SOLN
INTRAMUSCULAR | Status: DC | PRN
  Administered 2019-07-05: 10 mg via INTRAVENOUS

## 2019-07-05 MED ORDER — PROPOFOL 500 MG/50ML IV EMUL
INTRAVENOUS | Status: DC | PRN
  Administered 2019-07-05: 75 ug/kg/min via INTRAVENOUS

## 2019-07-05 MED ORDER — PHENYLEPHRINE 40 MCG/ML (10ML) SYRINGE FOR IV PUSH (FOR BLOOD PRESSURE SUPPORT)
PREFILLED_SYRINGE | INTRAVENOUS | Status: DC | PRN
  Administered 2019-07-05 (×3): 80 ug via INTRAVENOUS

## 2019-07-05 MED ORDER — BISACODYL 5 MG PO TBEC: 5.0000 mg | DELAYED_RELEASE_TABLET | Freq: Every day | ORAL | Status: DC | PRN

## 2019-07-05 MED ORDER — MULTIVITAMIN ADULT PO TABS: ORAL_TABLET | Freq: Every day | ORAL | Status: DC

## 2019-07-05 MED ORDER — DEXAMETHASONE SODIUM PHOSPHATE 10 MG/ML IJ SOLN
INTRAMUSCULAR | Status: AC
  Filled 2019-07-05: qty 1

## 2019-07-05 MED ORDER — CHLORHEXIDINE GLUCONATE 4 % EX LIQD: 60.0000 mL | Freq: Once | CUTANEOUS | Status: DC

## 2019-07-05 MED ORDER — DOCUSATE SODIUM 100 MG PO CAPS
100.0000 mg | ORAL_CAPSULE | Freq: Two times a day (BID) | ORAL | Status: DC
  Administered 2019-07-05 – 2019-07-06 (×2): 100 mg via ORAL
  Filled 2019-07-05 (×2): qty 1

## 2019-07-05 MED ORDER — ASPIRIN EC 81 MG PO TBEC: 81.0000 mg | DELAYED_RELEASE_TABLET | Freq: Two times a day (BID) | ORAL | 0 refills | Status: DC

## 2019-07-05 MED ORDER — FENTANYL CITRATE (PF) 100 MCG/2ML IJ SOLN
INTRAMUSCULAR | Status: AC
  Filled 2019-07-05: qty 2

## 2019-07-05 MED ORDER — MIDAZOLAM HCL 2 MG/2ML IJ SOLN
INTRAMUSCULAR | Status: AC
  Filled 2019-07-05: qty 2

## 2019-07-05 MED ORDER — PROPOFOL 500 MG/50ML IV EMUL
INTRAVENOUS | Status: AC
  Filled 2019-07-05: qty 50

## 2019-07-05 MED ORDER — WATER FOR IRRIGATION, STERILE IR SOLN
Status: DC | PRN
  Administered 2019-07-05: 1000 mL

## 2019-07-05 MED ORDER — PHENYLEPHRINE 40 MCG/ML (10ML) SYRINGE FOR IV PUSH (FOR BLOOD PRESSURE SUPPORT)
PREFILLED_SYRINGE | INTRAVENOUS | Status: AC
  Filled 2019-07-05: qty 10

## 2019-07-05 MED ORDER — METOCLOPRAMIDE HCL 5 MG PO TABS: 5.0000 mg | ORAL_TABLET | Freq: Three times a day (TID) | ORAL | Status: DC | PRN

## 2019-07-05 MED ORDER — SODIUM CHLORIDE 0.9 % IV SOLN
INTRAVENOUS | Status: DC | PRN
  Administered 2019-07-05: 50 ug/min via INTRAVENOUS

## 2019-07-05 MED ORDER — NITROFURANTOIN MACROCRYSTAL 100 MG PO CAPS
100.0000 mg | ORAL_CAPSULE | Freq: Every day | ORAL | Status: DC | PRN
  Filled 2019-07-05: qty 1

## 2019-07-05 MED ORDER — TRANEXAMIC ACID-NACL 1000-0.7 MG/100ML-% IV SOLN
1000.0000 mg | Freq: Once | INTRAVENOUS | Status: AC
  Administered 2019-07-05: 1000 mg via INTRAVENOUS
  Filled 2019-07-05: qty 100

## 2019-07-05 MED ORDER — ONDANSETRON HCL 4 MG PO TABS: 4.0000 mg | ORAL_TABLET | Freq: Four times a day (QID) | ORAL | Status: DC | PRN

## 2019-07-05 MED ORDER — PROPOFOL 10 MG/ML IV BOLUS
INTRAVENOUS | Status: DC | PRN
  Administered 2019-07-05: 20 mg via INTRAVENOUS

## 2019-07-05 MED ORDER — SODIUM CHLORIDE (PF) 0.9 % IJ SOLN
INTRAMUSCULAR | Status: DC | PRN
  Administered 2019-07-05: 50 mL

## 2019-07-05 MED ORDER — POVIDONE-IODINE 10 % EX SWAB
2.0000 "application " | Freq: Once | CUTANEOUS | Status: AC
  Administered 2019-07-05: 2 via TOPICAL

## 2019-07-05 MED ORDER — METOPROLOL SUCCINATE ER 25 MG PO TB24
25.0000 mg | ORAL_TABLET | Freq: Every day | ORAL | Status: DC
  Administered 2019-07-06: 25 mg via ORAL
  Filled 2019-07-05: qty 1

## 2019-07-05 MED ORDER — HYDROMORPHONE HCL 1 MG/ML IJ SOLN
0.5000 mg | INTRAMUSCULAR | Status: DC | PRN
  Administered 2019-07-05: 0.5 mg via INTRAVENOUS
  Filled 2019-07-05 (×2): qty 1

## 2019-07-05 MED ORDER — ONDANSETRON HCL 4 MG/2ML IJ SOLN
INTRAMUSCULAR | Status: AC
  Filled 2019-07-05: qty 2

## 2019-07-05 MED ORDER — PROPOFOL 10 MG/ML IV BOLUS
INTRAVENOUS | Status: AC
  Filled 2019-07-05: qty 20

## 2019-07-05 MED ORDER — KCL IN DEXTROSE-NACL 20-5-0.45 MEQ/L-%-% IV SOLN
INTRAVENOUS | Status: DC
  Administered 2019-07-05: 12:00:00 via INTRAVENOUS
  Filled 2019-07-05 (×2): qty 1000

## 2019-07-05 MED ORDER — ASPIRIN 81 MG PO CHEW
81.0000 mg | CHEWABLE_TABLET | Freq: Two times a day (BID) | ORAL | Status: DC
  Administered 2019-07-05 – 2019-07-06 (×2): 81 mg via ORAL
  Filled 2019-07-05 (×2): qty 1

## 2019-07-05 MED ORDER — FLEET ENEMA 7-19 GM/118ML RE ENEM: 1.0000 | ENEMA | Freq: Once | RECTAL | Status: DC | PRN

## 2019-07-05 MED ORDER — DEXAMETHASONE SODIUM PHOSPHATE 10 MG/ML IJ SOLN
10.0000 mg | Freq: Once | INTRAMUSCULAR | Status: AC
  Administered 2019-07-06: 10 mg via INTRAVENOUS
  Filled 2019-07-05: qty 1

## 2019-07-05 MED ORDER — MIDAZOLAM HCL 5 MG/5ML IJ SOLN
INTRAMUSCULAR | Status: DC | PRN
  Administered 2019-07-05 (×2): 1 mg via INTRAVENOUS

## 2019-07-05 MED ORDER — GABAPENTIN 300 MG PO CAPS
300.0000 mg | ORAL_CAPSULE | Freq: Three times a day (TID) | ORAL | Status: DC
  Administered 2019-07-05 – 2019-07-06 (×3): 300 mg via ORAL
  Filled 2019-07-05 (×3): qty 1

## 2019-07-05 MED ORDER — BUPIVACAINE LIPOSOME 1.3 % IJ SUSP
INTRAMUSCULAR | Status: DC | PRN
  Administered 2019-07-05: 20 mL

## 2019-07-05 MED ORDER — OXYCODONE-ACETAMINOPHEN 5-325 MG PO TABS: 1.0000 | ORAL_TABLET | ORAL | 0 refills | Status: DC | PRN

## 2019-07-05 MED ORDER — NITROGLYCERIN 0.4 MG SL SUBL: 0.4000 mg | SUBLINGUAL_TABLET | SUBLINGUAL | Status: DC | PRN

## 2019-07-05 MED ORDER — SERTRALINE HCL 50 MG PO TABS
50.0000 mg | ORAL_TABLET | Freq: Every day | ORAL | Status: DC
  Administered 2019-07-05 – 2019-07-06 (×2): 50 mg via ORAL
  Filled 2019-07-05 (×2): qty 1

## 2019-07-05 MED ORDER — BUPIVACAINE HCL (PF) 0.25 % IJ SOLN
INTRAMUSCULAR | Status: AC
  Filled 2019-07-05: qty 30

## 2019-07-05 MED ORDER — ACETAMINOPHEN 325 MG PO TABS
325.0000 mg | ORAL_TABLET | Freq: Four times a day (QID) | ORAL | Status: DC | PRN
  Administered 2019-07-06: 650 mg via ORAL
  Filled 2019-07-05 (×2): qty 2

## 2019-07-05 MED ORDER — ONDANSETRON HCL 4 MG/2ML IJ SOLN
INTRAMUSCULAR | Status: DC | PRN
  Administered 2019-07-05: 4 mg via INTRAVENOUS

## 2019-07-05 MED ORDER — ADULT MULTIVITAMIN W/MINERALS CH
1.0000 | ORAL_TABLET | Freq: Every day | ORAL | Status: DC
  Administered 2019-07-05 – 2019-07-06 (×2): 1 via ORAL
  Filled 2019-07-05 (×2): qty 1

## 2019-07-05 MED ORDER — FENTANYL CITRATE (PF) 100 MCG/2ML IJ SOLN
INTRAMUSCULAR | Status: DC | PRN
  Administered 2019-07-05 (×2): 50 ug via INTRAVENOUS

## 2019-07-05 MED ORDER — ATORVASTATIN CALCIUM 40 MG PO TABS
40.0000 mg | ORAL_TABLET | Freq: Every day | ORAL | Status: DC
  Administered 2019-07-05: 40 mg via ORAL
  Filled 2019-07-05: qty 1

## 2019-07-05 MED ORDER — HYDROMORPHONE HCL 1 MG/ML IJ SOLN: 0.2500 mg | INTRAMUSCULAR | Status: DC | PRN

## 2019-07-05 MED ORDER — MENTHOL 3 MG MT LOZG: 1.0000 | LOZENGE | OROMUCOSAL | Status: DC | PRN

## 2019-07-05 MED ORDER — PHENOL 1.4 % MT LIQD
1.0000 | OROMUCOSAL | Status: DC | PRN
  Filled 2019-07-05: qty 177

## 2019-07-05 MED ORDER — DIPHENHYDRAMINE HCL 12.5 MG/5ML PO ELIX: 12.5000 mg | ORAL_SOLUTION | ORAL | Status: DC | PRN

## 2019-07-05 MED ORDER — TRANEXAMIC ACID-NACL 1000-0.7 MG/100ML-% IV SOLN
1000.0000 mg | INTRAVENOUS | Status: AC
  Administered 2019-07-05: 1000 mg via INTRAVENOUS
  Filled 2019-07-05: qty 100

## 2019-07-05 MED ORDER — PHENYLEPHRINE HCL (PRESSORS) 10 MG/ML IV SOLN
INTRAVENOUS | Status: AC
  Filled 2019-07-05: qty 1

## 2019-07-05 MED ORDER — METOCLOPRAMIDE HCL 5 MG/ML IJ SOLN: 5.0000 mg | Freq: Three times a day (TID) | INTRAMUSCULAR | Status: DC | PRN

## 2019-07-05 MED ORDER — ALUM & MAG HYDROXIDE-SIMETH 200-200-20 MG/5ML PO SUSP: 30.0000 mL | ORAL | Status: DC | PRN

## 2019-07-05 MED ORDER — LACTATED RINGERS IV SOLN
INTRAVENOUS | Status: DC
  Administered 2019-07-05 (×2): via INTRAVENOUS

## 2019-07-05 MED ORDER — METHOCARBAMOL 500 MG PO TABS
500.0000 mg | ORAL_TABLET | Freq: Four times a day (QID) | ORAL | Status: DC | PRN
  Administered 2019-07-05 – 2019-07-06 (×2): 500 mg via ORAL
  Filled 2019-07-05 (×2): qty 1

## 2019-07-05 MED ORDER — ONDANSETRON HCL 4 MG/2ML IJ SOLN: 4.0000 mg | Freq: Four times a day (QID) | INTRAMUSCULAR | Status: DC | PRN

## 2019-07-05 MED ORDER — OXYCODONE HCL 5 MG PO TABS
5.0000 mg | ORAL_TABLET | ORAL | Status: DC | PRN
  Administered 2019-07-05 – 2019-07-06 (×6): 10 mg via ORAL
  Filled 2019-07-05: qty 2
  Filled 2019-07-05: qty 1
  Filled 2019-07-05 (×5): qty 2

## 2019-07-05 MED ORDER — POLYETHYLENE GLYCOL 3350 17 G PO PACK: 17.0000 g | PACK | Freq: Every day | ORAL | Status: DC | PRN

## 2019-07-05 MED ORDER — EPHEDRINE SULFATE-NACL 50-0.9 MG/10ML-% IV SOSY
PREFILLED_SYRINGE | INTRAVENOUS | Status: DC | PRN
  Administered 2019-07-05 (×2): 10 mg via INTRAVENOUS

## 2019-07-05 MED ORDER — EPHEDRINE 5 MG/ML INJ
INTRAVENOUS | Status: AC
  Filled 2019-07-05: qty 10

## 2019-07-05 MED ORDER — VANCOMYCIN HCL IN DEXTROSE 1-5 GM/200ML-% IV SOLN
1000.0000 mg | INTRAVENOUS | Status: AC
  Administered 2019-07-05: 1000 mg via INTRAVENOUS
  Filled 2019-07-05: qty 200

## 2019-07-05 SURGICAL SUPPLY — 56 items
ATTUNE PS FEM RT SZ 5 CEM KNEE (Femur) ×1 IMPLANT
ATTUNE PSRP INSR SZ5 5 KNEE (Insert) ×1 IMPLANT
BAG DECANTER FOR FLEXI CONT (MISCELLANEOUS) ×2 IMPLANT
BAG SPEC THK2 15X12 ZIP CLS (MISCELLANEOUS) ×1
BAG ZIPLOCK 12X15 (MISCELLANEOUS) ×2 IMPLANT
BASE TIBIA ATTUNE KNEE SYS SZ6 (Knees) IMPLANT
BLADE SAG 18X100X1.27 (BLADE) ×2 IMPLANT
BLADE SAW SGTL 11.0X1.19X90.0M (BLADE) ×2 IMPLANT
BLADE SURG SZ10 CARB STEEL (BLADE) ×4 IMPLANT
BNDG CMPR MED 10X6 ELC LF (GAUZE/BANDAGES/DRESSINGS) ×1
BNDG CMPR MED 15X6 ELC VLCR LF (GAUZE/BANDAGES/DRESSINGS) ×1
BNDG ELASTIC 6X10 VLCR STRL LF (GAUZE/BANDAGES/DRESSINGS) ×2 IMPLANT
BNDG ELASTIC 6X15 VLCR STRL LF (GAUZE/BANDAGES/DRESSINGS) ×1 IMPLANT
BOWL SMART MIX CTS (DISPOSABLE) ×2 IMPLANT
BSPLAT TIB 6 CMNT ROT PLAT STR (Knees) ×1 IMPLANT
CEMENT HV SMART SET (Cement) ×4 IMPLANT
COVER SURGICAL LIGHT HANDLE (MISCELLANEOUS) ×2 IMPLANT
COVER WAND RF STERILE (DRAPES) ×1 IMPLANT
CUFF TOURN SGL QUICK 34 (TOURNIQUET CUFF) ×2
CUFF TRNQT CYL 34X4.125X (TOURNIQUET CUFF) ×1 IMPLANT
DECANTER SPIKE VIAL GLASS SM (MISCELLANEOUS) ×6 IMPLANT
DRAPE U-SHAPE 47X51 STRL (DRAPES) ×2 IMPLANT
DRESSING AQUACEL AG SP 3.5X10 (GAUZE/BANDAGES/DRESSINGS) IMPLANT
DRSG AQUACEL AG ADV 3.5X10 (GAUZE/BANDAGES/DRESSINGS) ×2 IMPLANT
DRSG AQUACEL AG SP 3.5X10 (GAUZE/BANDAGES/DRESSINGS) ×2
DURAPREP 26ML APPLICATOR (WOUND CARE) ×2 IMPLANT
ELECT REM PT RETURN 15FT ADLT (MISCELLANEOUS) ×2 IMPLANT
GLOVE BIO SURGEON STRL SZ7.5 (GLOVE) ×2 IMPLANT
GLOVE BIO SURGEON STRL SZ8.5 (GLOVE) ×2 IMPLANT
GLOVE BIOGEL PI IND STRL 8 (GLOVE) ×1 IMPLANT
GLOVE BIOGEL PI IND STRL 9 (GLOVE) ×1 IMPLANT
GLOVE BIOGEL PI INDICATOR 8 (GLOVE) ×1
GLOVE BIOGEL PI INDICATOR 9 (GLOVE) ×1
GOWN STRL REUS W/TWL XL LVL3 (GOWN DISPOSABLE) ×4 IMPLANT
HANDPIECE INTERPULSE COAX TIP (DISPOSABLE) ×2
HOOD PEEL AWAY FLYTE STAYCOOL (MISCELLANEOUS) ×6 IMPLANT
KIT TURNOVER KIT A (KITS) ×1 IMPLANT
NDL HYPO 21X1.5 SAFETY (NEEDLE) ×2 IMPLANT
NEEDLE HYPO 21X1.5 SAFETY (NEEDLE) ×4 IMPLANT
NS IRRIG 1000ML POUR BTL (IV SOLUTION) ×2 IMPLANT
PACK ICE MAXI GEL EZY WRAP (MISCELLANEOUS) ×3 IMPLANT
PACK TOTAL KNEE CUSTOM (KITS) ×2 IMPLANT
PATELLA MEDIAL ATTUN 35MM KNEE (Knees) ×1 IMPLANT
PIN STEINMAN FIXATION KNEE (PIN) ×1 IMPLANT
PROTECTOR NERVE ULNAR (MISCELLANEOUS) ×2 IMPLANT
SET HNDPC FAN SPRY TIP SCT (DISPOSABLE) ×1 IMPLANT
SUT VIC AB 1 CTX 36 (SUTURE) ×2
SUT VIC AB 1 CTX36XBRD ANBCTR (SUTURE) ×1 IMPLANT
SUT VIC AB 3-0 CT1 27 (SUTURE) ×6
SUT VIC AB 3-0 CT1 TAPERPNT 27 (SUTURE) ×3 IMPLANT
SYR CONTROL 10ML LL (SYRINGE) ×4 IMPLANT
TIBIA ATTUNE KNEE SYS BASE SZ6 (Knees) ×2 IMPLANT
TRAY FOLEY MTR SLVR 16FR STAT (SET/KITS/TRAYS/PACK) ×2 IMPLANT
WATER STERILE IRR 1000ML POUR (IV SOLUTION) ×4 IMPLANT
WRAP KNEE MAXI GEL POST OP (GAUZE/BANDAGES/DRESSINGS) ×1 IMPLANT
YANKAUER SUCT BULB TIP 10FT TU (MISCELLANEOUS) ×2 IMPLANT

## 2019-07-05 NOTE — Anesthesia Postprocedure Evaluation (Signed)
Anesthesia Post Note  Patient: Cynthia [name]  Procedure(s) Performed: RIGHT TOTAL KNEE ARTHROPLASTY (Right Knee)     Patient location during evaluation: PACU Anesthesia Type: Spinal Level of consciousness: oriented and awake and alert Pain management: pain level controlled Vital Signs Assessment: post-procedure vital signs reviewed and stable Respiratory status: spontaneous breathing, respiratory function stable and patient connected to nasal cannula oxygen Cardiovascular status: blood pressure returned to baseline and stable Postop Assessment: no headache, no backache and no apparent nausea or vomiting Anesthetic complications: no    Last Vitals:  Vitals:   07/05/19 1000 07/05/19 1015  BP: 105/63 114/69  Pulse: 65 65  Resp: 12 16  Temp:    SpO2: 100% 100%    Last Pain:  Vitals:   07/05/19 1015  PainSc: 0-No pain                 Rodell Marrs S

## 2019-07-05 NOTE — Anesthesia Procedure Notes (Addendum)
Spinal  Patient location during procedure: OR Start time: 07/05/2019 7:20 AM End time: 07/05/2019 7:24 AM Staffing Resident/CRNA: Lollie Sails, CRNA Performed: resident/CRNA  Preanesthetic Checklist Completed: patient identified, site marked, surgical consent, pre-op evaluation, timeout performed, IV checked, risks and benefits discussed and monitors and equipment checked Spinal Block Patient position: sitting Prep: site prepped and draped and DuraPrep Patient monitoring: heart rate, continuous pulse ox, blood pressure and cardiac monitor Approach: midline Location: L3-4 Injection technique: single-shot Needle Needle type: Sprotte  Needle gauge: 24 G Needle length: 10 cm Additional Notes Expiration date on kit noted and within range.  Good CSF flow noted with no heme or c/o paresthesia.   Patient tolerated well.

## 2019-07-05 NOTE — Transfer of Care (Signed)
Immediate Anesthesia Transfer of Care Note  Patient: Cynthia [name]  Procedure(s) Performed: RIGHT TOTAL KNEE ARTHROPLASTY (Right Knee)  Patient Location: PACU  Anesthesia Type:Spinal and MAC combined with regional for post-op pain  Level of Consciousness: awake, alert , oriented and patient cooperative  Airway & Oxygen Therapy: Patient Spontanous Breathing and Patient connected to face mask oxygen  Post-op Assessment: Report given to RN and Post -op Vital signs reviewed and stable  Post vital signs: Reviewed and stable  Last Vitals:  Vitals Value Taken Time  BP 96/60 07/05/19 0932  Temp    Pulse 75 07/05/19 0934  Resp 14 07/05/19 0934  SpO2 98 % 07/05/19 0934  Vitals shown include unvalidated device data.  Last Pain:  Vitals:   07/05/19 0628  PainSc: 0-No pain      Patients Stated Pain Goal: 4 (61/51/83 4373)  Complications: No apparent anesthesia complications

## 2019-07-05 NOTE — Interval H&P Note (Signed)
History and Physical Interval Note:  07/05/2019 7:02 AM  Cynthia [name]  has presented today for surgery, with the diagnosis of RIGHT KNEE OSTEOARTHRITIS VALGUS.  The various methods of treatment have been discussed with the patient and family. After consideration of risks, benefits and other options for treatment, the patient has consented to  Procedure(s): RIGHT TOTAL KNEE ARTHROPLASTY (Right) as a surgical intervention.  The patient's history has been reviewed, patient examined, no change in status, stable for surgery.  I have reviewed the patient's chart and labs.  Questions were answered to the patient's satisfaction.     Kerin Salen

## 2019-07-05 NOTE — Anesthesia Procedure Notes (Signed)
Anesthesia Procedure Image    

## 2019-07-05 NOTE — Anesthesia Preprocedure Evaluation (Addendum)
Anesthesia Evaluation  Patient identified by MRN, date of birth, ID band Patient awake    Reviewed: Allergy & Precautions, NPO status , Patient's Chart, lab work & pertinent test results  Airway Mallampati: II  TM Distance: >3 FB Neck ROM: Full    Dental no notable dental hx.    Pulmonary neg pulmonary ROS,    Pulmonary exam normal breath sounds clear to auscultation       Cardiovascular hypertension, + CAD  + Valvular Problems/Murmurs AS  Rhythm:Regular Rate:Normal + Systolic murmurs    Neuro/Psych negative neurological ROS  negative psych ROS   GI/Hepatic negative GI ROS, Neg liver ROS,   Endo/Other  negative endocrine ROS  Renal/GU negative Renal ROS  negative genitourinary   Musculoskeletal negative musculoskeletal ROS (+)   Abdominal   Peds negative pediatric ROS (+)  Hematology negative hematology ROS (+)   Anesthesia Other Findings   Reproductive/Obstetrics negative OB ROS                            Anesthesia Physical Anesthesia Plan  ASA: II  Anesthesia Plan: Spinal   Post-op Pain Management:  Regional for Post-op pain   Induction: Intravenous  PONV Risk Score and Plan: 2 and Ondansetron and Dexamethasone  Airway Management Planned: Simple Face Mask  Additional Equipment:   Intra-op Plan:   Post-operative Plan:   Informed Consent: I have reviewed the patients History and Physical, chart, labs and discussed the procedure including the risks, benefits and alternatives for the proposed anesthesia with the patient or authorized representative who has indicated his/her understanding and acceptance.     Dental advisory given  Plan Discussed with: CRNA and Surgeon  Anesthesia Plan Comments:        Anesthesia Quick Evaluation

## 2019-07-05 NOTE — Op Note (Signed)
PATIENT ID:      Cynthia [name]  MRN:     [id number] DOB/AGE:    68-Apr-1952 / 68 y.o.       OPERATIVE REPORT   DATE OF PROCEDURE:  07/05/2019      PREOPERATIVE DIAGNOSIS:   RIGHT KNEE OSTEOARTHRITIS VALGUS      Estimated body mass index is 39.2 kg/m as calculated from the following:   Height as of this encounter: 5\' 5"  (1.651 m).   Weight as of this encounter: 106.8 kg.                                                       POSTOPERATIVE DIAGNOSIS:   *Same                                                                       PROCEDURE:  Procedure(s): RIGHT TOTAL KNEE ARTHROPLASTY Using DepuyAttune RP implants #5R Femur, #6Tibia, 5 mm Attune RP bearing, 35 Patella    SURGEON: Kerin Salen  ASSISTANT:   Kerry Hough. [employer]   (Present and scrubbed throughout the case, critical for assistance with exposure, retraction, instrumentation, and closure.)        ANESTHESIA: Spinal, 20cc Exparel, 50cc 0.25% Marcaine EBL: 300 cc FLUID REPLACEMENT: 1600 cc crystaloid TOURNIQUET: DRAINS: None TRANEXAMIC ACID: 1gm IV, 2gm topical COMPLICATIONS:  None         INDICATIONS FOR PROCEDURE: The patient has  RIGHT KNEE OSTEOARTHRITIS VALGUS, deformities, XR shows bone on bone arthritis, lateral subluxation of tibia. Patient has failed all conservative measures including anti-inflammatory medicines, narcotics, attempts at exercise and weight loss, cortisone injections and viscosupplementation.  Risks and benefits of surgery have been discussed, questions answered.   DESCRIPTION OF PROCEDURE: The patient identified by armband, received  IV antibiotics, in the holding area at [facility]. Patient taken to the operating room, appropriate anesthetic monitors were attached, and spinal anesthesia was  induced. IV Tranexamic acid was given.Tourniquet applied high to the operative thigh. Lateral post and foot positioner applied to the table, the lower extremity was then prepped and draped in usual sterile  fashion from the toes to the tourniquet. Time-out procedure was performed. The skin and subcutaneous tissue along the incision was injected with 20 cc of a mixture of Exparel and Marcaine solution, using a 20-gauge by 1-1/2 inch needle. We began the operation, with the knee flexed 130 degrees, by making the anterior midline incision starting at handbreadth above the patella going over the patella 1 cm medial to and 4 cm distal to the tibial tubercle. Small bleeders in the skin and the subcutaneous tissue identified and cauterized. Transverse retinaculum was incised and reflected medially and a medial parapatellar arthrotomy was accomplished. the patella was everted and theprepatellar fat pad resected. The superficial medial collateral ligament was then elevated from anterior to posterior along the proximal flare of the tibia and anterior half of the menisci resected. The knee was hyperflexed exposing bone on bone arthritis. Peripheral and notch osteophytes as well as the cruciate ligaments were then resected. We continued  to work our way around posteriorly along the proximal tibia, and externally rotated the tibia subluxing it out from underneath the femur. A McHale PCL retractor was placed through the notch and a lateral Hohmann retractor placed, and we then entered the proximal tibia in line with the Depuy starter drill in line with the axis of the tibia followed by an intramedullary guide rod and 0-degree posterior slope cutting guide. The tibial cutting guide, 4 degree posterior sloped, was pinned into place allowing resection of 6 mm of bone medially and 1 mm of bone laterally. Satisfied with the tibial resection, we then entered the distal femur 2 mm anterior to the PCL origin with the intramedullary guide rod and applied the distal femoral cutting guide set at 9 mm, with 5 degrees of valgus. This was pinned along the epicondylar axis. At this point, the distal femoral cut was accomplished without difficulty.  We then sized for a #5R femoral component and pinned the guide in 0 degrees of external rotation. The chamfer cutting guide was pinned into place. The anterior, posterior, and chamfer cuts were accomplished without difficulty followed by the Attune RP box cutting guide and the box cut. We also removed posterior osteophytes from the posterior femoral condyles. The posterior capsule was injected with Exparel solution. The knee was brought into full extension. We checked our extension gap and fit a 5 mm bearing. Distracting in extension with a lamina spreader,  bleeders in the posterior capsule, Posterior medial and posterior lateral gutter were cauterized.  The transexamic acid-soaked sponge was then placed in the gap of the knee in extension. The knee was flexed 30. The posterior patella cut was accomplished with the 9.5 mm Attune cutting guide, sized for a 7mm dome, and the fixation pegs drilled.The knee was then once again hyperflexed exposing the proximal tibia. We sized for a # 6 tibial base plate, applied the smokestack and the conical reamer followed by the the Delta fin keel punch. We then hammered into place the Attune RP trial femoral component, drilled the lugs, inserted a  5 mm trial bearing, trial patellar button, and took the knee through range of motion from 0-130 degrees. Medial and lateral ligamentous stability was checked. No thumb pressure was required for patellar Tracking. The tourniquet was not used. All trial components were removed, mating surfaces irrigated with pulse lavage, and dried with suction and sponges. 10 cc of the Exparel solution was applied to the cancellus bone of the patella distal femur and proximal tibia.  After waiting 30 seconds, the bony surfaces were again, dried with sponges. A double batch of DePuy HV cement was mixed and applied to all bony metallic mating surfaces except for the posterior condyles of the femur itself. In order, we hammered into place the tibial tray  and removed excess cement, the femoral component and removed excess cement. The final Attune RP bearing was inserted, and the knee brought to full extension with compression. The patellar button was clamped into place, and excess cement removed. The knee was held at 30 flexion with compression, while the cement cured. The wound was irrigated out with normal saline solution pulse lavage. The rest of the Exparel was injected into the parapatellar arthrotomy, subcutaneous tissues, and periosteal tissues. The parapatellar arthrotomy was closed with running #1 Vicryl suture. The subcutaneous tissue with 0 and 2-0 undyed Vicryl suture, and the skin with running 3-0 SQ vicryl. An Aquacil and Ace wrap were applied. The patient was taken to recovery room without  difficulty.   Kerin Salen 07/05/2019, 7:28 AM

## 2019-07-05 NOTE — Anesthesia Procedure Notes (Signed)
Procedure Name: MAC Date/Time: 07/05/2019 7:15 AM Performed by: Lollie Sails, CRNA Pre-anesthesia Checklist: Patient identified, Emergency Drugs available, Suction available and Patient being monitored Oxygen Delivery Method: Simple face mask

## 2019-07-05 NOTE — Discharge Instructions (Signed)

## 2019-07-05 NOTE — Evaluation (Signed)
Physical Therapy Evaluation Patient Details Name: Cynthia [name] MRN: [id number] DOB: 20-Jul-1951 Today's Date: 07/05/2019   History of Present Illness  R TKA  Clinical Impression  Pt is s/p TKA resulting in the deficits listed below (see PT Problem List). Pt ambulated 64' with RW, no loss of balance. Initiated TKA HEP. Good progress expected, pt is motivated and puts forth good effort.  Pt will benefit from skilled PT to increase their independence and safety with mobility to allow discharge to the venue listed below.      Follow Up Recommendations Follow surgeon's recommendation for DC plan and follow-up therapies;Supervision for mobility/OOB    Equipment Recommendations  Rolling walker with 5" wheels;3in1 (PT)    Recommendations for Other Services       Precautions / Restrictions Precautions Precautions: Fall;Knee Precaution Booklet Issued: Yes (comment) Precaution Comments: instructed pt in no pillow under knee Restrictions Weight Bearing Restrictions: No Other Position/Activity Restrictions: WBAT      Mobility  Bed Mobility Overal bed mobility: Needs Assistance Bed Mobility: Sit to Supine;Supine to Sit     Supine to sit: Min assist     General bed mobility comments: min A for RLE OOB  Transfers Overall transfer level: Needs assistance Equipment used: Rolling walker (2 wheeled) Transfers: Sit to/from Stand Sit to Stand: Min assist;From elevated surface         General transfer comment: VCs hand placement  Ambulation/Gait Ambulation/Gait assistance: Min guard Gait Distance (Feet): 60 Feet Assistive device: Rolling walker (2 wheeled) Gait Pattern/deviations: Step-to pattern;Trunk flexed;Decreased stride length Gait velocity: decr   General Gait Details: VCs sequencing, positioning in RW, and posture; no LOB  Stairs            Wheelchair Mobility    Modified Rankin (Stroke Patients Only)       Balance Overall balance assessment: Modified  Independent                                           Pertinent Vitals/Pain Pain Assessment: 0-10 Pain Score: 5  Pain Location: R knee with walking Pain Descriptors / Indicators: Sore Pain Intervention(s): Limited activity within patient's tolerance;Monitored during session;Premedicated before session;Ice applied    Home Living Family/patient expects to be discharged to:: Private residence Living Arrangements: Spouse/significant other Available Help at Discharge: Available 24 hours/day   Home Access: Stairs to enter Entrance Stairs-Rails: Left Entrance Stairs-Number of Steps: 4 Home Layout: One level Home Equipment: Cane - single point      Prior Function Level of Independence: Independent with assistive device(s)         Comments: used SPC prn     Hand Dominance        Extremity/Trunk Assessment   Upper Extremity Assessment Upper Extremity Assessment: Overall WFL for tasks assessed    Lower Extremity Assessment Lower Extremity Assessment: RLE deficits/detail RLE Deficits / Details: SLR -3/5, knee AAROM 5-45* RLE Sensation: WNL RLE Coordination: WNL    Cervical / Trunk Assessment Cervical / Trunk Assessment: Normal  Communication   Communication: No difficulties  Cognition Arousal/Alertness: Awake/alert Behavior During Therapy: WFL for tasks assessed/performed Overall Cognitive Status: Within Functional Limits for tasks assessed  General Comments      Exercises Total Joint Exercises Ankle Circles/Pumps: AROM;Both;10 reps;Supine Quad Sets: AROM;Right;5 reps;Supine Long Arc Quad: AAROM;Right;5 reps;Seated Goniometric ROM: 5-45* R knee   Assessment/Plan    PT Assessment    PT Problem List         PT Treatment Interventions      PT Goals (Current goals can be found in the Care Plan section)  Acute Rehab PT Goals Patient Stated Goal: to go out for a good walk PT Goal  Formulation: With patient Time For Goal Achievement: 07/12/19 Potential to Achieve Goals: Good    Frequency 7X/week   Barriers to discharge        Co-evaluation               AM-PAC PT "6 Clicks" Mobility  Outcome Measure Help needed turning from your back to your side while in a flat bed without using bedrails?: A Little Help needed moving from lying on your back to sitting on the side of a flat bed without using bedrails?: A Little Help needed moving to and from a bed to a chair (including a wheelchair)?: A Little Help needed standing up from a chair using your arms (e.g., wheelchair or bedside chair)?: A Little Help needed to walk in hospital room?: A Little Help needed climbing 3-5 steps with a railing? : A Lot 6 Click Score: 17    End of Session Equipment Utilized During Treatment: Gait belt Activity Tolerance: Patient tolerated treatment well Patient left: in chair;with call bell/phone within reach;with chair alarm set Nurse Communication: Mobility status PT Visit Diagnosis: Difficulty in walking, not elsewhere classified (R26.2);Pain Pain - Right/Left: Right Pain - part of body: Knee    Time: [id number] PT Time Calculation (min) (ACUTE ONLY): 35 min   Charges:   PT Evaluation $PT Eval Low Complexity: 1 Low PT Treatments $Gait Training: 8-22 mins       Blondell Reveal Kistler PT 07/05/2019  Acute Rehabilitation Services Pager [phone] Office [phone]

## 2019-07-05 NOTE — Anesthesia Procedure Notes (Signed)
Anesthesia Regional Block: Adductor canal block   Pre-Anesthetic Checklist: ,, timeout performed, Correct Patient, Correct Site, Correct Laterality, Correct Procedure, Correct Position, site marked, Risks and benefits discussed,  Surgical consent,  Pre-op evaluation,  At surgeon's request and post-op pain management  Laterality: Right  Prep: chloraprep       Needles:  Injection technique: Single-shot  Needle Type: Echogenic Needle     Needle Length: 9cm      Additional Needles:   Procedures:,,,, ultrasound used (permanent image in chart),,,,  Narrative:  Start time: 07/05/2019 6:59 AM End time: 07/05/2019 7:06 AM Injection made incrementally with aspirations every 5 mL.  Performed by: Personally  Anesthesiologist: Myrtie Soman, MD  Additional Notes: Patient tolerated the procedure well without complications

## 2019-07-06 ENCOUNTER — Encounter (HOSPITAL_COMMUNITY): Payer: Self-pay | Admitting: Orthopedic Surgery

## 2019-07-06 DIAGNOSIS — I10 Essential (primary) hypertension: Secondary | ICD-10-CM | POA: Diagnosis not present

## 2019-07-06 DIAGNOSIS — Z85828 Personal history of other malignant neoplasm of skin: Secondary | ICD-10-CM | POA: Diagnosis not present

## 2019-07-06 DIAGNOSIS — F329 Major depressive disorder, single episode, unspecified: Secondary | ICD-10-CM | POA: Diagnosis not present

## 2019-07-06 DIAGNOSIS — I471 Supraventricular tachycardia: Secondary | ICD-10-CM | POA: Diagnosis not present

## 2019-07-06 DIAGNOSIS — Z6839 Body mass index (BMI) 39.0-39.9, adult: Secondary | ICD-10-CM | POA: Diagnosis not present

## 2019-07-06 DIAGNOSIS — F419 Anxiety disorder, unspecified: Secondary | ICD-10-CM | POA: Diagnosis not present

## 2019-07-06 DIAGNOSIS — Z96652 Presence of left artificial knee joint: Secondary | ICD-10-CM | POA: Diagnosis not present

## 2019-07-06 DIAGNOSIS — D62 Acute posthemorrhagic anemia: Secondary | ICD-10-CM | POA: Diagnosis not present

## 2019-07-06 DIAGNOSIS — Z79899 Other long term (current) drug therapy: Secondary | ICD-10-CM | POA: Diagnosis not present

## 2019-07-06 DIAGNOSIS — E669 Obesity, unspecified: Secondary | ICD-10-CM | POA: Diagnosis not present

## 2019-07-06 DIAGNOSIS — I2510 Atherosclerotic heart disease of native coronary artery without angina pectoris: Secondary | ICD-10-CM | POA: Diagnosis not present

## 2019-07-06 DIAGNOSIS — M21061 Valgus deformity, not elsewhere classified, right knee: Secondary | ICD-10-CM | POA: Diagnosis not present

## 2019-07-06 DIAGNOSIS — M1711 Unilateral primary osteoarthritis, right knee: Secondary | ICD-10-CM | POA: Diagnosis not present

## 2019-07-06 DIAGNOSIS — Z7982 Long term (current) use of aspirin: Secondary | ICD-10-CM | POA: Diagnosis not present

## 2019-07-06 DIAGNOSIS — E119 Type 2 diabetes mellitus without complications: Secondary | ICD-10-CM | POA: Diagnosis not present

## 2019-07-06 DIAGNOSIS — Z880 Allergy status to penicillin: Secondary | ICD-10-CM | POA: Diagnosis not present

## 2019-07-06 DIAGNOSIS — D649 Anemia, unspecified: Secondary | ICD-10-CM | POA: Diagnosis not present

## 2019-07-06 DIAGNOSIS — M1712 Unilateral primary osteoarthritis, left knee: Secondary | ICD-10-CM | POA: Diagnosis not present

## 2019-07-06 LAB — BASIC METABOLIC PANEL
Anion gap: 7 (ref 5–15)
BUN: 15 mg/dL (ref 8–23)
CO2: 24 mmol/L (ref 22–32)
Calcium: 8.2 mg/dL — ABNORMAL LOW (ref 8.9–10.3)
Chloride: 106 mmol/L (ref 98–111)
Creatinine, Ser: 0.8 mg/dL (ref 0.44–1.00)
GFR calc Af Amer: >60 mL/min (ref >60)
GFR calc non Af Amer: >60 mL/min (ref >60)
Glucose, Bld: 200 mg/dL — ABNORMAL HIGH (ref 70–99)
Potassium: 4.3 mmol/L (ref 3.5–5.1)
Sodium: 137 mmol/L (ref 135–145)

## 2019-07-06 LAB — CBC
HCT: 32.1 % — ABNORMAL LOW (ref 36.0–46.0)
Hemoglobin: 10.1 g/dL — ABNORMAL LOW (ref 12.0–15.0)
MCH: 27.3 pg (ref 26.0–34.0)
MCHC: 31.5 g/dL (ref 30.0–36.0)
MCV: 86.8 fL (ref 80.0–100.0)
Platelets: 239 10*3/uL (ref 150–400)
RBC: 3.7 MIL/uL — ABNORMAL LOW (ref 3.87–5.11)
RDW: 14.9 % (ref 11.5–15.5)
WBC: 13 10*3/uL — ABNORMAL HIGH (ref 4.0–10.5)
nRBC: 0 % (ref 0.0–0.2)

## 2019-07-06 MED FILL — OXYCODONE-ACETAMINOPHEN 5-3: 5-325 | 5 days supply | Qty: 30 | Fill #0

## 2019-07-06 MED FILL — tiZANidine HCL 2 MG TABS: 2 | 15 days supply | Qty: 60 | Fill #0

## 2019-07-06 MED FILL — ASPIRIN 81 MG TBEC: 81 | 30 days supply | Qty: 60 | Fill #0

## 2019-07-06 NOTE — Progress Notes (Signed)
PATIENT ID: Cynthia [name]  MRN: [id number]  DOB/AGE:  1950-10-21 / 68 y.o.  1 Day Post-Op Procedure(s) (LRB): RIGHT TOTAL KNEE ARTHROPLASTY (Right)    PROGRESS NOTE Subjective: Patient is alert, oriented, no Nausea, no Vomiting, yes passing gas. Taking PO well. Denies SOB, Chest or Calf Pain. Using Incentive Spirometer, PAS in place. Ambulate 60', Patient reports pain as 2/10 .    Objective: Vital signs in last 24 hours: Vitals:   07/05/19 1746 07/05/19 2110 07/06/19 0133 07/06/19 0421  BP: 116/73 119/65 (Abnormal) 107/57 126/65  Pulse: 75 66 77 72  Resp: 16 18 14 14   Temp: 98 F (36.7 C) 97.9 F (36.6 C) 98 F (36.7 C) 98 F (36.7 C)  TempSrc: Oral Oral Oral Oral  SpO2: 98% 95% 96% 98%  Weight:      Height:          Intake/Output from previous day: I/O last 3 completed shifts: In: 5457.5 [P.O.:1620; I.V.:3337.5; Other:200; IV Piggyback:300] Out: 3050 [Urine:3025; Blood:25]   Intake/Output this shift: No intake/output data recorded.   LABORATORY DATA: Recent Labs    07/06/19 0312  WBC 13.0*  HGB 10.1*  HCT 32.1*  PLT 239  NA 137  K 4.3  CL 106  CO2 24  BUN 15  CREATININE 0.80  GLUCOSE 200*  CALCIUM 8.2*    Examination: Neurologically intact ABD soft Neurovascular intact Sensation intact distally Intact pulses distally Dorsiflexion/Plantar flexion intact Incision: dressing C/D/I No cellulitis present Compartment soft}  Assessment:   1 Day Post-Op Procedure(s) (LRB): RIGHT TOTAL KNEE ARTHROPLASTY (Right) ADDITIONAL DIAGNOSIS: Expected Acute Blood Loss Anemia, obesity, HTN  Patient's anticipated LOS is less than 2 midnights, meeting these requirements: - Younger than 17 - Lives within 1 hour of care - Has a competent adult at home to recover with post-op recover - NO history of  - Chronic pain requiring opiods  - Diabetes  - Coronary Artery Disease  - Heart failure  - Heart attack  - Stroke  - DVT/VTE  - Cardiac arrhythmia  - Respiratory  Failure/COPD  - Renal failure  - Anemia  - Advanced Liver disease       Plan: PT/OT WBAT, AROM and PROM  DVT Prophylaxis:  SCDx72hrs, ASA 81 mg BID x 2 weeks DISCHARGE PLAN: Home , today after PT  DISCHARGE NEEDS: HHPT, Walker and 3-in-1 comode seat     Kerin Salen 07/06/2019, 7:51 AM Patient ID: Cynthia [name], female   DOB: Aug 30, 1951, 68 y.o.   MRN: [id number]

## 2019-07-06 NOTE — TOC Progression Note (Signed)
Transition of Care [facility]) - Progression Note    Patient Details  Name: Cynthia [name] MRN: [id number] Date of Birth: [birth year]  [facility]) CM/SW Contact  Leeroy Cha, RN Phone Number: 07/06/2019, 8:47 AM  Clinical Narrative:    dcd to home with kah for hhc   Expected Discharge Plan: [place] Barriers to Discharge: No Barriers Identified  Expected Discharge Plan and Services Expected Discharge Plan: [place]   Discharge Planning Services: CM Consult Post Acute Care Choice: Home Health, Durable Medical Equipment Living arrangements for the past 2 months: Single Family Home Expected Discharge Date: 07/06/19               DME Arranged: Gilford Rile rolling, 3-N-1 DME Agency: [employer] Date DME Agency Contacted: 07/06/19 Time DME Agency Contacted: [phone] Representative spoke with at DME Agency: nathan HH Arranged: PT [place]: [facility] (formerly [employer]) Date [place]: 07/06/19 Time [place]: [phone] Representative spoke with at [place]: mike   Social Determinants of Health ([place]) Interventions    Readmission Risk Interventions No flowsheet data found.

## 2019-07-06 NOTE — Plan of Care (Signed)

## 2019-07-06 NOTE — Progress Notes (Signed)
Physical Therapy Treatment Patient Details Name: Cynthia [name] MRN: [id number] DOB: 30-Nov-1950 Today's Date: 07/06/2019    History of Present Illness R TKA    PT Comments    Pt has met PT goals and is ready to DC home from PT standpoint. She ambulated 180' with RW, completed stair training, and demonstrates good understanding of HEP.   Follow Up Recommendations  Follow surgeon's recommendation for DC plan and follow-up therapies;Supervision for mobility/OOB     Equipment Recommendations  Rolling walker with 5" wheels;3in1 (PT)    Recommendations for Other Services       Precautions / Restrictions Precautions Precautions: Fall;Knee Precaution Booklet Issued: Yes (comment) Precaution Comments: instructed pt in no pillow under knee Restrictions Weight Bearing Restrictions: No Other Position/Activity Restrictions: WBAT    Mobility  Bed Mobility Overal bed mobility: Modified Independent Bed Mobility: Supine to Sit     Supine to sit: Modified independent (Device/Increase time)        Transfers Overall transfer level: Needs assistance Equipment used: Rolling walker (2 wheeled) Transfers: Sit to/from Stand Sit to Stand: From elevated surface;Supervision         General transfer comment: VCs hand placement  Ambulation/Gait Ambulation/Gait assistance: Min guard Gait Distance (Feet): 180 Feet Assistive device: Rolling walker (2 wheeled) Gait Pattern/deviations: Step-to pattern;Trunk flexed;Decreased stride length Gait velocity: decr   General Gait Details: no loss of balance   Stairs Stairs: Yes Stairs assistance: Min guard Stair Management: One rail Left;Step to pattern;Forwards;With cane Number of Stairs: 3 General stair comments: VCs sequencing   Wheelchair Mobility    Modified Rankin (Stroke Patients Only)       Balance Overall balance assessment: Modified Independent                                          Cognition  Arousal/Alertness: Awake/alert Behavior During Therapy: WFL for tasks assessed/performed Overall Cognitive Status: Within Functional Limits for tasks assessed                                        Exercises Total Joint Exercises Ankle Circles/Pumps: AROM;Both;10 reps;Supine Quad Sets: AROM;Right;Supine;10 reps Short Arc Quad: AROM;Right;10 reps;Supine Heel Slides: AAROM;Right;10 reps;Supine Hip ABduction/ADduction: AROM;Right;10 reps;Supine Straight Leg Raises: AROM;Right;5 reps;Supine Long Arc Quad: AAROM;Right;5 reps;Seated Knee Flexion: AAROM;Right;10 reps;Seated Goniometric ROM: 0-50* R knee AAROM    General Comments        Pertinent Vitals/Pain Pain Score: 3  Pain Location: R TKA Pain Descriptors / Indicators: Sore Pain Intervention(s): Limited activity within patient's tolerance;Monitored during session;Premedicated before session;Ice applied    Home Living                      Prior Function            PT Goals (current goals can now be found in the care plan section) Acute Rehab PT Goals Patient Stated Goal: to go out for a good walk PT Goal Formulation: With patient Time For Goal Achievement: 07/12/19 Potential to Achieve Goals: Good Progress towards PT goals: Goals met/education completed, patient discharged from PT    Frequency    7X/week      PT Plan Current plan remains appropriate    Co-evaluation  AM-PAC PT "6 Clicks" Mobility   Outcome Measure  Help needed turning from your back to your side while in a flat bed without using bedrails?: None Help needed moving from lying on your back to sitting on the side of a flat bed without using bedrails?: None Help needed moving to and from a bed to a chair (including a wheelchair)?: None Help needed standing up from a chair using your arms (e.g., wheelchair or bedside chair)?: None Help needed to walk in hospital room?: None Help needed climbing 3-5 steps  with a railing? : A Little 6 Click Score: 23    End of Session Equipment Utilized During Treatment: Gait belt Activity Tolerance: Patient tolerated treatment well Patient left: in chair;with call bell/phone within reach Nurse Communication: Mobility status PT Visit Diagnosis: Difficulty in walking, not elsewhere classified (R26.2);Pain Pain - Right/Left: Right Pain - part of body: Knee     Time: 1655-3748 PT Time Calculation (min) (ACUTE ONLY): 35 min  Charges:  $Gait Training: 8-22 mins $Therapeutic Exercise: 8-22 mins                     Blondell Reveal Kistler PT 07/06/2019  Acute Rehabilitation Services Pager [phone] Office [phone]

## 2019-07-06 NOTE — Discharge Summary (Signed)
Patient ID: Cynthia [name] MRN: [id number] DOB/AGE: December 28, 1950 68 y.o.  Admit date: 07/05/2019 Discharge date: 07/06/2019  Admission Diagnoses:  Principal Problem:   Osteoarthritis of right knee Active Problems:   S/P total knee replacement using cement, left   Discharge Diagnoses:  Same  Past Medical History:  Diagnosis Date  . Anemia   . Anxiety disorder   . Aortic valve sclerosis 05/24/2019   Moderate noted on ECHO  . Arthritis    right knee  . Cancer of the skin, basal cell   . Coronary artery disease    Moderate to severe, non-critical single-vessel coronary artery disease with 60-70% proximal LAD stenosis at origin of large diagonal branch ([place] 1,0,0).  . Demand ischemia ([place]) 05/24/2019  . Depression   . GERD (gastroesophageal reflux disease)    TUMS  . Hypertension   . Lateral meniscus derangement, right   . SVT (supraventricular tachycardia) (HCC)     Surgeries: Procedure(s): RIGHT TOTAL KNEE ARTHROPLASTY on 07/05/2019   Consultants:   Discharged Condition: Improved  Hospital Course: Cynthia [name] is an 68 y.o. female who was admitted 07/05/2019 for operative treatment ofOsteoarthritis of right knee. Patient has severe unremitting pain that affects sleep, daily activities, and work/hobbies. After pre-op clearance the patient was taken to the operating room on 07/05/2019 and underwent  Procedure(s): RIGHT TOTAL KNEE ARTHROPLASTY.    Patient was given perioperative antibiotics:  Anti-infectives (From admission, onward)   Start     Dose/Rate Route Frequency Ordered Stop   07/05/19 0615  vancomycin (VANCOCIN) IVPB 1000 mg/200 mL premix     1,000 mg 200 mL/hr over 60 Minutes Intravenous On call to O.R. 07/05/19 0600 07/05/19 0745       Patient was given sequential compression devices, early ambulation, and chemoprophylaxis to prevent DVT.  Patient benefited maximally from hospital stay and there were no complications.    Recent vital signs:  Patient  Vitals for the past 24 hrs:  BP Temp Temp src Pulse Resp SpO2  07/06/19 0421 126/65 98 F (36.7 C) Oral 72 14 98 %  07/06/19 0133 (Abnormal) 107/57 98 F (36.7 C) Oral 77 14 96 %  07/05/19 2110 119/65 97.9 F (36.6 C) Oral 66 18 95 %  07/05/19 1746 116/73 98 F (36.7 C) Oral 75 16 98 %  07/05/19 1348 113/61 (Abnormal) 97.5 F (36.4 C) Oral 69 16 100 %  07/05/19 1250 123/76 no documentation no documentation 75 16 100 %  07/05/19 1154 112/69 no documentation no documentation 66 16 100 %  07/05/19 1044 122/69 (Abnormal) 97.5 F (36.4 C) Oral 60 18 100 %  07/05/19 1015 114/69 no documentation no documentation 65 16 100 %  07/05/19 1000 105/63 no documentation no documentation 65 12 100 %  07/05/19 0945 108/64 no documentation no documentation 67 12 98 %  07/05/19 0933 96/60 (Abnormal) 97.4 F (36.3 C) no documentation no documentation 13 96 %     Recent laboratory studies:  Recent Labs    07/06/19 0312  WBC 13.0*  HGB 10.1*  HCT 32.1*  PLT 239  NA 137  K 4.3  CL 106  CO2 24  BUN 15  CREATININE 0.80  GLUCOSE 200*  CALCIUM 8.2*     Discharge Medications:   Allergies as of 07/06/2019    Allergen Reactions Comment   Penicillins Rash Pt does not remember, states she was real young      Medication List    Take these medications   alendronate 70  MG tablet Commonly known as: FOSAMAX Take 70 mg by mouth once a week.   ALPRAZolam 0.25 MG tablet Commonly known as: XANAX Take 0.25 mg by mouth at bedtime as needed for anxiety.   aspirin EC 81 MG tablet Take 1 tablet (81 mg total) by mouth 2 (two) times daily. What changed: when to take this   atorvastatin 40 MG tablet Commonly known as: LIPITOR Take 1 tablet (40 mg total) by mouth daily at 6 PM.   buPROPion 300 MG 24 hr tablet Commonly known as: WELLBUTRIN XL Take 300 mg by mouth daily.   CALCIUM 500 PO Take 500 mg by mouth daily.   ferrous sulfate 325 (65 FE) MG tablet Take 325 mg by mouth daily with  breakfast.   metoprolol succinate 25 MG 24 hr tablet Commonly known as: TOPROL-XL Take 1 tablet (25 mg total) by mouth daily.   MULTIVITAMIN ADULT PO Take 1 tablet by mouth daily.   nitrofurantoin 100 MG capsule Commonly known as: MACRODANTIN Take 100 mg by mouth daily as needed (UTI).   nitroGLYCERIN 0.4 MG SL tablet Commonly known as: NITROSTAT Place 1 tablet (0.4 mg total) under the tongue every 5 (five) minutes x 3 doses as needed for chest pain.   oxyCODONE-acetaminophen 5-325 MG tablet Commonly known as: PERCOCET/ROXICET Take 1 tablet by mouth every 4 (four) hours as needed for severe pain.   sertraline 50 MG tablet Commonly known as: ZOLOFT Take 50 mg by mouth daily.   tiZANidine 2 MG tablet Commonly known as: ZANAFLEX Take 1 tablet (2 mg total) by mouth every 6 (six) hours as needed.        Durable Medical Equipment  (From admission, onward)         Start     Ordered   07/05/19 1047  DME Walker rolling  Once    Question:  Patient needs a walker to treat with the following condition  Answer:  Status post total left knee replacement   07/05/19 1046   07/05/19 1047  DME 3 n 1  Once     07/05/19 1046           Discharge Care Instructions  (From admission, onward)         Start     Ordered   07/06/19 0000  Change dressing    Comments: Change dressing Only if drainage exceeds 40% of window on dressing   07/06/19 0753          Diagnostic Studies: Dg Chest 2 View  Result Date: 07/05/2019 CLINICAL DATA:  Preoperative chest x-ray. EXAM: CHEST - 2 VIEW COMPARISON:  05/24/2019 FINDINGS: The heart size and mediastinal contours are within normal limits. Both lungs are clear. Degenerative changes present in the spine. IMPRESSION: No active cardiopulmonary disease. Electronically Signed   By: Zetta Bills M.D.   On: 07/05/2019 08:20    Disposition: Discharge disposition: 01-Home or Self Care       Discharge Instructions    Call MD / Call 911    Complete by: As directed    If you experience chest pain or shortness of breath, CALL 911 and be transported to the hospital emergency room.  If you develope a fever above 101 F, pus (white drainage) or increased drainage or redness at the wound, or calf pain, call your surgeon's office.   Change dressing   Complete by: As directed    Change dressing Only if drainage exceeds 40% of window on dressing   Constipation  Prevention   Complete by: As directed    Drink plenty of fluids.  Prune juice may be helpful.  You may use a stool softener, such as Colace (over the counter) 100 mg twice a day.  Use MiraLax (over the counter) for constipation as needed.   Diet - low sodium heart healthy   Complete by: As directed    Increase activity slowly as tolerated   Complete by: As directed       Follow-up Information    Frederik Pear, MD In 2 weeks.   Specialty: Orthopedic Surgery Contact information: [place] [address] [phone]            Signed: Kerin Salen 07/06/2019, 7:53 AM

## 2019-07-08 DIAGNOSIS — F329 Major depressive disorder, single episode, unspecified: Secondary | ICD-10-CM | POA: Diagnosis not present

## 2019-07-08 DIAGNOSIS — Z471 Aftercare following joint replacement surgery: Secondary | ICD-10-CM | POA: Diagnosis not present

## 2019-07-08 DIAGNOSIS — Z6838 Body mass index (BMI) 38.0-38.9, adult: Secondary | ICD-10-CM | POA: Diagnosis not present

## 2019-07-08 DIAGNOSIS — K219 Gastro-esophageal reflux disease without esophagitis: Secondary | ICD-10-CM | POA: Diagnosis not present

## 2019-07-08 DIAGNOSIS — I471 Supraventricular tachycardia: Secondary | ICD-10-CM | POA: Diagnosis not present

## 2019-07-08 DIAGNOSIS — E669 Obesity, unspecified: Secondary | ICD-10-CM | POA: Diagnosis not present

## 2019-07-08 DIAGNOSIS — I2510 Atherosclerotic heart disease of native coronary artery without angina pectoris: Secondary | ICD-10-CM | POA: Diagnosis not present

## 2019-07-08 DIAGNOSIS — Z85828 Personal history of other malignant neoplasm of skin: Secondary | ICD-10-CM | POA: Diagnosis not present

## 2019-07-08 DIAGNOSIS — I10 Essential (primary) hypertension: Secondary | ICD-10-CM | POA: Diagnosis not present

## 2019-07-08 DIAGNOSIS — I358 Other nonrheumatic aortic valve disorders: Secondary | ICD-10-CM | POA: Diagnosis not present

## 2019-07-08 DIAGNOSIS — Z96653 Presence of artificial knee joint, bilateral: Secondary | ICD-10-CM | POA: Diagnosis not present

## 2019-07-08 DIAGNOSIS — F419 Anxiety disorder, unspecified: Secondary | ICD-10-CM | POA: Diagnosis not present

## 2019-07-08 DIAGNOSIS — N3281 Overactive bladder: Secondary | ICD-10-CM | POA: Diagnosis not present

## 2019-07-08 DIAGNOSIS — I252 Old myocardial infarction: Secondary | ICD-10-CM | POA: Diagnosis not present

## 2019-07-15 MED FILL — METOPROLOL SUCCINATE ER 25: 25 | 30 days supply | Qty: 30 | Fill #1

## 2019-07-15 MED FILL — ATORVASTATIN 40 MG TABLET: 40 | 30 days supply | Qty: 30 | Fill #1

## 2019-07-16 MED FILL — OXYCODONE-ACETAMINOPHEN 5-3: 5-325 | 7 days supply | Qty: 30 | Fill #0

## 2019-07-17 MED FILL — tiZANidine HCL 2 MG TABS: 2 | 15 days supply | Qty: 60 | Fill #0

## 2019-07-20 DIAGNOSIS — Z471 Aftercare following joint replacement surgery: Secondary | ICD-10-CM | POA: Diagnosis not present

## 2019-07-20 DIAGNOSIS — Z96651 Presence of right artificial knee joint: Secondary | ICD-10-CM | POA: Diagnosis not present

## 2019-07-20 MED FILL — ALENDRONATE NA 70 MG TAB: 70 | 28 days supply | Qty: 4 | Fill #2

## 2019-07-22 DIAGNOSIS — Z96651 Presence of right artificial knee joint: Secondary | ICD-10-CM | POA: Diagnosis not present

## 2019-07-22 DIAGNOSIS — M25661 Stiffness of right knee, not elsewhere classified: Secondary | ICD-10-CM | POA: Diagnosis not present

## 2019-07-22 DIAGNOSIS — R262 Difficulty in walking, not elsewhere classified: Secondary | ICD-10-CM | POA: Diagnosis not present

## 2019-07-27 DIAGNOSIS — R262 Difficulty in walking, not elsewhere classified: Secondary | ICD-10-CM | POA: Diagnosis not present

## 2019-07-27 DIAGNOSIS — M25661 Stiffness of right knee, not elsewhere classified: Secondary | ICD-10-CM | POA: Diagnosis not present

## 2019-07-27 DIAGNOSIS — Z96651 Presence of right artificial knee joint: Secondary | ICD-10-CM | POA: Diagnosis not present

## 2019-07-29 DIAGNOSIS — Z96651 Presence of right artificial knee joint: Secondary | ICD-10-CM | POA: Diagnosis not present

## 2019-07-29 DIAGNOSIS — R262 Difficulty in walking, not elsewhere classified: Secondary | ICD-10-CM | POA: Diagnosis not present

## 2019-07-29 DIAGNOSIS — M25661 Stiffness of right knee, not elsewhere classified: Secondary | ICD-10-CM | POA: Diagnosis not present

## 2019-07-29 MED FILL — OXYCODONE-ACETAMINOPHEN 5-3: 5-325 | 8 days supply | Qty: 30 | Fill #0

## 2019-07-29 MED FILL — tiZANidine HCL 2 MG TABS: 2 | 20 days supply | Qty: 60 | Fill #0

## 2019-08-02 DIAGNOSIS — R262 Difficulty in walking, not elsewhere classified: Secondary | ICD-10-CM | POA: Diagnosis not present

## 2019-08-02 DIAGNOSIS — M25661 Stiffness of right knee, not elsewhere classified: Secondary | ICD-10-CM | POA: Diagnosis not present

## 2019-08-02 DIAGNOSIS — Z96651 Presence of right artificial knee joint: Secondary | ICD-10-CM | POA: Diagnosis not present

## 2019-08-05 DIAGNOSIS — R262 Difficulty in walking, not elsewhere classified: Secondary | ICD-10-CM | POA: Diagnosis not present

## 2019-08-05 DIAGNOSIS — M25661 Stiffness of right knee, not elsewhere classified: Secondary | ICD-10-CM | POA: Diagnosis not present

## 2019-08-05 DIAGNOSIS — Z96651 Presence of right artificial knee joint: Secondary | ICD-10-CM | POA: Diagnosis not present

## 2019-08-09 DIAGNOSIS — M25661 Stiffness of right knee, not elsewhere classified: Secondary | ICD-10-CM | POA: Diagnosis not present

## 2019-08-09 DIAGNOSIS — R262 Difficulty in walking, not elsewhere classified: Secondary | ICD-10-CM | POA: Diagnosis not present

## 2019-08-09 DIAGNOSIS — Z96651 Presence of right artificial knee joint: Secondary | ICD-10-CM | POA: Diagnosis not present

## 2019-08-12 DIAGNOSIS — R262 Difficulty in walking, not elsewhere classified: Secondary | ICD-10-CM | POA: Diagnosis not present

## 2019-08-12 DIAGNOSIS — Z96651 Presence of right artificial knee joint: Secondary | ICD-10-CM | POA: Diagnosis not present

## 2019-08-12 DIAGNOSIS — M25661 Stiffness of right knee, not elsewhere classified: Secondary | ICD-10-CM | POA: Diagnosis not present

## 2019-08-13 MED FILL — tiZANidine HCL 2 MG TABS: 2 | 15 days supply | Qty: 60 | Fill #0

## 2019-08-16 DIAGNOSIS — R262 Difficulty in walking, not elsewhere classified: Secondary | ICD-10-CM | POA: Diagnosis not present

## 2019-08-16 DIAGNOSIS — M25661 Stiffness of right knee, not elsewhere classified: Secondary | ICD-10-CM | POA: Diagnosis not present

## 2019-08-16 DIAGNOSIS — Z96651 Presence of right artificial knee joint: Secondary | ICD-10-CM | POA: Diagnosis not present

## 2019-08-17 MED FILL — OXYCODONE-ACETAMINOPHEN 5-3: 5-325 | 10 days supply | Qty: 30 | Fill #0

## 2019-08-18 MED FILL — ALENDRONATE NA 70 MG TAB: 70 | 28 days supply | Qty: 4 | Fill #3

## 2019-08-18 MED FILL — ATORVASTATIN 40 MG TABLET: 40 | 30 days supply | Qty: 30 | Fill #2

## 2019-08-18 MED FILL — METOPROLOL SUCCINATE ER 25: 25 | 30 days supply | Qty: 30 | Fill #2

## 2019-08-19 DIAGNOSIS — R262 Difficulty in walking, not elsewhere classified: Secondary | ICD-10-CM | POA: Diagnosis not present

## 2019-08-19 DIAGNOSIS — Z96651 Presence of right artificial knee joint: Secondary | ICD-10-CM | POA: Diagnosis not present

## 2019-08-19 DIAGNOSIS — M25661 Stiffness of right knee, not elsewhere classified: Secondary | ICD-10-CM | POA: Diagnosis not present

## 2019-08-23 DIAGNOSIS — Z96651 Presence of right artificial knee joint: Secondary | ICD-10-CM | POA: Diagnosis not present

## 2019-08-23 DIAGNOSIS — M25661 Stiffness of right knee, not elsewhere classified: Secondary | ICD-10-CM | POA: Diagnosis not present

## 2019-08-23 DIAGNOSIS — R262 Difficulty in walking, not elsewhere classified: Secondary | ICD-10-CM | POA: Diagnosis not present

## 2019-08-25 DIAGNOSIS — M25661 Stiffness of right knee, not elsewhere classified: Secondary | ICD-10-CM | POA: Diagnosis not present

## 2019-08-25 DIAGNOSIS — R262 Difficulty in walking, not elsewhere classified: Secondary | ICD-10-CM | POA: Diagnosis not present

## 2019-08-25 DIAGNOSIS — Z96651 Presence of right artificial knee joint: Secondary | ICD-10-CM | POA: Diagnosis not present

## 2019-08-30 DIAGNOSIS — M25661 Stiffness of right knee, not elsewhere classified: Secondary | ICD-10-CM | POA: Diagnosis not present

## 2019-08-30 DIAGNOSIS — Z96651 Presence of right artificial knee joint: Secondary | ICD-10-CM | POA: Diagnosis not present

## 2019-08-30 DIAGNOSIS — R262 Difficulty in walking, not elsewhere classified: Secondary | ICD-10-CM | POA: Diagnosis not present

## 2019-08-30 MED FILL — SERTRALINE HCL 100 MG TAB: 100 | 90 days supply | Qty: 90 | Fill #0

## 2019-08-31 ENCOUNTER — Telehealth: Payer: Self-pay | Admitting: *Deleted

## 2019-08-31 NOTE — Telephone Encounter (Signed)
CALLED LEFT MESSAGE FOR patient - appointment has been switch to virtual visit 09/01/19 8:40 am If she prefers in office  Will schedule for 12/6/ with dr harding.   instructions given for virtual visit 09/01/19.

## 2019-09-01 ENCOUNTER — Telehealth: Payer: Medicare Other | Admitting: Cardiology

## 2019-09-02 ENCOUNTER — Other Ambulatory Visit: Payer: Self-pay

## 2019-09-02 ENCOUNTER — Ambulatory Visit (INDEPENDENT_AMBULATORY_CARE_PROVIDER_SITE_OTHER): Payer: Medicare Other | Admitting: Cardiology

## 2019-09-02 VITALS — BP 174/84 | HR 94 | Temp 97.3°F | Ht 65.0 in | Wt 233.2 lb

## 2019-09-02 DIAGNOSIS — I471 Supraventricular tachycardia, unspecified: Secondary | ICD-10-CM

## 2019-09-02 DIAGNOSIS — Z96651 Presence of right artificial knee joint: Secondary | ICD-10-CM | POA: Diagnosis not present

## 2019-09-02 DIAGNOSIS — E785 Hyperlipidemia, unspecified: Secondary | ICD-10-CM | POA: Diagnosis not present

## 2019-09-02 DIAGNOSIS — E669 Obesity, unspecified: Secondary | ICD-10-CM

## 2019-09-02 DIAGNOSIS — I248 Other forms of acute ischemic heart disease: Secondary | ICD-10-CM | POA: Diagnosis not present

## 2019-09-02 DIAGNOSIS — I10 Essential (primary) hypertension: Secondary | ICD-10-CM

## 2019-09-02 DIAGNOSIS — M1712 Unilateral primary osteoarthritis, left knee: Secondary | ICD-10-CM

## 2019-09-02 DIAGNOSIS — R262 Difficulty in walking, not elsewhere classified: Secondary | ICD-10-CM | POA: Diagnosis not present

## 2019-09-02 DIAGNOSIS — I2510 Atherosclerotic heart disease of native coronary artery without angina pectoris: Secondary | ICD-10-CM | POA: Diagnosis not present

## 2019-09-02 DIAGNOSIS — I2489 Other forms of acute ischemic heart disease: Secondary | ICD-10-CM

## 2019-09-02 DIAGNOSIS — M25661 Stiffness of right knee, not elsewhere classified: Secondary | ICD-10-CM | POA: Diagnosis not present

## 2019-09-02 MED ORDER — METOPROLOL SUCCINATE ER 50 MG PO TB24: 50.0000 mg | ORAL_TABLET | Freq: Every day | ORAL | 3 refills | Status: DC

## 2019-09-02 MED ORDER — ASPIRIN EC 81 MG PO TBEC: 81.0000 mg | DELAYED_RELEASE_TABLET | Freq: Every day | ORAL | 11 refills | Status: DC

## 2019-09-02 MED FILL — METOPROLOL SUCCINATE ER 50: 50 | 90 days supply | Qty: 90 | Fill #0

## 2019-09-02 NOTE — Progress Notes (Signed)
Primary Care Provider: Maurice Small, MD Cardiologist: Glenetta Hew, MD Electrophysiologist:   Clinic Note: Chief Complaint  Patient presents with  . Follow-up  . Tachycardia    Status post hospitalization for SVT.  No recurrence  . Coronary Artery Disease    Moderate to severe LAD disease, no angina    HPI:    Cynthia [name] is a 68 y.o. female with a PMH below who presents today for 78-month follow-up.  Recent Hospitalizations:   August 24-25 2020: Initially presented to [facility] with chest pain dizziness with palpitations noted to be in SVT at rate of 85 bpm.  6 mg IV adenosine was administered converting to sinus rhythm, however EKG suggested inferior ST elevations.  She was therefore transferred as a code STEMI.  Noted maybe 60 to 65% proximal LAD stenosis but no occlusive disease.  Recommended medical management. ->  Started on Toprol as well as aspirin and statin, vagal maneuvers discussed.  Cynthia [name] was last seen on June 02, 2019 for hospital follow-up but also preop for knee surgery.  Noted some exertional dyspnea related to knee pain.  With the level of knee pain unable to complete 4 METS.  No angina. -->  Surgical clearance provided.   Overnight hospitalization October 5-6/20/2024 TOTAL RIGHT KNEE REPLACEMENT.  Tolerated without any difficulties.  Reviewed  CV studies:    The following studies were reviewed today: (if available, images/films reviewed: From Epic Chart or Care Everywhere)  Echocardiogram 05/24/2019: Normal LV size and function.  EF 55 to 60%.  No RWM A.  Moderate aortic sclerosis with no stenosis.  LEFT HEART CATH-CORONARY ANGIOGRAPHY 05/24/2019: Moderate-severe noncritical single-vessel CAD-60-70% proximal LAD at origin of large D1 ([place] 1, 10, 0)-recommend medical management.  Diagnostic Dominance: Right    Interval History:   Cynthia [name] now presents following her knee surgery for her second follow-up after SVT  hospitalization with cardiac catheterization identifying moderate to severe LAD disease.  She is gradually getting back into her routine.  Knee is stiff but doing fairly well.  Still a bit swollen.  She is open to have her left knee done soon.  Once that is done she will be able to be more active walking more. She has been relatively asymptomatic from a cardiac standpoint a few skipped beats here and there but nothing more than that.  No prolonged heart rate spells.  No chest pain or pressure with rest or dyspnea.  She is quick to point out that she has never had chest pain until she had that episode of very fast heart rates.  She is little bit stressed out today and understandably so: Her sister just passed away this morning after long flight with chronic condition and her father died just last month after suffering a fall.  She has not taken any blood pressure medications today.  CV Review of Symptoms (Summary): no chest pain or dyspnea on exertion positive for - Occasional skipped beats.  Lots of social stress today.  She has swelling but mostly related to her knee surgery. negative for - orthopnea, paroxysmal nocturnal dyspnea, rapid heart rate, shortness of breath or Syncope/near syncope, TIA/amaurosis fugax, claudication.  The patient does not have symptoms concerning for COVID-19 infection (fever, chills, cough, or new shortness of breath).  The patient is practicing social distancing. ++ Masking.  She is now back going out occasionally for groceries/shopping.    REVIEWED OF SYSTEMS   A comprehensive ROS was performed. Review of  Systems  Constitutional: Negative for malaise/fatigue (Gradually regaining energy) and weight loss.  HENT: Negative for congestion and nosebleeds.   Respiratory: Negative for cough, shortness of breath and wheezing.   Cardiovascular: Positive for leg swelling (Postop swelling).  Musculoskeletal: Positive for joint pain (Right knee is stiff, left knee hurts her  now). Negative for falls.  Neurological: Negative for dizziness, focal weakness, weakness and headaches.  Endo/Heme/Allergies: Does not bruise/bleed easily.  Psychiatric/Behavioral: Negative for depression, memory loss and suicidal ideas. The patient is not nervous/anxious and does not have insomnia.        Quite distressed today having just found out that her sister just died and is dealing with her father having died last month.  All other systems reviewed and are negative.  I have reviewed and (if needed) personally updated the patient's problem list, medications, allergies, past medical and surgical history, social and family history.   PAST MEDICAL HISTORY   Past Medical History:  Diagnosis Date  . Anemia   . Anxiety disorder   . Aortic valve sclerosis 05/24/2019   Moderate noted on ECHO  . Arthritis    right knee  . Cancer of the skin, basal cell   . Coronary artery disease    Moderate to severe, non-critical single-vessel coronary artery disease with 60-70% proximal LAD stenosis at origin of large diagonal branch ([place] 1,0,0).  . Demand ischemia ([place]) 05/24/2019  . Depression   . GERD (gastroesophageal reflux disease)    TUMS  . Hypertension   . Lateral meniscus derangement, right   . SVT (supraventricular tachycardia) ([place])      PAST SURGICAL HISTORY   Past Surgical History:  Procedure Laterality Date  . CATARACT EXTRACTION, BILATERAL    . CESAREAN SECTION     x3  . CHONDROPLASTY Right 02/21/2017   Procedure: CHONDROPLASTY;  Surgeon: Melrose Nakayama, MD;  Location: [place];  Service: Orthopedics;  Laterality: Right;  . COLONOSCOPY    . KNEE ARTHROSCOPY WITH LATERAL MENISECTOMY Right 02/21/2017   Procedure: KNEE ARTHROSCOPY WITH LATERAL MENISECTOMY;  Surgeon: Melrose Nakayama, MD;  Location: [place];  Service: Orthopedics;  Laterality: Right;  . KNEE ARTHROSCOPY WITH MEDIAL MENISECTOMY Right 02/21/2017   Procedure: KNEE ARTHROSCOPY  WITH MEDIAL MENISECTOMY;  Surgeon: Melrose Nakayama, MD;  Location: [place];  Service: Orthopedics;  Laterality: Right;  . LEFT HEART CATH AND CORONARY ANGIOGRAPHY N/A 05/24/2019   Procedure: LEFT HEART CATH AND CORONARY ANGIOGRAPHY;  Surgeon: Nelva Bush, MD;  Location: [facility];  Service: Cardiovascular;  Laterality: N/A;  . MOUTH SURGERY    . TOTAL KNEE ARTHROPLASTY Right 07/05/2019   Procedure: RIGHT TOTAL KNEE ARTHROPLASTY;  Surgeon: Frederik Pear, MD;  Location: [facility];  Service: Orthopedics;  Laterality: Right;     MEDICATIONS/ALLERGIES   Current Meds  Medication Sig  . alendronate (FOSAMAX) 70 MG tablet Take 70 mg by mouth once a week.   . ALPRAZolam (XANAX) 0.25 MG tablet Take 0.25 mg by mouth at bedtime as needed for anxiety.  Marland Kitchen aspirin EC 81 MG tablet Take 1 tablet (81 mg total) by mouth daily.  Marland Kitchen atorvastatin (LIPITOR) 40 MG tablet Take 1 tablet (40 mg total) by mouth daily at 6 PM.  . buPROPion (WELLBUTRIN XL) 300 MG 24 hr tablet Take 300 mg by mouth daily.  . Calcium Carbonate (CALCIUM 500 PO) Take 500 mg by mouth daily.   . ferrous sulfate 325 (65 FE) MG tablet Take 325 mg by  mouth daily with breakfast.  . Multiple Vitamins-Minerals (MULTIVITAMIN ADULT PO) Take 1 tablet by mouth daily.  . mupirocin ointment (BACTROBAN) 2 %   . nitrofurantoin (MACRODANTIN) 100 MG capsule Take 100 mg by mouth daily as needed (UTI).   . nitroGLYCERIN (NITROSTAT) 0.4 MG SL tablet Place 1 tablet (0.4 mg total) under the tongue every 5 (five) minutes x 3 doses as needed for chest pain.  Marland Kitchen sertraline (ZOLOFT) 50 MG tablet Take 50 mg by mouth daily.  . Vitamin D, Ergocalciferol, (DRISDOL) 1.25 MG (50000 UT) CAPS capsule   . [DISCONTINUED] aspirin EC 81 MG tablet Take 1 tablet (81 mg total) by mouth 2 (two) times daily.  . [DISCONTINUED] metoprolol succinate (TOPROL-XL) 25 MG 24 hr tablet Take 1 tablet (25 mg total) by mouth daily.  . [DISCONTINUED]  oxyCODONE-acetaminophen (PERCOCET/ROXICET) 5-325 MG tablet Take 1 tablet by mouth every 4 (four) hours as needed for severe pain.  . [DISCONTINUED] tiZANidine (ZANAFLEX) 2 MG tablet Take 1 tablet (2 mg total) by mouth every 6 (six) hours as needed.    Allergies  Allergen Reactions  . Penicillins Rash    Pt does not remember, states she was real young     SOCIAL HISTORY/FAMILY HISTORY   Social History   Tobacco Use  . Smoking status: Never Smoker  . Smokeless tobacco: Never Used  Substance Use Topics  . Alcohol use: Yes    Comment: social  . Drug use: Never   Social History   Social History Narrative  . Not on file    Family History family history includes Hypertension in her father.   OBJCTIVE -PE, EKG, labs   Wt Readings from Last 3 Encounters:  09/02/19 233 lb 3.2 oz (105.8 kg)  07/05/19 235 lb 9 oz (106.8 kg)  06/29/19 235 lb 9 oz (106.9 kg)    Physical Exam: BP (!) 174/84   Pulse 94   Temp (!) 97.3 F (36.3 C)   Ht 5\' 5"  (1.651 m)   Wt 233 lb 3.2 oz (105.8 kg)   SpO2 95%   BMI 38.81 kg/m  Physical Exam  Constitutional: She is oriented to person, place, and time. She appears well-developed and well-nourished.  Borderline morbidly obese woman.  No acute distress.  Well-groomed  HENT:  Head: Normocephalic and atraumatic.  Neck: Normal range of motion. Neck supple. No hepatojugular reflux and no JVD present. Carotid bruit is not present. No thyromegaly present.  Cardiovascular: Normal rate, regular rhythm, S1 normal and S2 normal.  No extrasystoles are present. PMI is not displaced (Difficult to palpate). Exam reveals distant heart sounds and decreased pulses (1+ pedal pulses probably because of body habitus.). Exam reveals no gallop and no friction rub.  Murmur heard. High-pitched harsh early systolic murmur is present with a grade of 1/6 at the upper right sternal border radiating to the neck. Pulmonary/Chest: Breath sounds normal. No respiratory  distress. She has no wheezes. She has no rales.  Abdominal: Soft. Bowel sounds are normal. She exhibits no distension. There is no abdominal tenderness. There is no rebound.  Unable to assess HSM due to body habitus  Musculoskeletal:        General: No edema.     Comments: Mildly decreased right knee range of motion postop swelling still present.  Neurological: She is alert and oriented to person, place, and time.  Psychiatric: She has a normal mood and affect. Her behavior is normal. Judgment and thought content normal.  Vitals reviewed.   Adult  ECG Report  Rate: 84 ;  Rhythm: normal sinus rhythm and Nonspecific ST and T wave changes.  Otherwise normal axis, intervals durations.;   Narrative Interpretation: Relatively normal EKG.  Recent Labs:    Lab Results  Component Value Date   CHOL 158 05/24/2019   HDL 51 05/24/2019   LDLCALC 72 05/24/2019   TRIG 175 (H) 05/24/2019   CHOLHDL 3.1 05/24/2019   Lab Results  Component Value Date   CREATININE 0.80 07/06/2019   BUN 15 07/06/2019   NA 137 07/06/2019   K 4.3 07/06/2019   CL 106 07/06/2019   CO2 24 07/06/2019    ASSESSMENT/PLAN    Problem List Items Addressed This Visit    Coronary artery disease, 60% LAD prior to D1 - Primary (Chronic)    Interesting single-vessel disease which clearly would lead to some ischemia in the setting of SVT.  It is very unlikely, absence of recurrence of SVT that she would get her heart rate nearly fast enough to cause ischemia that was noted during her SVT episode. Plan: Aggressive restart medication with aspirin, statin and beta-blocker. Provided she does not have any recurrent angina, no reason to treat with PCI (would hope to avoid need for PCI given the proximity to the major diagonal branch.  In the absence of symptoms, I do not think she would require stress test.  If she is doing well within the next 6 months, should be fine to have her other knee replacement surgery.      Relevant  Medications   aspirin EC 81 MG tablet   metoprolol succinate (TOPROL-XL) 50 MG 24 hr tablet   Other Relevant Orders   EKG 12-Lead   Hepatic function panel   Lipid panel   SVT (supraventricular tachycardia) (HCC) (Chronic)    No further episodes of SVT.  Provided that there are not frequent recurrences, I think were probably okay holding off with considering ablation procedures, however if she has more recurrence, given the fact that she is prone for demand ischemia, I think that not unreasonable to consider ablation.  For now we discussed vagal maneuvers and importance of actually seeking out help quickly.  Plan: Increase Toprol to 50 mg daily.      Relevant Medications   aspirin EC 81 MG tablet   metoprolol succinate (TOPROL-XL) 50 MG 24 hr tablet   Other Relevant Orders   EKG 12-Lead   Essential hypertension (Chronic)    She truly has a reason for her blood pressure being elevated today, but the fact that she is able to mount such a high blood pressure response to stress and anxiety reason she probably does require more aggressive management.  Plan: Titrate Toprol to 50 mg daily.  Monitor pressures and consider additional medications if necessary.      Relevant Medications   aspirin EC 81 MG tablet   metoprolol succinate (TOPROL-XL) 50 MG 24 hr tablet   Hyperlipidemia with target LDL less than 70 (Chronic)    Reiterated importance of aggressive risk factor modification to avoid progression of LAD disease. Most recent lipids showed notably improved LDL of 72 which is improved from 126 in June.  Not sure how accurate these labs are.  She should be due for recheck labs soon.--We will order fasting lipid panel and LFT panel  For now continue current dose of atorvastatin.      Relevant Medications   aspirin EC 81 MG tablet   metoprolol succinate (TOPROL-XL) 50 MG 24 hr tablet  Other Relevant Orders   Hepatic function panel   Lipid panel   Demand ischemia ([place])    Not  unexpectedly in the setting of significant SVT with existing moderate severe CAD, she had troponin elevation.  Her initial post conversion EKG did show initial ischemic changes in the inferior wall which would potentially go along with distal LAD distribution, however this was only briefly and then resolved.  Minimal troponin elevation and no wall motion normality.  No sign of true MI.      Relevant Medications   aspirin EC 81 MG tablet   metoprolol succinate (TOPROL-XL) 50 MG 24 hr tablet   Other Relevant Orders   EKG 12-Lead   Obesity with body mass index 30 or greater   Osteoarthritis of left knee    She just had her right knee surgery done and it seems to be doing relatively well.  There has been discussion of probably needing to do left knee arthroplasty as well.  She is hoping this will be done early next year.  Provided this occurs within the next 6 months, I will see any need for any further preop cardiac evaluation.  I will see her back in 6 months, and can provide preoperative evaluation if the surgery is not done prior to that time.  According to the RCRI, she has 0.9% risk of a major cardiac event in the perioperative period. OK to hold ASA for 7 days prior to surgery if needed        Relevant Medications   aspirin EC 81 MG tablet       COVID-19 Education: The signs and symptoms of COVID-19 were discussed with the patient and how to seek care for testing (follow up with PCP or arrange E-visit).   The importance of social distancing was discussed today.  I spent a total of 24 minutes with the patient and chart review. >  50% of the time was spent in direct patient consultation.  Additional time spent with chart review (studies, outside notes, etc): 14 Total Time: 64min -With medical adjustment and preoperative evaluation, high complexity medical decision making.  Current medicines are reviewed at length with the patient today.  (+/- concerns) n/a   Patient Instructions  / Medication Changes & Studies & Tests Ordered   Patient Instructions  Medication Instructions:  Decrease to 81 mg aspirin  One tablet daily     increase Toprol XL to 50 mg - one tablet daily    *If you need a refill on your cardiac medications before your next appointment, please call your pharmacy*  Lab Work: Lipid   hepatic  Panel - fasting  If you have labs (blood work) drawn today and your tests are completely normal, you will receive your results only by: Marland Kitchen MyChart Message (if you have MyChart) OR . A paper copy in the mail If you have any lab test that is abnormal or we need to change your treatment, we will call you to review the results.  Testing/Procedures: NOT NEEDED  Follow-Up: At [facility], you and your health needs are our priority.  As part of our continuing mission to provide you with exceptional heart care, we have created designated Provider Care Teams.  These Care Teams include your primary Cardiologist (physician) and Advanced Practice Providers (APPs -  Physician Assistants and Nurse Practitioners) who all work together to provide you with the care you need, when you need it.  Your next appointment:   6 month(s)  The  format for your next appointment:   In Person  Provider:   Glenetta Hew, MD  Other Instructions  It will be okay to have surgery before next office visit  Surgeon will have to send the official  Request.    AVOID TRIGGERS LIKE STRESS , USING CAFFEINE,  DEHYDRATION   Recommendations for vagal maneuvers:  "Bearing down"  Coughing  Gagging  Icy, cold towel on face or drink ice cold water      Studies Ordered:   Orders Placed This Encounter  Procedures  . Hepatic function panel  . Lipid panel  . EKG 12-Lead     Glenetta Hew, M.D., M.S. Interventional Cardiologist   Pager # [phone] Phone # [phone] [address] [place], [place] [address]   Thank you for choosing Heartcare at [facility]!!

## 2019-09-02 NOTE — Patient Instructions (Signed)
Medication Instructions:  Decrease to 81 mg aspirin  One tablet daily     increase Toprol XL to 50 mg - one tablet daily    *If you need a refill on your cardiac medications before your next appointment, please call your pharmacy*  Lab Work: Lipid   hepatic  Panel - fasting  If you have labs (blood work) drawn today and your tests are completely normal, you will receive your results only by: Marland Kitchen MyChart Message (if you have MyChart) OR . A paper copy in the mail If you have any lab test that is abnormal or we need to change your treatment, we will call you to review the results.  Testing/Procedures: NOT NEEDED  Follow-Up: At [facility], you and your health needs are our priority.  As part of our continuing mission to provide you with exceptional heart care, we have created designated Provider Care Teams.  These Care Teams include your primary Cardiologist (physician) and Advanced Practice Providers (APPs -  Physician Assistants and Nurse Practitioners) who all work together to provide you with the care you need, when you need it.  Your next appointment:   6 month(s)  The format for your next appointment:   In Person  Provider:   Glenetta Hew, MD  Other Instructions  It will be okay to have surgery before next office visit  Surgeon will have to send the official  Request.    AVOID TRIGGERS LIKE STRESS , USING CAFFEINE,  DEHYDRATION   Recommendations for vagal maneuvers:  "Bearing down"  Coughing  Gagging  Icy, cold towel on face or drink ice cold water

## 2019-09-05 ENCOUNTER — Encounter: Payer: Self-pay | Admitting: Cardiology

## 2019-09-05 DIAGNOSIS — M1712 Unilateral primary osteoarthritis, left knee: Secondary | ICD-10-CM | POA: Insufficient documentation

## 2019-09-05 NOTE — Assessment & Plan Note (Signed)
Interesting single-vessel disease which clearly would lead to some ischemia in the setting of SVT.  It is very unlikely, absence of recurrence of SVT that she would get her heart rate nearly fast enough to cause ischemia that was noted during her SVT episode. Plan: Aggressive restart medication with aspirin, statin and beta-blocker. Provided she does not have any recurrent angina, no reason to treat with PCI (would hope to avoid need for PCI given the proximity to the major diagonal branch.  In the absence of symptoms, I do not think she would require stress test.  If she is doing well within the next 6 months, should be fine to have her other knee replacement surgery.

## 2019-09-05 NOTE — Assessment & Plan Note (Addendum)
She just had her right knee surgery done and it seems to be doing relatively well.  There has been discussion of probably needing to do left knee arthroplasty as well.  She is hoping this will be done early next year.  Provided this occurs within the next 6 months, I will see any need for any further preop cardiac evaluation.  I will see her back in 6 months, and can provide preoperative evaluation if the surgery is not done prior to that time.  According to the RCRI, she has 0.9% risk of a major cardiac event in the perioperative period. OK to hold ASA for 7 days prior to surgery if needed

## 2019-09-05 NOTE — Assessment & Plan Note (Signed)
Reiterated importance of aggressive risk factor modification to avoid progression of LAD disease. Most recent lipids showed notably improved LDL of 72 which is improved from 126 in June.  Not sure how accurate these labs are.  She should be due for recheck labs soon.--We will order fasting lipid panel and LFT panel  For now continue current dose of atorvastatin.

## 2019-09-05 NOTE — Assessment & Plan Note (Signed)
No further episodes of SVT.  Provided that there are not frequent recurrences, I think were probably okay holding off with considering ablation procedures, however if she has more recurrence, given the fact that she is prone for demand ischemia, I think that not unreasonable to consider ablation.  For now we discussed vagal maneuvers and importance of actually seeking out help quickly.  Plan: Increase Toprol to 50 mg daily.

## 2019-09-05 NOTE — Assessment & Plan Note (Signed)
Not unexpectedly in the setting of significant SVT with existing moderate severe CAD, she had troponin elevation.  Her initial post conversion EKG did show initial ischemic changes in the inferior wall which would potentially go along with distal LAD distribution, however this was only briefly and then resolved.  Minimal troponin elevation and no wall motion normality.  No sign of true MI.

## 2019-09-05 NOTE — Assessment & Plan Note (Signed)
She truly has a reason for her blood pressure being elevated today, but the fact that she is able to mount such a high blood pressure response to stress and anxiety reason she probably does require more aggressive management.  Plan: Titrate Toprol to 50 mg daily.  Monitor pressures and consider additional medications if necessary.

## 2019-09-07 DIAGNOSIS — I2510 Atherosclerotic heart disease of native coronary artery without angina pectoris: Secondary | ICD-10-CM | POA: Diagnosis not present

## 2019-09-07 DIAGNOSIS — E785 Hyperlipidemia, unspecified: Secondary | ICD-10-CM | POA: Diagnosis not present

## 2019-09-07 LAB — LIPID PANEL
Chol/HDL Ratio: 2.5 ratio (ref 0.0–4.4)
Cholesterol, Total: 141 mg/dL (ref 100–199)
HDL: 57 mg/dL (ref >39)
LDL Chol Calc (NIH): 68 mg/dL (ref 0–99)
Triglycerides: 86 mg/dL (ref 0–149)
VLDL Cholesterol Cal: 16 mg/dL (ref 5–40)

## 2019-09-07 LAB — HEPATIC FUNCTION PANEL
ALT: 55 IU/L — ABNORMAL HIGH (ref 0–32)
AST: 32 IU/L (ref 0–40)
Albumin: 4.5 g/dL (ref 3.8–4.8)
Alkaline Phosphatase: 93 IU/L (ref 39–117)
Bilirubin Total: 0.6 mg/dL (ref 0.0–1.2)
Bilirubin, Direct: 0.18 mg/dL (ref 0.00–0.40)
Total Protein: 6.6 g/dL (ref 6.0–8.5)

## 2019-09-09 DIAGNOSIS — Z96651 Presence of right artificial knee joint: Secondary | ICD-10-CM | POA: Diagnosis not present

## 2019-09-09 DIAGNOSIS — M25661 Stiffness of right knee, not elsewhere classified: Secondary | ICD-10-CM | POA: Diagnosis not present

## 2019-09-09 DIAGNOSIS — R262 Difficulty in walking, not elsewhere classified: Secondary | ICD-10-CM | POA: Diagnosis not present

## 2019-09-13 ENCOUNTER — Telehealth: Payer: Self-pay | Admitting: Cardiology

## 2019-09-13 NOTE — Telephone Encounter (Signed)
Pt called to report that she has been having body aching for several weeks... she believes it is coming from the Lipitor 40 mg .. she is asking if she can hold it for a while to see if she notices a difference.  Will forward to Dr. Ellyn Hack for review.

## 2019-09-13 NOTE — Telephone Encounter (Signed)
New Message  I have pt on the phone and she said she just had knee replacement in October and was put on atorvastatin (LIPITOR) 40 MG tablet.....she experiencing body aches all over since she was put on the medicine and wants to know if that medicine has any side effects  Please call to discuss

## 2019-09-13 NOTE — Telephone Encounter (Signed)
Sure she may as well all the statin for 3 to 4 weeks.  If symptoms improve then we will switch her to rosuvastatin 20 mg.  Glenetta Hew, MD

## 2019-09-15 DIAGNOSIS — Z96651 Presence of right artificial knee joint: Secondary | ICD-10-CM | POA: Diagnosis not present

## 2019-09-15 DIAGNOSIS — R262 Difficulty in walking, not elsewhere classified: Secondary | ICD-10-CM | POA: Diagnosis not present

## 2019-09-15 DIAGNOSIS — M25661 Stiffness of right knee, not elsewhere classified: Secondary | ICD-10-CM | POA: Diagnosis not present

## 2019-09-15 NOTE — Telephone Encounter (Signed)
LMTCB

## 2019-09-16 NOTE — Telephone Encounter (Signed)
Spoke with patient who is aware OK to hold statin for 3 to 4 weeks.  If she sees improvement in her muscle aches she will call back to let us know so that she can be started on rosuvastatin as per Dr Ellyn Hack.

## 2019-09-22 MED FILL — ALPRAZolam 0.5 MG TABS: 0.5 | 90 days supply | Qty: 270 | Fill #1

## 2019-09-22 MED FILL — BUPROPION HCL XL 300 MG TAB: 300 | 90 days supply | Qty: 90 | Fill #1

## 2019-10-04 MED FILL — NYSTATIN-TRIAMCINOLONE CRM: 100000-0.1 | 15 days supply | Qty: 30 | Fill #1

## 2019-10-08 ENCOUNTER — Other Ambulatory Visit: Payer: Self-pay | Admitting: Orthopedic Surgery

## 2019-10-08 ENCOUNTER — Telehealth: Payer: Self-pay | Admitting: *Deleted

## 2019-10-08 ENCOUNTER — Telehealth: Payer: Self-pay | Admitting: Cardiology

## 2019-10-08 NOTE — Telephone Encounter (Signed)
error 

## 2019-10-08 NOTE — Telephone Encounter (Signed)
   [facility] Pre-operative Risk Assessment    Request for surgical clearance:  1. What type of surgery is being performed? Left Knee replacement  2. When is this surgery scheduled? 10/20/19  3. What type of clearance is required (medical clearance vs. Pharmacy clearance to hold med vs. Both)? Both  4. Are there any medications that need to be held prior to surgery and how long? Not they know of.  5. Practice name and name of physician performing surgery? [facility]  6. What is your office phone number [phone]   7.   What is your office fax number [phone]  8.   Anesthesia type (None, local, MAC, general) ? Spinal   Cynthia [name] 10/08/2019, 2:10 PM  _________________________________________________________________   (provider comments below)

## 2019-10-08 NOTE — Telephone Encounter (Signed)
   Primary Cardiologist: Glenetta Hew, MD  Chart reviewed as part of pre-operative protocol coverage. Patient was contacted 10/08/2019 in reference to pre-operative risk assessment for pending surgery as outlined below.  Cynthia [name] was last seen on 09/02/2019 by Dr. Ellyn Hack.  Since that day, Cynthia [name] has done well from a cardiac standpoint. She had a right knee replacement in the fall which has improved her mobility. She can complete 4 METs without anginal complaints.   Therefore, based on ACC/AHA guidelines, the patient would be at acceptable risk for the planned procedure without further cardiovascular testing.  If needed, patient can hold aspirin 7 days prior to surgery and restart when cleared to do so by her orthopedist.    I will route this recommendation to the requesting party via [place] fax function and remove from pre-op pool.  Please call with questions.  Abigail Butts, PA-C 10/08/2019, 2:47 PM

## 2019-10-11 MED FILL — ALENDRONATE NA 70 MG TAB: 70 | 28 days supply | Qty: 4 | Fill #4

## 2019-10-13 ENCOUNTER — Encounter (HOSPITAL_COMMUNITY): Payer: Self-pay

## 2019-10-13 NOTE — Patient Instructions (Addendum)
DUE TO COVID-19 ONLY ONE VISITOR IS ALLOWED TO COME WITH YOU AND STAY IN THE WAITING ROOM ONLY DURING PRE OP AND PROCEDURE. THE ONE VISITOR MAY VISIT WITH YOU IN YOUR PRIVATE ROOM DURING VISITING HOURS ONLY!!   COVID SWAB TESTING  COMPLETED ON:  Saturday, Jan. 16, 2021  (Must self quarantine after testing. Follow instructions on handout.)             Your procedure is scheduled on: Wednesday, Jan. 20, 2021   Report to [facility] Main  Entrance    Report to admitting at 10:30 AM   Call this number if you have problems the morning of surgery [phone]   Do not eat food  :After Midnight.   May have liquids until 10:00 AM day of surgery   CLEAR LIQUID DIET  Foods Allowed                                                                     Foods Excluded  Water, Black Coffee and tea, regular and decaf                             liquids that you cannot  Plain Jell-O in any flavor  (No red)                                           see through such as: Fruit ices (not with fruit pulp)                                     milk, soups, orange juice  Iced Popsicles (No red)                                    All solid food Carbonated beverages, regular and diet                                    Apple juices Sports drinks like Gatorade (No red) Lightly seasoned clear broth or consume(fat free) Sugar, honey syrup  Sample Menu Breakfast                                Lunch                                     Supper Cranberry juice                    Beef broth                            Chicken broth Jell-O  Grape juice                           Apple juice Coffee or tea                        Jell-O                                      Popsicle                                                Coffee or tea                        Coffee or tea   Complete one Ensure drink the morning of surgery at 10:00 AM the day of surgery.   Brush your teeth  the morning of surgery.   Do NOT smoke after Midnight   Take these medicines the morning of surgery with A SIP OF WATER: Bupropion, Metoprolol, Sertraline                               You may not have any metal on your body including hair pins, jewelry, and body piercings             Do not wear make-up, lotions, powders, perfumes/cologne, or deodorant             Do not wear nail polish.  Do not shave  48 hours prior to surgery.               Do not bring valuables to the hospital. [place].   Contacts, dentures or bridgework may not be worn into surgery.    Patients discharged the day of surgery will not be allowed to drive home.   Special Instructions: Bring a copy of your healthcare power of attorney and living will documents         the day of surgery if you haven't scanned them in before.              Please read over the following fact sheets you were given:  [facility] - Preparing for Surgery Before surgery, you can play an important role.  Because skin is not sterile, your skin needs to be as free of germs as possible.  You can reduce the number of germs on your skin by washing with CHG (chlorahexidine gluconate) soap before surgery.  CHG is an antiseptic cleaner which kills germs and bonds with the skin to continue killing germs even after washing. Please DO NOT use if you have an allergy to CHG or antibacterial soaps.  If your skin becomes reddened/irritated stop using the CHG and inform your nurse when you arrive at Short Stay. Do not shave (including legs and underarms) for at least 48 hours prior to the first CHG shower.  You may shave your face/neck.  Please follow these instructions carefully:  1.  Shower with CHG Soap the night before surgery and the  morning of surgery.  2.  If you choose to wash your hair, wash your hair first as usual with your normal  shampoo.  3.  After you shampoo, rinse your hair and body  thoroughly to remove the shampoo.                             4.  Use CHG as you would any other liquid soap.  You can apply chg directly to the skin and wash.  Gently with a scrungie or clean washcloth.  5.  Apply the CHG Soap to your body ONLY FROM THE NECK DOWN.   Do   not use on face/ open                           Wound or open sores. Avoid contact with eyes, ears mouth and   genitals (private parts).                       Wash face,  Genitals (private parts) with your normal soap.             6.  Wash thoroughly, paying special attention to the area where your    surgery  will be performed.  7.  Thoroughly rinse your body with warm water from the neck down.  8.  DO NOT shower/wash with your normal soap after using and rinsing off the CHG Soap.                9.  Pat yourself dry with a clean towel.            10.  Wear clean pajamas.            11.  Place clean sheets on your bed the night of your first shower and do not  sleep with pets. Day of Surgery : Do not apply any lotions/deodorants the morning of surgery.  Please wear clean clothes to the hospital/surgery center.  FAILURE TO FOLLOW THESE INSTRUCTIONS MAY RESULT IN THE CANCELLATION OF YOUR SURGERY  PATIENT SIGNATURE_________________________________  NURSE SIGNATURE__________________________________  ________________________________________________________________________   Adam Phenix  An incentive spirometer is a tool that can help keep your lungs clear and active. This tool measures how well you are filling your lungs with each breath. Taking long deep breaths may help reverse or decrease the chance of developing breathing (pulmonary) problems (especially infection) following:  A long period of time when you are unable to move or be active. BEFORE THE PROCEDURE   If the spirometer includes an indicator to show your best effort, your nurse or respiratory therapist will set it to a desired goal.  If possible,  sit up straight or lean slightly forward. Try not to slouch.  Hold the incentive spirometer in an upright position. INSTRUCTIONS FOR USE  1. Sit on the edge of your bed if possible, or sit up as far as you can in bed or on a chair. 2. Hold the incentive spirometer in an upright position. 3. Breathe out normally. 4. Place the mouthpiece in your mouth and seal your lips tightly around it. 5. Breathe in slowly and as deeply as possible, raising the piston or the ball toward the top of the column. 6. Hold your breath for 3-5 seconds or for as long as possible. Allow the piston or ball to fall to the bottom of the column. 7. Remove the mouthpiece from  your mouth and breathe out normally. 8. Rest for a few seconds and repeat Steps 1 through 7 at least 10 times every 1-2 hours when you are awake. Take your time and take a few normal breaths between deep breaths. 9. The spirometer may include an indicator to show your best effort. Use the indicator as a goal to work toward during each repetition. 10. After each set of 10 deep breaths, practice coughing to be sure your lungs are clear. If you have an incision (the cut made at the time of surgery), support your incision when coughing by placing a pillow or rolled up towels firmly against it. Once you are able to get out of bed, walk around indoors and cough well. You may stop using the incentive spirometer when instructed by your caregiver.  RISKS AND COMPLICATIONS  Take your time so you do not get dizzy or light-headed.  If you are in pain, you may need to take or ask for pain medication before doing incentive spirometry. It is harder to take a deep breath if you are having pain. AFTER USE  Rest and breathe slowly and easily.  It can be helpful to keep track of a log of your progress. Your caregiver can provide you with a simple table to help with this. If you are using the spirometer at home, follow these instructions: [place] IF:   You  are having difficultly using the spirometer.  You have trouble using the spirometer as often as instructed.  Your pain medication is not giving enough relief while using the spirometer.  You develop fever of 100.5 F (38.1 C) or higher. SEEK IMMEDIATE MEDICAL CARE IF:   You cough up bloody sputum that had not been present before.  You develop fever of 102 F (38.9 C) or greater.  You develop worsening pain at or near the incision site. MAKE SURE YOU:   Understand these instructions.  Will watch your condition.  Will get help right away if you are not doing well or get worse. Document Released: 01/27/2007 Document Revised: 12/09/2011 Document Reviewed: 03/30/2007 ExitCare Patient Information 2014 ExitCare, [place].   ________________________________________________________________________  WHAT IS A BLOOD TRANSFUSION? Blood Transfusion Information  A transfusion is the replacement of blood or some of its parts. Blood is made up of multiple cells which provide different functions.  Red blood cells carry oxygen and are used for blood loss replacement.  White blood cells fight against infection.  Platelets control bleeding.  Plasma helps clot blood.  Other blood products are available for specialized needs, such as hemophilia or other clotting disorders. BEFORE THE TRANSFUSION  Who gives blood for transfusions?   Healthy volunteers who are fully evaluated to make sure their blood is safe. This is blood bank blood. Transfusion therapy is the safest it has ever been in the practice of medicine. Before blood is taken from a donor, a complete history is taken to make sure that person has no history of diseases nor engages in risky social behavior (examples are intravenous drug use or sexual activity with multiple partners). The donor's travel history is screened to minimize risk of transmitting infections, such as malaria. The donated blood is tested for signs of infectious  diseases, such as HIV and hepatitis. The blood is then tested to be sure it is compatible with you in order to minimize the chance of a transfusion reaction. If you or a relative donates blood, this is often done in anticipation of surgery and is  not appropriate for emergency situations. It takes many days to process the donated blood. RISKS AND COMPLICATIONS Although transfusion therapy is very safe and saves many lives, the main dangers of transfusion include:   Getting an infectious disease.  Developing a transfusion reaction. This is an allergic reaction to something in the blood you were given. Every precaution is taken to prevent this. The decision to have a blood transfusion has been considered carefully by your caregiver before blood is given. Blood is not given unless the benefits outweigh the risks. AFTER THE TRANSFUSION  Right after receiving a blood transfusion, you will usually feel much better and more energetic. This is especially true if your red blood cells have gotten low (anemic). The transfusion raises the level of the red blood cells which carry oxygen, and this usually causes an energy increase.  The nurse administering the transfusion will monitor you carefully for complications. HOME CARE INSTRUCTIONS  No special instructions are needed after a transfusion. You may find your energy is better. Speak with your caregiver about any limitations on activity for underlying diseases you may have. SEEK MEDICAL CARE IF:   Your condition is not improving after your transfusion.  You develop redness or irritation at the intravenous (IV) site. SEEK IMMEDIATE MEDICAL CARE IF:  Any of the following symptoms occur over the next 12 hours:  Shaking chills.  You have a temperature by mouth above 102 F (38.9 C), not controlled by medicine.  Chest, back, or muscle pain.  People around you feel you are not acting correctly or are confused.  Shortness of breath or difficulty  breathing.  Dizziness and fainting.  You get a rash or develop hives.  You have a decrease in urine output.  Your urine turns a dark color or changes to pink, red, or brown. Any of the following symptoms occur over the next 10 days:  You have a temperature by mouth above 102 F (38.9 C), not controlled by medicine.  Shortness of breath.  Weakness after normal activity.  The white part of the eye turns yellow (jaundice).  You have a decrease in the amount of urine or are urinating less often.  Your urine turns a dark color or changes to pink, red, or brown. Document Released: 09/13/2000 Document Revised: 12/09/2011 Document Reviewed: 05/02/2008 [facility] Patient Information 2014 [place], [place].  _______________________________________________________________________

## 2019-10-14 DIAGNOSIS — E7800 Pure hypercholesterolemia, unspecified: Secondary | ICD-10-CM | POA: Diagnosis not present

## 2019-10-14 DIAGNOSIS — E559 Vitamin D deficiency, unspecified: Secondary | ICD-10-CM | POA: Diagnosis not present

## 2019-10-14 DIAGNOSIS — I10 Essential (primary) hypertension: Secondary | ICD-10-CM | POA: Diagnosis not present

## 2019-10-14 DIAGNOSIS — Z0000 Encounter for general adult medical examination without abnormal findings: Secondary | ICD-10-CM | POA: Diagnosis not present

## 2019-10-16 ENCOUNTER — Other Ambulatory Visit (HOSPITAL_COMMUNITY)
Admission: RE | Admit: 2019-10-16 | Discharge: 2019-10-16 | Disposition: A | Discharge status: Home / Self Care | Payer: Medicare Other | Source: Ambulatory Visit | Attending: Orthopedic Surgery | Admitting: Orthopedic Surgery

## 2019-10-16 DIAGNOSIS — Z20822 Contact with and (suspected) exposure to covid-19: Secondary | ICD-10-CM | POA: Diagnosis not present

## 2019-10-16 DIAGNOSIS — Z01812 Encounter for preprocedural laboratory examination: Secondary | ICD-10-CM | POA: Diagnosis not present

## 2019-10-17 LAB — NOVEL CORONAVIRUS, NAA (HOSP ORDER, SEND-OUT TO REF LAB; TAT 18-24 HRS): SARS-CoV-2, NAA: NOT DETECTED

## 2019-10-17 NOTE — Care Plan (Signed)
Ortho Bundle Case Management Note  Patient Details  Name: AZIANA DALL MRN: [id number] Date of Birth: Aug 20, 1951    Patient plans to discharge to home with family. HHPT referral to [facility]. OPPT referral to [place]. Has equipment at home. Patient and MD in agreement with plan. Choice offered.                  DME Arranged:    DME Agency:     HH Arranged:  PT HH Agency:  [facility] (formerly [facility])  Additional Comments: Please contact me with any questions of if this plan should need to change.  Ladell Heads,  [place] [facility]  [phone] 10/17/2019, 10:25 PM

## 2019-10-18 ENCOUNTER — Encounter (HOSPITAL_COMMUNITY)
Admission: RE | Admit: 2019-10-18 | Discharge: 2019-10-18 | Disposition: A | Discharge status: Home / Self Care | Payer: Medicare Other | Source: Ambulatory Visit | Attending: Orthopedic Surgery | Admitting: Orthopedic Surgery

## 2019-10-18 ENCOUNTER — Other Ambulatory Visit: Payer: Self-pay

## 2019-10-18 ENCOUNTER — Encounter (HOSPITAL_COMMUNITY): Payer: Self-pay

## 2019-10-18 ENCOUNTER — Other Ambulatory Visit (HOSPITAL_COMMUNITY): Payer: Medicare Other

## 2019-10-18 DIAGNOSIS — M1712 Unilateral primary osteoarthritis, left knee: Secondary | ICD-10-CM | POA: Diagnosis not present

## 2019-10-18 DIAGNOSIS — Z01812 Encounter for preprocedural laboratory examination: Secondary | ICD-10-CM | POA: Insufficient documentation

## 2019-10-18 HISTORY — DX: Raynaud's syndrome without gangrene: I73.00

## 2019-10-18 LAB — CBC WITH DIFFERENTIAL/PLATELET
Abs Immature Granulocytes: 0.02 10*3/uL (ref 0.00–0.07)
Basophils Absolute: 0.1 10*3/uL (ref 0.0–0.1)
Basophils Relative: 1 %
Eosinophils Absolute: 0.2 10*3/uL (ref 0.0–0.5)
Eosinophils Relative: 3 %
HCT: 41.9 % (ref 36.0–46.0)
Hemoglobin: 13.2 g/dL (ref 12.0–15.0)
Immature Granulocytes: 0 %
Lymphocytes Relative: 33 %
Lymphs Abs: 2.5 10*3/uL (ref 0.7–4.0)
MCH: 27 pg (ref 26.0–34.0)
MCHC: 31.5 g/dL (ref 30.0–36.0)
MCV: 85.7 fL (ref 80.0–100.0)
Monocytes Absolute: 0.6 10*3/uL (ref 0.1–1.0)
Monocytes Relative: 8 %
Neutro Abs: 4.2 10*3/uL (ref 1.7–7.7)
Neutrophils Relative %: 55 %
Platelets: 291 10*3/uL (ref 150–400)
RBC: 4.89 MIL/uL (ref 3.87–5.11)
RDW: 14.4 % (ref 11.5–15.5)
WBC: 7.6 10*3/uL (ref 4.0–10.5)
nRBC: 0 % (ref 0.0–0.2)

## 2019-10-18 LAB — BASIC METABOLIC PANEL
Anion gap: 10 (ref 5–15)
BUN: 14 mg/dL (ref 8–23)
CO2: 24 mmol/L (ref 22–32)
Calcium: 9.3 mg/dL (ref 8.9–10.3)
Chloride: 102 mmol/L (ref 98–111)
Creatinine, Ser: 0.75 mg/dL (ref 0.44–1.00)
GFR calc Af Amer: >60 mL/min (ref >60)
GFR calc non Af Amer: >60 mL/min (ref >60)
Glucose, Bld: 118 mg/dL — ABNORMAL HIGH (ref 70–99)
Potassium: 4.5 mmol/L (ref 3.5–5.1)
Sodium: 136 mmol/L (ref 135–145)

## 2019-10-18 LAB — URINALYSIS, ROUTINE W REFLEX MICROSCOPIC
Bilirubin Urine: NEGATIVE
Glucose, UA: NEGATIVE mg/dL
Hgb urine dipstick: NEGATIVE
Ketones, ur: NEGATIVE mg/dL
Leukocytes,Ua: NEGATIVE
Nitrite: NEGATIVE
Protein, ur: NEGATIVE mg/dL
Specific Gravity, Urine: 1.024 (ref 1.005–1.030)
pH: 6 (ref 5.0–8.0)

## 2019-10-18 LAB — APTT: aPTT: 28 seconds (ref 24–36)

## 2019-10-18 LAB — PROTIME-INR
INR: 1 (ref 0.8–1.2)
Prothrombin Time: 13.2 seconds (ref 11.4–15.2)

## 2019-10-18 LAB — SURGICAL PCR SCREEN
MRSA, PCR: NEGATIVE
Staphylococcus aureus: NEGATIVE

## 2019-10-18 NOTE — Progress Notes (Signed)
PCP - Dr. Beckie Salts Cardiologist - Dr. Roni Bread Last office visit 09/05/2019, cardiac clearance note 10/08/19 in telephone encounter  Chest x-ray - 07/05/2019 in epic EKG - 09/02/2019 in epic Stress Test - N/A ECHO - 05/24/2019 in epic Cardiac Cath - 05/24/2019 in epic  Sleep Study - N/A CPAP - N/A  Fasting Blood Sugar - N/A Checks Blood Sugar _N/A____ times a day  Blood Thinner Instructions:  N/A Aspirin Instructions:  Instructed to contact Dr. Damita Dunnings office Last Dose:  Currently still taking  Anesthesia review: Aortic valve sclerosis, SVT, Demand Ischemia, CAD, HTN  Patient denies shortness of breath, fever, cough and chest pain at PAT appointment   Patient verbalized understanding of instructions that were given to them at the PAT appointment. Patient was also instructed that they will need to review over the PAT instructions again at home before surgery.

## 2019-10-18 NOTE — Progress Notes (Signed)
Anesthesia Chart Review   Case: [id number] Date/Time: 10/20/19 1245   Procedure: LEFT TOTAL KNEE ARTHROPLASTY (Left Knee)   Anesthesia type: Spinal   Pre-op diagnosis: LEFT KNEE OSTEOARTHRITIS   Location: [place] [facility] / [facility]   Surgeons: Frederik Pear, MD      DISCUSSION:69 y.o. never smoker with h/o GERD, HTN, Raynaud's, CAD, left knee OA scheduled for above procedure 10/20/19 with Dr. Frederik Pear.   Pt cleared by cardiology 10/08/2019.  Per Roby Lofts, PA-C, "Patient was contacted 10/08/2019 in reference to pre-operative risk assessment for pending surgery as outlined below.  Cynthia [name] was last seen on 09/02/2019 by Dr. Ellyn Hack.  Since that day, Cynthia [name] has done well from a cardiac standpoint. She had a right knee replacement in the fall which has improved her mobility. She can complete 4 METs without anginal complaints.   Therefore, based on ACC/AHA guidelines, the patient would be at acceptable risk for the planned procedure without further cardiovascular testing.  If needed, patient can hold aspirin 7 days prior to surgery and restart when cleared to do so by her orthopedist. "  Anticipate pt can proceed with planned procedure barring acute status change.   VS: There were no vitals taken for this visit.  PROVIDERS: Maurice Small, MD is PCP   Glenetta Hew, MD is Cardiologist  LABS: Labs reviewed: Acceptable for surgery. (all labs ordered are listed, but only abnormal results are displayed)  Labs Reviewed - No data to display   IMAGES: Chest Xray 07/05/2019 FINDINGS: The heart size and mediastinal contours are within normal limits. Both lungs are clear. Degenerative changes present in the spine.  IMPRESSION: No active cardiopulmonary disease.  EKG: 09/02/2019 Rate 84 bpm Normal sinus rhythm  Nonspecific ST abnormality   CV: Echo 05/24/2019 IMPRESSIONS    1. The left ventricle has normal systolic function, with an ejection fraction of 55-60%. The  cavity size was normal. Left ventricular diastolic parameters were normal. No evidence of left ventricular regional wall motion abnormalities.  2. The aortic valve is tricuspid. Moderate sclerosis of the aortic valve.  3. The aorta is normal unless otherwise noted.  Cardiac Cath 05/24/2019 Conclusions: 1. Moderate to severe, non-critical single-vessel coronary artery disease with 60-70% proximal LAD stenosis at origin of large diagonal branch ([place] 1,0,0). 2. Low normal left ventricular systolic function with subtle anterior hypokinesis. 3. Mildly elevated left ventricular filling pressure.  Recommendations: 1. Medical therapy for coronary artery disease and supraventricular tachycardia.  I suspect chest pain and elevated HS-TnI are manifestations of demand ischemia.  Of note, inferior ST elevation had resolved by the time the patient arrived in the cath lab based on EMS EKG and chest pain had abated by the end of the case. 2. If patient has recurrent angina, consider functional study to assess hemodynamic significance of proximal LAD stenosis. 3. Obtain transthoracic echocardiogram to confirm left ventricular systolic function and to exclude other structural abnormalities. 4. Consider electrophysiology consultation if recurrent SVT is noted despite medical therapy. Past Medical History:  Diagnosis Date  . Anemia   . Anxiety disorder   . Aortic valve sclerosis 05/24/2019   Moderate noted on ECHO  . Arthritis    right knee  . Cancer of the skin, basal cell   . Coronary artery disease    Moderate to severe, non-critical single-vessel coronary artery disease with 60-70% proximal LAD stenosis at origin of large diagonal branch ([place] 1,0,0).  . Demand ischemia ([place]) 05/24/2019  . Depression   .  GERD (gastroesophageal reflux disease)    TUMS  . Hypertension   . Lateral meniscus derangement, right   . Raynaud's phenomenon    questionable because hands get white when cold  . SVT  (supraventricular tachycardia) (HCC)     Past Surgical History:  Procedure Laterality Date  . CARDIOVERSION  05/24/2019   Adenosine  . CATARACT EXTRACTION, BILATERAL    . CESAREAN SECTION     x3  . CHONDROPLASTY Right 02/21/2017   Procedure: CHONDROPLASTY;  Surgeon: Melrose Nakayama, MD;  Location: [place];  Service: Orthopedics;  Laterality: Right;  . COLONOSCOPY    . KNEE ARTHROSCOPY WITH LATERAL MENISECTOMY Right 02/21/2017   Procedure: KNEE ARTHROSCOPY WITH LATERAL MENISECTOMY;  Surgeon: Melrose Nakayama, MD;  Location: [place];  Service: Orthopedics;  Laterality: Right;  . KNEE ARTHROSCOPY WITH MEDIAL MENISECTOMY Right 02/21/2017   Procedure: KNEE ARTHROSCOPY WITH MEDIAL MENISECTOMY;  Surgeon: Melrose Nakayama, MD;  Location: [place];  Service: Orthopedics;  Laterality: Right;  . LEFT HEART CATH AND CORONARY ANGIOGRAPHY N/A 05/24/2019   Procedure: LEFT HEART CATH AND CORONARY ANGIOGRAPHY;  Surgeon: Nelva Bush, MD;  Location: [place] [facility];  Service: Cardiovascular;  Laterality: N/A;  . MOUTH SURGERY    . TOTAL KNEE ARTHROPLASTY Right 07/05/2019   Procedure: RIGHT TOTAL KNEE ARTHROPLASTY;  Surgeon: Frederik Pear, MD;  Location: [facility];  Service: Orthopedics;  Laterality: Right;  . Yag Capsulotomy Left 06/30/2019    MEDICATIONS: No current facility-administered medications for this encounter.   Marland Kitchen alendronate (FOSAMAX) 70 MG tablet  . ALPRAZolam (XANAX) 0.5 MG tablet  . aspirin EC 81 MG tablet  . atorvastatin (LIPITOR) 40 MG tablet  . buPROPion (WELLBUTRIN XL) 300 MG 24 hr tablet  . Calcium Carbonate-Vitamin D (CALTRATE 600+D PO)  . cholecalciferol (VITAMIN D3) 25 MCG (1000 UNIT) tablet  . ferrous sulfate 325 (65 FE) MG tablet  . metoprolol succinate (TOPROL-XL) 50 MG 24 hr tablet  . Multiple Vitamins-Minerals (MULTIVITAMIN ADULT PO)  . nitrofurantoin (MACRODANTIN) 100 MG capsule  . nitroGLYCERIN (NITROSTAT) 0.4 MG SL  tablet  . nystatin-triamcinolone (MYCOLOG II) cream  . sertraline (ZOLOFT) 100 MG tablet    Maia Plan [facility] Pre-Surgical Testing [phone] 10/18/19  4:10 PM

## 2019-10-19 DIAGNOSIS — M1712 Unilateral primary osteoarthritis, left knee: Secondary | ICD-10-CM | POA: Diagnosis present

## 2019-10-19 MED ORDER — TRANEXAMIC ACID 1000 MG/10ML IV SOLN
2000.0000 mg | INTRAVENOUS | Status: DC
  Filled 2019-10-19: qty 20

## 2019-10-19 MED ORDER — BUPIVACAINE LIPOSOME 1.3 % IJ SUSP
20.0000 mL | INTRAMUSCULAR | Status: DC
  Filled 2019-10-19: qty 20

## 2019-10-19 NOTE — H&P (Signed)
TOTAL KNEE ADMISSION H&P  Patient is being admitted for left total knee arthroplasty.  Subjective:  Chief Complaint:left knee pain.  HPI: Cynthia [name], 69 y.o. female, has a history of pain and functional disability in the left knee due to arthritis and has failed non-surgical conservative treatments for greater than 12 weeks to includeNSAID's and/or analgesics, corticosteriod injections, flexibility and strengthening excercises, use of assistive devices, weight reduction as appropriate and activity modification.  Onset of symptoms was gradual, starting several years ago with gradually worsening course since that time. The patient noted no past surgery on the left knee(s).  Patient currently rates pain in the left knee(s) at 10 out of 10 with activity. Patient has night pain, worsening of pain with activity and weight bearing, pain that interferes with activities of daily living, pain with passive range of motion, crepitus and joint swelling.  Patient has evidence of subchondral cysts, subchondral sclerosis, periarticular osteophytes and joint space narrowing by imaging studies.  There is no active infection.  Patient Active Problem List   Diagnosis Date Noted  . Degenerative arthritis of left knee 10/19/2019  . Osteoarthritis of left knee 09/05/2019  . Hyperlipidemia with target LDL less than 70 09/02/2019  . S/P total knee replacement using cement, left 07/05/2019  . Osteoarthritis of right knee 07/02/2019  . Anxiety 05/27/2019  . Candidal vulvovaginitis 05/27/2019  . Candidiasis of skin 05/27/2019  . Chronic cystitis 05/27/2019  . Essential hypertension 05/27/2019  . Musculoskeletal pain 05/27/2019  . Obesity with body mass index 30 or greater 05/27/2019  . Overactive bladder 05/27/2019  . Demand ischemia ([place]) 05/25/2019  . Coronary artery disease, 60% LAD prior to D1 05/25/2019  . SVT (supraventricular tachycardia) ([place]) 05/24/2019   Past Medical History:  Diagnosis Date  .  Anemia   . Anxiety disorder   . Aortic valve sclerosis 05/24/2019   Moderate noted on ECHO  . Arthritis    right knee  . Cancer of the skin, basal cell   . Coronary artery disease    Moderate to severe, non-critical single-vessel coronary artery disease with 60-70% proximal LAD stenosis at origin of large diagonal branch ([place] 1,0,0).  . Demand ischemia ([place]) 05/24/2019  . Depression   . GERD (gastroesophageal reflux disease)    TUMS  . Hypertension   . Lateral meniscus derangement, right   . Raynaud's phenomenon    questionable because hands get white when cold  . SVT (supraventricular tachycardia) ([facility])     Past Surgical History:  Procedure Laterality Date  . CARDIOVERSION  05/24/2019   Adenosine  . CATARACT EXTRACTION, BILATERAL    . CESAREAN SECTION     x3  . CHONDROPLASTY Right 02/21/2017   Procedure: CHONDROPLASTY;  Surgeon: Melrose Nakayama, MD;  Location: [facility];  Service: Orthopedics;  Laterality: Right;  . COLONOSCOPY    . KNEE ARTHROSCOPY WITH LATERAL MENISECTOMY Right 02/21/2017   Procedure: KNEE ARTHROSCOPY WITH LATERAL MENISECTOMY;  Surgeon: Melrose Nakayama, MD;  Location: [facility];  Service: Orthopedics;  Laterality: Right;  . KNEE ARTHROSCOPY WITH MEDIAL MENISECTOMY Right 02/21/2017   Procedure: KNEE ARTHROSCOPY WITH MEDIAL MENISECTOMY;  Surgeon: Melrose Nakayama, MD;  Location: [facility];  Service: Orthopedics;  Laterality: Right;  . LEFT HEART CATH AND CORONARY ANGIOGRAPHY N/A 05/24/2019   Procedure: LEFT HEART CATH AND CORONARY ANGIOGRAPHY;  Surgeon: Nelva Bush, MD;  Location: [facility];  Service: Cardiovascular;  Laterality: N/A;  . MOUTH SURGERY    .  TOTAL KNEE ARTHROPLASTY Right 07/05/2019   Procedure: RIGHT TOTAL KNEE ARTHROPLASTY;  Surgeon: Frederik Pear, MD;  Location: [facility];  Service: Orthopedics;  Laterality: Right;  . Yag Capsulotomy Left 06/30/2019    Current Facility-Administered  Medications  Medication Dose Route Frequency Provider Last Rate Last Admin  . [START ON 10/20/2019] bupivacaine liposome (EXPAREL) 1.3 % injection 266 mg  20 mL Other On Call to OR Frederik Pear, MD      . Derrill Memo ON 10/20/2019] tranexamic acid (CYKLOKAPRON) 2,000 mg in sodium chloride 0.9 % 50 mL Topical Application  [id number] mg Topical On Call to OR Frederik Pear, MD       Current Outpatient Medications  Medication Sig Dispense Refill Last Dose  . alendronate (FOSAMAX) 70 MG tablet Take 70 mg by mouth once a week.      . ALPRAZolam (XANAX) 0.5 MG tablet Take 0.25 mg by mouth at bedtime as needed for anxiety.      Marland Kitchen aspirin EC 81 MG tablet Take 1 tablet (81 mg total) by mouth daily. 30 tablet 11   . atorvastatin (LIPITOR) 40 MG tablet Take 1 tablet (40 mg total) by mouth daily at 6 PM. 30 tablet 6   . buPROPion (WELLBUTRIN XL) 300 MG 24 hr tablet Take 300 mg by mouth daily.     . Calcium Carbonate-Vitamin D (CALTRATE 600+D PO) Take 1 tablet by mouth daily.     . cholecalciferol (VITAMIN D3) 25 MCG (1000 UNIT) tablet Take 1,000 Units by mouth daily.     . ferrous sulfate 325 (65 FE) MG tablet Take 325 mg by mouth daily with breakfast.     . metoprolol succinate (TOPROL-XL) 50 MG 24 hr tablet Take 1 tablet (50 mg total) by mouth daily. Take with or immediately following a meal. 90 tablet 3   . Multiple Vitamins-Minerals (MULTIVITAMIN ADULT PO) Take 1 tablet by mouth daily.     . nitrofurantoin (MACRODANTIN) 100 MG capsule Take 100 mg by mouth daily as needed (UTI).      . nitroGLYCERIN (NITROSTAT) 0.4 MG SL tablet Place 1 tablet (0.4 mg total) under the tongue every 5 (five) minutes x 3 doses as needed for chest pain. 25 tablet 12   . nystatin-triamcinolone (MYCOLOG II) cream Apply 1 application topically daily as needed for rash.     . sertraline (ZOLOFT) 100 MG tablet Take 100 mg by mouth daily.       Allergies  Allergen Reactions  . Penicillins Rash    Pt does not remember, states she was real  young    Social History   Tobacco Use  . Smoking status: Never Smoker  . Smokeless tobacco: Never Used  Substance Use Topics  . Alcohol use: Not Currently    Comment: social    Family History  Problem Relation Age of Onset  . Hypertension Father      Review of Systems  Constitutional: Negative.   HENT: Negative.   Eyes: Negative.        Glasses  Respiratory: Negative.   Cardiovascular:       HTN  Gastrointestinal: Negative.   Endocrine: Negative.   Genitourinary: Negative.   Musculoskeletal: Positive for arthralgias and myalgias.  Allergic/Immunologic: Negative.   Neurological: Negative.   Psychiatric/Behavioral:       Depression    Objective:  Physical Exam  Constitutional: She is oriented to person, place, and time. She appears well-developed and well-nourished.  HENT:  Head: Normocephalic and atraumatic.  Eyes: Pupils are  equal, round, and reactive to light.  Cardiovascular: Intact distal pulses.  Respiratory: Effort normal.  Musculoskeletal:        General: Tenderness present.     Cervical back: Normal range of motion and neck supple.     Comments: left knee does reveal pain over lateral joint line with decreased AROM and pain with knee flexion.  She is neurovascularly intact distally bilaterally.  Neurological: She is oriented to person, place, and time.  Skin: Skin is dry.  Psychiatric: She has a normal mood and affect. Her behavior is normal. Judgment and thought content normal.    Vital signs in last 24 hours:    Labs:   Estimated body mass index is 38.27 kg/m as calculated from the following:   Height as of 10/18/19: 5\' 5"  (1.651 m).   Weight as of 10/18/19: 104.3 kg.   Imaging Review Plain radiographs demonstrate  End-stage osteoarthritis of the left knee with large subchondral cyst at lateral femoral condyle   Assessment/Plan:  End stage arthritis, left knee   The patient history, physical examination, clinical judgment of the provider  and imaging studies are consistent with end stage degenerative joint disease of the left knee(s) and total knee arthroplasty is deemed medically necessary. The treatment options including medical management, injection therapy arthroscopy and arthroplasty were discussed at length. The risks and benefits of total knee arthroplasty were presented and reviewed. The risks due to aseptic loosening, infection, stiffness, patella tracking problems, thromboembolic complications and other imponderables were discussed. The patient acknowledged the explanation, agreed to proceed with the plan and consent was signed. Patient is being admitted for inpatient treatment for surgery, pain control, PT, OT, prophylactic antibiotics, VTE prophylaxis, progressive ambulation and ADL's and discharge planning. The patient is planning to be discharged home with home health services     Patient's anticipated LOS is less than 2 midnights, meeting these requirements: - Younger than 42 - Lives within 1 hour of care - Has a competent adult at home to recover with post-op recover - NO history of  - Chronic pain requiring opiods  - Diabetes  - Coronary Artery Disease  - Heart failure  - Heart attack  - Stroke  - DVT/VTE  - Cardiac arrhythmia  - Respiratory Failure/COPD  - Renal failure  - Anemia  - Advanced Liver disease

## 2019-10-20 ENCOUNTER — Ambulatory Visit (HOSPITAL_COMMUNITY): Payer: Medicare Other | Admitting: Physician Assistant

## 2019-10-20 ENCOUNTER — Observation Stay (HOSPITAL_COMMUNITY)
Admission: RE | Admit: 2019-10-20 | Discharge: 2019-10-21 | Disposition: A | Discharge status: Home / Self Care | Payer: Medicare Other | Attending: Orthopedic Surgery | Admitting: Orthopedic Surgery

## 2019-10-20 ENCOUNTER — Other Ambulatory Visit: Payer: Self-pay

## 2019-10-20 ENCOUNTER — Encounter (HOSPITAL_COMMUNITY)
Admission: RE | Disposition: A | Discharge status: Home / Self Care | Payer: Self-pay | Source: Home / Self Care | Attending: Orthopedic Surgery

## 2019-10-20 ENCOUNTER — Encounter (HOSPITAL_COMMUNITY): Payer: Self-pay | Admitting: Orthopedic Surgery

## 2019-10-20 DIAGNOSIS — Z79899 Other long term (current) drug therapy: Secondary | ICD-10-CM | POA: Insufficient documentation

## 2019-10-20 DIAGNOSIS — M21062 Valgus deformity, not elsewhere classified, left knee: Secondary | ICD-10-CM | POA: Diagnosis not present

## 2019-10-20 DIAGNOSIS — M1712 Unilateral primary osteoarthritis, left knee: Principal | ICD-10-CM | POA: Insufficient documentation

## 2019-10-20 DIAGNOSIS — F419 Anxiety disorder, unspecified: Secondary | ICD-10-CM | POA: Diagnosis not present

## 2019-10-20 DIAGNOSIS — F329 Major depressive disorder, single episode, unspecified: Secondary | ICD-10-CM | POA: Diagnosis not present

## 2019-10-20 DIAGNOSIS — Z96652 Presence of left artificial knee joint: Secondary | ICD-10-CM

## 2019-10-20 DIAGNOSIS — Z6838 Body mass index (BMI) 38.0-38.9, adult: Secondary | ICD-10-CM | POA: Insufficient documentation

## 2019-10-20 DIAGNOSIS — K219 Gastro-esophageal reflux disease without esophagitis: Secondary | ICD-10-CM | POA: Insufficient documentation

## 2019-10-20 DIAGNOSIS — E669 Obesity, unspecified: Secondary | ICD-10-CM | POA: Insufficient documentation

## 2019-10-20 DIAGNOSIS — I10 Essential (primary) hypertension: Secondary | ICD-10-CM | POA: Insufficient documentation

## 2019-10-20 DIAGNOSIS — E785 Hyperlipidemia, unspecified: Secondary | ICD-10-CM | POA: Diagnosis not present

## 2019-10-20 DIAGNOSIS — Z96651 Presence of right artificial knee joint: Secondary | ICD-10-CM | POA: Insufficient documentation

## 2019-10-20 DIAGNOSIS — G8918 Other acute postprocedural pain: Secondary | ICD-10-CM | POA: Diagnosis not present

## 2019-10-20 DIAGNOSIS — I2510 Atherosclerotic heart disease of native coronary artery without angina pectoris: Secondary | ICD-10-CM | POA: Diagnosis not present

## 2019-10-20 DIAGNOSIS — Z7982 Long term (current) use of aspirin: Secondary | ICD-10-CM | POA: Insufficient documentation

## 2019-10-20 HISTORY — PX: TOTAL KNEE ARTHROPLASTY: SHX125

## 2019-10-20 LAB — TYPE AND SCREEN
ABO/RH(D): A NEG
Antibody Screen: NEGATIVE

## 2019-10-20 SURGERY — ARTHROPLASTY, KNEE, TOTAL
Anesthesia: Spinal | Site: Knee | Laterality: Left

## 2019-10-20 MED ORDER — METHOCARBAMOL 500 MG PO TABS
500.0000 mg | ORAL_TABLET | Freq: Four times a day (QID) | ORAL | Status: DC | PRN
  Administered 2019-10-21 (×2): 500 mg via ORAL
  Filled 2019-10-20 (×2): qty 1

## 2019-10-20 MED ORDER — ONDANSETRON HCL 4 MG/2ML IJ SOLN: 4.0000 mg | Freq: Four times a day (QID) | INTRAMUSCULAR | Status: DC | PRN

## 2019-10-20 MED ORDER — FENTANYL CITRATE (PF) 100 MCG/2ML IJ SOLN
50.0000 ug | Freq: Once | INTRAMUSCULAR | Status: AC
  Administered 2019-10-20: 100 ug via INTRAVENOUS
  Filled 2019-10-20: qty 2

## 2019-10-20 MED ORDER — PANTOPRAZOLE SODIUM 40 MG PO TBEC
40.0000 mg | DELAYED_RELEASE_TABLET | Freq: Every day | ORAL | Status: DC
  Administered 2019-10-20 – 2019-10-21 (×2): 40 mg via ORAL
  Filled 2019-10-20 (×2): qty 1

## 2019-10-20 MED ORDER — ASPIRIN 81 MG PO CHEW
81.0000 mg | CHEWABLE_TABLET | Freq: Two times a day (BID) | ORAL | Status: DC
  Administered 2019-10-20 – 2019-10-21 (×2): 81 mg via ORAL
  Filled 2019-10-20 (×2): qty 1

## 2019-10-20 MED ORDER — DEXAMETHASONE SODIUM PHOSPHATE 10 MG/ML IJ SOLN
INTRAMUSCULAR | Status: AC
  Filled 2019-10-20: qty 1

## 2019-10-20 MED ORDER — ACETAMINOPHEN 325 MG PO TABS: 325.0000 mg | ORAL_TABLET | Freq: Four times a day (QID) | ORAL | Status: DC | PRN

## 2019-10-20 MED ORDER — DIPHENHYDRAMINE HCL 12.5 MG/5ML PO ELIX: 12.5000 mg | ORAL_SOLUTION | ORAL | Status: DC | PRN

## 2019-10-20 MED ORDER — METOPROLOL SUCCINATE ER 50 MG PO TB24
50.0000 mg | ORAL_TABLET | Freq: Every day | ORAL | Status: DC
  Administered 2019-10-21: 50 mg via ORAL
  Filled 2019-10-20: qty 1

## 2019-10-20 MED ORDER — SERTRALINE HCL 50 MG PO TABS
100.0000 mg | ORAL_TABLET | Freq: Every day | ORAL | Status: DC
  Administered 2019-10-21: 100 mg via ORAL
  Filled 2019-10-20: qty 2

## 2019-10-20 MED ORDER — FENTANYL CITRATE (PF) 100 MCG/2ML IJ SOLN
INTRAMUSCULAR | Status: AC
  Administered 2019-10-20: 50 ug via INTRAVENOUS
  Filled 2019-10-20: qty 2

## 2019-10-20 MED ORDER — DEXAMETHASONE SODIUM PHOSPHATE 10 MG/ML IJ SOLN
INTRAMUSCULAR | Status: DC | PRN
  Administered 2019-10-20: 10 mg via INTRAVENOUS

## 2019-10-20 MED ORDER — SODIUM CHLORIDE (PF) 0.9 % IJ SOLN
INTRAMUSCULAR | Status: AC
  Filled 2019-10-20: qty 100

## 2019-10-20 MED ORDER — BUPIVACAINE HCL (PF) 0.25 % IJ SOLN
INTRAMUSCULAR | Status: DC | PRN
  Administered 2019-10-20: 30 mL

## 2019-10-20 MED ORDER — SODIUM CHLORIDE (PF) 0.9 % IJ SOLN
INTRAMUSCULAR | Status: DC | PRN
  Administered 2019-10-20: 70 mL

## 2019-10-20 MED ORDER — LACTATED RINGERS IV BOLUS: 250.0000 mL | Freq: Once | INTRAVENOUS | Status: DC

## 2019-10-20 MED ORDER — CHLORHEXIDINE GLUCONATE 4 % EX LIQD
60.0000 mL | Freq: Once | CUTANEOUS | Status: AC
  Administered 2019-10-20: 4 via TOPICAL

## 2019-10-20 MED ORDER — NITROFURANTOIN MACROCRYSTAL 100 MG PO CAPS
100.0000 mg | ORAL_CAPSULE | Freq: Every day | ORAL | Status: DC | PRN
  Filled 2019-10-20: qty 1

## 2019-10-20 MED ORDER — TRANEXAMIC ACID-NACL 1000-0.7 MG/100ML-% IV SOLN
1000.0000 mg | INTRAVENOUS | Status: AC
  Administered 2019-10-20: 1000 mg via INTRAVENOUS
  Filled 2019-10-20: qty 100

## 2019-10-20 MED ORDER — OXYCODONE HCL 5 MG/5ML PO SOLN: 5.0000 mg | Freq: Once | ORAL | Status: DC | PRN

## 2019-10-20 MED ORDER — METOCLOPRAMIDE HCL 5 MG PO TABS: 5.0000 mg | ORAL_TABLET | Freq: Three times a day (TID) | ORAL | Status: DC | PRN

## 2019-10-20 MED ORDER — FLEET ENEMA 7-19 GM/118ML RE ENEM: 1.0000 | ENEMA | Freq: Once | RECTAL | Status: DC | PRN

## 2019-10-20 MED ORDER — HYDROMORPHONE HCL 1 MG/ML IJ SOLN
0.5000 mg | INTRAMUSCULAR | Status: DC | PRN
  Filled 2019-10-20: qty 1

## 2019-10-20 MED ORDER — MIDAZOLAM HCL 2 MG/2ML IJ SOLN
1.0000 mg | Freq: Once | INTRAMUSCULAR | Status: AC
  Administered 2019-10-20: 2 mg via INTRAVENOUS
  Filled 2019-10-20: qty 2

## 2019-10-20 MED ORDER — ASPIRIN EC 81 MG PO TBEC: 81.0000 mg | DELAYED_RELEASE_TABLET | Freq: Two times a day (BID) | ORAL | 0 refills | Status: DC

## 2019-10-20 MED ORDER — MENTHOL 3 MG MT LOZG: 1.0000 | LOZENGE | OROMUCOSAL | Status: DC | PRN

## 2019-10-20 MED ORDER — KCL IN DEXTROSE-NACL 20-5-0.45 MEQ/L-%-% IV SOLN
INTRAVENOUS | Status: DC
  Filled 2019-10-20 (×2): qty 1000

## 2019-10-20 MED ORDER — DEXAMETHASONE SODIUM PHOSPHATE 10 MG/ML IJ SOLN
10.0000 mg | Freq: Once | INTRAMUSCULAR | Status: AC
  Administered 2019-10-21: 10 mg via INTRAVENOUS
  Filled 2019-10-20: qty 1

## 2019-10-20 MED ORDER — BISACODYL 5 MG PO TBEC: 5.0000 mg | DELAYED_RELEASE_TABLET | Freq: Every day | ORAL | Status: DC | PRN

## 2019-10-20 MED ORDER — OXYCODONE-ACETAMINOPHEN 5-325 MG PO TABS: 1.0000 | ORAL_TABLET | ORAL | 0 refills | Status: DC | PRN

## 2019-10-20 MED ORDER — PHENOL 1.4 % MT LIQD: 1.0000 | OROMUCOSAL | Status: DC | PRN

## 2019-10-20 MED ORDER — FENTANYL CITRATE (PF) 100 MCG/2ML IJ SOLN
INTRAMUSCULAR | Status: DC | PRN
  Administered 2019-10-20: 50 ug via INTRAVENOUS

## 2019-10-20 MED ORDER — LACTATED RINGERS IV SOLN: INTRAVENOUS | Status: DC

## 2019-10-20 MED ORDER — BUPIVACAINE LIPOSOME 1.3 % IJ SUSP
INTRAMUSCULAR | Status: DC | PRN
  Administered 2019-10-20: 20 mL

## 2019-10-20 MED ORDER — TRANEXAMIC ACID 1000 MG/10ML IV SOLN
INTRAVENOUS | Status: DC | PRN
  Administered 2019-10-20: 2000 mg via TOPICAL

## 2019-10-20 MED ORDER — ALUM & MAG HYDROXIDE-SIMETH 200-200-20 MG/5ML PO SUSP: 30.0000 mL | ORAL | Status: DC | PRN

## 2019-10-20 MED ORDER — METHOCARBAMOL 500 MG IVPB - SIMPLE MED
500.0000 mg | Freq: Four times a day (QID) | INTRAVENOUS | Status: DC | PRN
  Filled 2019-10-20: qty 50

## 2019-10-20 MED ORDER — TIZANIDINE HCL 2 MG PO TABS: 2.0000 mg | ORAL_TABLET | Freq: Four times a day (QID) | ORAL | 0 refills | Status: DC | PRN

## 2019-10-20 MED ORDER — ACETAMINOPHEN 325 MG PO TABS: 325.0000 mg | ORAL_TABLET | ORAL | Status: DC | PRN

## 2019-10-20 MED ORDER — ONDANSETRON HCL 4 MG/2ML IJ SOLN: 4.0000 mg | Freq: Once | INTRAMUSCULAR | Status: DC | PRN

## 2019-10-20 MED ORDER — GABAPENTIN 100 MG PO CAPS
100.0000 mg | ORAL_CAPSULE | Freq: Three times a day (TID) | ORAL | Status: DC
  Administered 2019-10-20 – 2019-10-21 (×2): 100 mg via ORAL
  Filled 2019-10-20 (×2): qty 1

## 2019-10-20 MED ORDER — ONDANSETRON HCL 4 MG/2ML IJ SOLN
INTRAMUSCULAR | Status: DC | PRN
  Administered 2019-10-20: 4 mg via INTRAVENOUS

## 2019-10-20 MED ORDER — TRANEXAMIC ACID-NACL 1000-0.7 MG/100ML-% IV SOLN
1000.0000 mg | Freq: Once | INTRAVENOUS | Status: AC
  Administered 2019-10-20: 1000 mg via INTRAVENOUS
  Filled 2019-10-20: qty 100

## 2019-10-20 MED ORDER — ALPRAZOLAM 0.25 MG PO TABS: 0.2500 mg | ORAL_TABLET | Freq: Every evening | ORAL | Status: DC | PRN

## 2019-10-20 MED ORDER — POVIDONE-IODINE 10 % EX SWAB
2.0000 "application " | Freq: Once | CUTANEOUS | Status: AC
  Administered 2019-10-20: 2 via TOPICAL

## 2019-10-20 MED ORDER — POLYETHYLENE GLYCOL 3350 17 G PO PACK: 17.0000 g | PACK | Freq: Every day | ORAL | Status: DC | PRN

## 2019-10-20 MED ORDER — DOCUSATE SODIUM 100 MG PO CAPS
100.0000 mg | ORAL_CAPSULE | Freq: Two times a day (BID) | ORAL | Status: DC
  Administered 2019-10-20 – 2019-10-21 (×2): 100 mg via ORAL
  Filled 2019-10-20 (×2): qty 1

## 2019-10-20 MED ORDER — SODIUM CHLORIDE (PF) 0.9 % IJ SOLN
INTRAMUSCULAR | Status: AC
  Filled 2019-10-20: qty 50

## 2019-10-20 MED ORDER — PROPOFOL 500 MG/50ML IV EMUL
INTRAVENOUS | Status: AC
  Filled 2019-10-20: qty 50

## 2019-10-20 MED ORDER — OXYCODONE HCL 5 MG PO TABS: 5.0000 mg | ORAL_TABLET | Freq: Once | ORAL | Status: DC | PRN

## 2019-10-20 MED ORDER — PROPOFOL 500 MG/50ML IV EMUL
INTRAVENOUS | Status: DC | PRN
  Administered 2019-10-20: 75 ug/kg/min via INTRAVENOUS

## 2019-10-20 MED ORDER — SODIUM CHLORIDE 0.9 % IR SOLN
Status: DC | PRN
  Administered 2019-10-20: 1000 mL

## 2019-10-20 MED ORDER — WATER FOR IRRIGATION, STERILE IR SOLN
Status: DC | PRN
  Administered 2019-10-20: 2000 mL

## 2019-10-20 MED ORDER — METOCLOPRAMIDE HCL 5 MG/ML IJ SOLN: 5.0000 mg | Freq: Three times a day (TID) | INTRAMUSCULAR | Status: DC | PRN

## 2019-10-20 MED ORDER — MIDAZOLAM HCL 5 MG/5ML IJ SOLN
INTRAMUSCULAR | Status: DC | PRN
  Administered 2019-10-20: 2 mg via INTRAVENOUS

## 2019-10-20 MED ORDER — ONDANSETRON HCL 4 MG/2ML IJ SOLN
INTRAMUSCULAR | Status: AC
  Filled 2019-10-20: qty 2

## 2019-10-20 MED ORDER — MIDAZOLAM HCL 2 MG/2ML IJ SOLN
INTRAMUSCULAR | Status: AC
  Filled 2019-10-20: qty 2

## 2019-10-20 MED ORDER — ONDANSETRON HCL 4 MG PO TABS: 4.0000 mg | ORAL_TABLET | Freq: Four times a day (QID) | ORAL | Status: DC | PRN

## 2019-10-20 MED ORDER — HYDROMORPHONE HCL 2 MG/ML IJ SOLN
INTRAMUSCULAR | Status: AC
  Filled 2019-10-20: qty 1

## 2019-10-20 MED ORDER — LACTATED RINGERS IV BOLUS
500.0000 mL | Freq: Once | INTRAVENOUS | Status: AC
  Administered 2019-10-20: 16:00:00 500 mL via INTRAVENOUS

## 2019-10-20 MED ORDER — FENTANYL CITRATE (PF) 100 MCG/2ML IJ SOLN
INTRAMUSCULAR | Status: AC
  Filled 2019-10-20: qty 2

## 2019-10-20 MED ORDER — VANCOMYCIN HCL 1500 MG/300ML IV SOLN
1500.0000 mg | INTRAVENOUS | Status: AC
  Administered 2019-10-20: 1500 mg via INTRAVENOUS
  Filled 2019-10-20: qty 300

## 2019-10-20 MED ORDER — OXYCODONE HCL 5 MG PO TABS
5.0000 mg | ORAL_TABLET | ORAL | Status: DC | PRN
  Administered 2019-10-20 – 2019-10-21 (×4): 10 mg via ORAL
  Filled 2019-10-20 (×4): qty 2

## 2019-10-20 MED ORDER — MEPERIDINE HCL 50 MG/ML IJ SOLN: 6.2500 mg | INTRAMUSCULAR | Status: DC | PRN

## 2019-10-20 MED ORDER — HYDROMORPHONE HCL 1 MG/ML IJ SOLN
INTRAMUSCULAR | Status: DC | PRN
  Administered 2019-10-20: 1 mg via INTRAVENOUS

## 2019-10-20 MED ORDER — FENTANYL CITRATE (PF) 100 MCG/2ML IJ SOLN
25.0000 ug | INTRAMUSCULAR | Status: DC | PRN
  Administered 2019-10-20: 50 ug via INTRAVENOUS

## 2019-10-20 MED ORDER — NITROGLYCERIN 0.4 MG SL SUBL: 0.4000 mg | SUBLINGUAL_TABLET | SUBLINGUAL | Status: DC | PRN

## 2019-10-20 MED ORDER — ACETAMINOPHEN 160 MG/5ML PO SOLN: 325.0000 mg | ORAL | Status: DC | PRN

## 2019-10-20 MED ORDER — DEXAMETHASONE SODIUM PHOSPHATE 10 MG/ML IJ SOLN
INTRAMUSCULAR | Status: DC | PRN
  Administered 2019-10-20: 10 mg

## 2019-10-20 MED ORDER — BUPROPION HCL ER (XL) 300 MG PO TB24
300.0000 mg | ORAL_TABLET | Freq: Every day | ORAL | Status: DC
  Administered 2019-10-21: 300 mg via ORAL
  Filled 2019-10-20: qty 1

## 2019-10-20 MED ORDER — BUPIVACAINE HCL (PF) 0.25 % IJ SOLN
INTRAMUSCULAR | Status: AC
  Filled 2019-10-20: qty 30

## 2019-10-20 MED ORDER — VANCOMYCIN HCL IN DEXTROSE 1-5 GM/200ML-% IV SOLN: 1000.0000 mg | INTRAVENOUS | Status: DC

## 2019-10-20 MED ORDER — METHOCARBAMOL 500 MG IVPB - SIMPLE MED
INTRAVENOUS | Status: AC
  Administered 2019-10-20: 500 mg via INTRAVENOUS
  Filled 2019-10-20: qty 50

## 2019-10-20 MED ORDER — ROPIVACAINE HCL 7.5 MG/ML IJ SOLN
INTRAMUSCULAR | Status: DC | PRN
  Administered 2019-10-20: 30 mL via PERINEURAL

## 2019-10-20 MED ORDER — PHENYLEPHRINE HCL-NACL 10-0.9 MG/250ML-% IV SOLN
INTRAVENOUS | Status: DC | PRN
  Administered 2019-10-20: 30 ug/min via INTRAVENOUS
  Administered 2019-10-20: 40 ug/min via INTRAVENOUS

## 2019-10-20 MED ORDER — MEPIVACAINE HCL (PF) 2 % IJ SOLN
INTRAMUSCULAR | Status: DC | PRN
  Administered 2019-10-20: 3 mL via EPIDURAL

## 2019-10-20 MED FILL — OXYCODONE-APAP 5-325MG: 5-325 | 5 days supply | Qty: 30 | Fill #0

## 2019-10-20 MED FILL — tiZANidine HCL 2 MG TABS: 2 | 15 days supply | Qty: 60 | Fill #0

## 2019-10-20 SURGICAL SUPPLY — 54 items
ATTUNE PS FEM LT SZ 5 CEM KNEE (Femur) ×1 IMPLANT
ATTUNE PSRP INSR SZ5 5 KNEE (Insert) ×1 IMPLANT
BAG DECANTER FOR FLEXI CONT (MISCELLANEOUS) ×2 IMPLANT
BAG SPEC THK2 15X12 ZIP CLS (MISCELLANEOUS) ×1
BAG ZIPLOCK 12X15 (MISCELLANEOUS) ×2 IMPLANT
BASE TIBIA ATTUNE KNEE SYS SZ6 (Knees) IMPLANT
BLADE SAG 18X100X1.27 (BLADE) ×2 IMPLANT
BLADE SAW SGTL 11.0X1.19X90.0M (BLADE) ×2 IMPLANT
BLADE SURG SZ10 CARB STEEL (BLADE) ×4 IMPLANT
BNDG CMPR MED 10X6 ELC LF (GAUZE/BANDAGES/DRESSINGS) ×1
BNDG ELASTIC 6X10 VLCR STRL LF (GAUZE/BANDAGES/DRESSINGS) ×2 IMPLANT
BOWL SMART MIX CTS (DISPOSABLE) ×2 IMPLANT
BSPLAT TIB 6 CMNT ROT PLAT STR (Knees) ×1 IMPLANT
CEMENT HV SMART SET (Cement) ×4 IMPLANT
COVER SURGICAL LIGHT HANDLE (MISCELLANEOUS) ×2 IMPLANT
COVER WAND RF STERILE (DRAPES) IMPLANT
CUFF TOURN SGL QUICK 34 (TOURNIQUET CUFF) ×2
CUFF TRNQT CYL 34X4.125X (TOURNIQUET CUFF) ×1 IMPLANT
DECANTER SPIKE VIAL GLASS SM (MISCELLANEOUS) ×5 IMPLANT
DRAPE U-SHAPE 47X51 STRL (DRAPES) ×2 IMPLANT
DRSG AQUACEL AG ADV 3.5X10 (GAUZE/BANDAGES/DRESSINGS) ×2 IMPLANT
DURAPREP 26ML APPLICATOR (WOUND CARE) ×2 IMPLANT
ELECT REM PT RETURN 15FT ADLT (MISCELLANEOUS) ×2 IMPLANT
GLOVE BIO SURGEON STRL SZ7.5 (GLOVE) ×2 IMPLANT
GLOVE BIO SURGEON STRL SZ8.5 (GLOVE) ×2 IMPLANT
GLOVE BIOGEL PI IND STRL 8 (GLOVE) ×1 IMPLANT
GLOVE BIOGEL PI IND STRL 9 (GLOVE) ×1 IMPLANT
GLOVE BIOGEL PI INDICATOR 8 (GLOVE) ×1
GLOVE BIOGEL PI INDICATOR 9 (GLOVE) ×1
GOWN STRL REUS W/TWL XL LVL3 (GOWN DISPOSABLE) ×4 IMPLANT
HANDPIECE INTERPULSE COAX TIP (DISPOSABLE) ×2
HOOD PEEL AWAY FLYTE STAYCOOL (MISCELLANEOUS) ×6 IMPLANT
KIT TURNOVER KIT A (KITS) IMPLANT
NDL HYPO 21X1.5 SAFETY (NEEDLE) ×2 IMPLANT
NEEDLE HYPO 21X1.5 SAFETY (NEEDLE) ×4 IMPLANT
NS IRRIG 1000ML POUR BTL (IV SOLUTION) ×2 IMPLANT
PACK ICE MAXI GEL EZY WRAP (MISCELLANEOUS) ×2 IMPLANT
PACK TOTAL KNEE CUSTOM (KITS) ×2 IMPLANT
PATELLA MEDIAL ATTUN 35MM KNEE (Knees) ×1 IMPLANT
PENCIL SMOKE EVACUATOR (MISCELLANEOUS) ×1 IMPLANT
PIN DRILL FIX HALF THREAD (BIT) ×1 IMPLANT
PIN STEINMAN FIXATION KNEE (PIN) ×1 IMPLANT
PROTECTOR NERVE ULNAR (MISCELLANEOUS) ×2 IMPLANT
SET HNDPC FAN SPRY TIP SCT (DISPOSABLE) ×1 IMPLANT
SUT VIC AB 1 CTX 36 (SUTURE) ×2
SUT VIC AB 1 CTX36XBRD ANBCTR (SUTURE) ×1 IMPLANT
SUT VIC AB 3-0 CT1 27 (SUTURE) ×6
SUT VIC AB 3-0 CT1 TAPERPNT 27 (SUTURE) ×3 IMPLANT
SYR CONTROL 10ML LL (SYRINGE) ×4 IMPLANT
TIBIA ATTUNE KNEE SYS BASE SZ6 (Knees) ×2 IMPLANT
TRAY FOLEY MTR SLVR 14FR STAT (SET/KITS/TRAYS/PACK) ×1 IMPLANT
WATER STERILE IRR 1000ML POUR (IV SOLUTION) ×4 IMPLANT
WRAP KNEE MAXI GEL POST OP (GAUZE/BANDAGES/DRESSINGS) ×1 IMPLANT
YANKAUER SUCT BULB TIP 10FT TU (MISCELLANEOUS) ×2 IMPLANT

## 2019-10-20 NOTE — Interval H&P Note (Signed)
History and Physical Interval Note:  10/20/2019 1:27 PM  Cynthia [name]  has presented today for surgery, with the diagnosis of LEFT KNEE OSTEOARTHRITIS.  The various methods of treatment have been discussed with the patient and family. After consideration of risks, benefits and other options for treatment, the patient has consented to  Procedure(s): LEFT TOTAL KNEE ARTHROPLASTY (Left) as a surgical intervention.  The patient's history has been reviewed, patient examined, no change in status, stable for surgery.  I have reviewed the patient's chart and labs.  Questions were answered to the patient's satisfaction.     Kerin Salen

## 2019-10-20 NOTE — Discharge Instructions (Signed)

## 2019-10-20 NOTE — Progress Notes (Signed)
Assisted Dr. Oddono with left, ultrasound guided, adductor canal block. Side rails up, monitors on throughout procedure. See vital signs in flow sheet. Tolerated Procedure well.  

## 2019-10-20 NOTE — Anesthesia Preprocedure Evaluation (Addendum)
Anesthesia Evaluation  Patient identified by MRN, date of birth, ID band Patient awake    Reviewed: Allergy & Precautions, NPO status , Patient's Chart, lab work & pertinent test results  Airway Mallampati: II  TM Distance: >3 FB Neck ROM: Full    Dental no notable dental hx.    Pulmonary neg pulmonary ROS,    Pulmonary exam normal breath sounds clear to auscultation       Cardiovascular hypertension, Pt. on medications + CAD  + Valvular Problems/Murmurs AS  Rhythm:Regular Rate:Normal + Systolic murmurs Cath [id number] Conclusions: 1. Moderate to severe, non-critical single-vessel coronary artery disease with 60-70% proximal LAD stenosis at origin of large diagonal branch ([place] 1,0,0). 2. Low normal left ventricular systolic function with subtle anterior hypokinesis. 3. Mildly elevated left ventricular filling pressure.   Neuro/Psych negative neurological ROS  negative psych ROS   GI/Hepatic negative GI ROS, Neg liver ROS,   Endo/Other  negative endocrine ROS  Renal/GU negative Renal ROS  negative genitourinary   Musculoskeletal negative musculoskeletal ROS (+)   Abdominal   Peds negative pediatric ROS (+)  Hematology negative hematology ROS (+)   Anesthesia Other Findings   Reproductive/Obstetrics negative OB ROS                            Anesthesia Physical  Anesthesia Plan  ASA: II  Anesthesia Plan: Spinal   Post-op Pain Management:  Regional for Post-op pain   Induction: Intravenous  PONV Risk Score and Plan: 2 and Ondansetron and Dexamethasone  Airway Management Planned: Simple Face Mask  Additional Equipment:   Intra-op Plan:   Post-operative Plan:   Informed Consent: I have reviewed the patients History and Physical, chart, labs and discussed the procedure including the risks, benefits and alternatives for the proposed anesthesia with the patient or authorized  representative who has indicated his/her understanding and acceptance.     Dental advisory given  Plan Discussed with: CRNA, Surgeon and Anesthesiologist  Anesthesia Plan Comments:         Anesthesia Quick Evaluation

## 2019-10-20 NOTE — Anesthesia Procedure Notes (Signed)
Procedure Name: MAC Date/Time: 10/20/2019 1:28 PM Performed by: Lissa Morales, CRNA Pre-anesthesia Checklist: Patient identified, Emergency Drugs available, Suction available, Patient being monitored and Timeout performed Patient Re-evaluated:Patient Re-evaluated prior to induction Oxygen Delivery Method: Simple face mask Placement Confirmation: positive ETCO2

## 2019-10-20 NOTE — Addendum Note (Signed)
Addendum  created 10/20/19 1804 by Lissa Morales, CRNA   Intraprocedure Event edited, Intraprocedure Staff edited

## 2019-10-20 NOTE — Anesthesia Procedure Notes (Signed)
Spinal  Patient location during procedure: OR End time: 10/20/2019 1:35 PM Staffing Performed: resident/CRNA  Resident/CRNA: Lissa Morales, CRNA Preanesthetic Checklist Completed: patient identified, IV checked, site marked, risks and benefits discussed, surgical consent, monitors and equipment checked, pre-op evaluation and timeout performed Spinal Block Patient position: sitting Prep: DuraPrep Patient monitoring: heart rate, continuous pulse ox and blood pressure Approach: midline Location: L2-3 Injection technique: single-shot Needle Needle type: Sprotte and Pencan  Needle gauge: 24 G Needle length: 9 cm Additional Notes Expiration date of kit checked and confirmed. Patient tolerated procedure well, without complications.

## 2019-10-20 NOTE — Transfer of Care (Signed)
Immediate Anesthesia Transfer of Care Note  Patient: Cynthia [name]  Procedure(s) Performed: LEFT TOTAL KNEE ARTHROPLASTY (Left Knee)  Patient Location: PACU  Anesthesia Type:Spinal  Level of Consciousness: awake, alert , oriented and patient cooperative  Airway & Oxygen Therapy: Patient Spontanous Breathing and Patient connected to face mask oxygen  Post-op Assessment: Report given to RN and Post -op Vital signs reviewed and stable  Post vital signs: stable  Last Vitals:  Vitals Value Taken Time  BP 129/63 10/20/19 1530  Temp 36.6 C 10/20/19 1530  Pulse 72 10/20/19 1538  Resp 13 10/20/19 1538  SpO2 93 % 10/20/19 1538  Vitals shown include unvalidated device data.  Last Pain:  Vitals:   10/20/19 1045  TempSrc: Oral  PainSc: 0-No pain      Patients Stated Pain Goal: 4 ([id number])  Complications: No apparent anesthesia complications

## 2019-10-20 NOTE — Op Note (Signed)
PATIENT ID:      MANINA SCURRY  MRN:     [id number] DOB/AGE:    [birth year] / 69 y.o.       OPERATIVE REPORT   DATE OF PROCEDURE:  10/20/2019      PREOPERATIVE DIAGNOSIS:   LEFT KNEE OSTEOARTHRITIS      Estimated body mass index is 38.15 kg/m as calculated from the following:   Height as of this encounter: 5\' 5"  (1.651 m).   Weight as of this encounter: 104 kg.                                                       POSTOPERATIVE DIAGNOSIS:   Same                                  PROCEDURE:  Procedure(s): LEFT TOTAL KNEE ARTHROPLASTY Using DepuyAttune RP implants #5L Femur, #6Tibia, 5 mm Attune RP bearing, 35 Patella    SURGEON: Kerin Salen  ASSISTANT:   Kerry Hough. [employer]   (Present and scrubbed throughout the case, critical for assistance with exposure, retraction, instrumentation, and closure.)        ANESTHESIA: Spinal, 20cc Exparel, 50cc 0.25% Marcaine EBL: 300 cc FLUID REPLACEMENT: 1500 cc crystaloid TOURNIQUET: DRAINS: None TRANEXAMIC ACID: 1gm IV, 2gm topical COMPLICATIONS:  None         INDICATIONS FOR PROCEDURE: The patient has  LEFT KNEE OSTEOARTHRITIS, Val deformities, XR shows bone on bone arthritis, lateral subluxation of tibia. Patient has failed all conservative measures including anti-inflammatory medicines, narcotics, attempts at exercise and weight loss, cortisone injections and viscosupplementation.  Risks and benefits of surgery have been discussed, questions answered.   DESCRIPTION OF PROCEDURE: The patient identified by armband, received  IV antibiotics, in the holding area at [facility]. Patient taken to the operating room, appropriate anesthetic monitors were attached, and Spinal anesthesia was  induced. IV Tranexamic acid was given.Tourniquet applied high to the operative thigh. Lateral post and foot positioner applied to the table, the lower extremity was then prepped and draped in usual sterile fashion from the toes to the tourniquet. Time-out  procedure was performed. The skin and subcutaneous tissue along the incision was injected with 20 cc of a mixture of Exparel and Marcaine solution, using a 20-gauge by 1-1/2 inch needle. We began the operation, with the knee flexed 130 degrees, by making the anterior midline incision starting at handbreadth above the patella going over the patella 1 cm medial to and 4 cm distal to the tibial tubercle. Small bleeders in the skin and the subcutaneous tissue identified and cauterized. Transverse retinaculum was incised and reflected medially and a medial parapatellar arthrotomy was accomplished. the patella was everted and theprepatellar fat pad resected. The superficial medial collateral ligament was then elevated from anterior to posterior along the proximal flare of the tibia and anterior half of the menisci resected. The knee was hyperflexed exposing bone on bone arthritis. Peripheral and notch osteophytes as well as the cruciate ligaments were then resected. We continued to work our way around posteriorly along the proximal tibia, and externally rotated the tibia subluxing it out from underneath the femur. A McHale PCL retractor was placed through the notch and a lateral Hohmann retractor placed, and  we then entered the proximal tibia in line with the Depuy starter drill in line with the axis of the tibia followed by an intramedullary guide rod and 0-degree posterior slope cutting guide. The tibial cutting guide, 4 degree posterior sloped, was pinned into place allowing resection of 8 mm of bone medially and 5 mm of bone laterally. Satisfied with the tibial resection, we then entered the distal femur 2 mm anterior to the PCL origin with the intramedullary guide rod and applied the distal femoral cutting guide set at 9 mm, with 5 degrees of valgus. This was pinned along the epicondylar axis. At this point, the distal femoral cut was accomplished without difficulty. We then sized for a #5L femoral component and  pinned the guide in 0 degrees of external rotation. The chamfer cutting guide was pinned into place. The anterior, posterior, and chamfer cuts were accomplished without difficulty followed by the Attune RP box cutting guide and the box cut. We also removed posterior osteophytes from the posterior femoral condyles. The posterior capsule was injected with Exparel solution. The knee was brought into full extension. We checked our extension gap and fit a 5 mm bearing. Distracting in extension with a lamina spreader,  bleeders in the posterior capsule, Posterior medial and posterior lateral gutter were cauterized.  The transexamic acid-soaked sponge was then placed in the gap of the knee in extension. The knee was flexed 30. The posterior patella cut was accomplished with the 9.5 mm Attune cutting guide, sized for a 80mm dome, and the fixation pegs drilled.The knee was then once again hyperflexed exposing the proximal tibia. We sized for a # 6 tibial base plate, applied the smokestack and the conical reamer followed by the the Delta fin keel punch. We then hammered into place the Attune RP trial femoral component, drilled the lugs, inserted a  5 mm trial bearing, trial patellar button, and took the knee through range of motion from 0-130 degrees. Medial and lateral ligamentous stability was checked. No thumb pressure was required for patellar Tracking. The tourniquet was not used. All trial components were removed, mating surfaces irrigated with pulse lavage, and dried with suction and sponges. 10 cc of the Exparel solution was applied to the cancellus bone of the patella distal femur and proximal tibia.  After waiting 30 seconds, the bony surfaces were again, dried with sponges. A double batch of DePuy HV cement was mixed and applied to all bony metallic mating surfaces except for the posterior condyles of the femur itself. In order, we hammered into place the tibial tray and removed excess cement, the femoral  component and removed excess cement. The final Attune RP bearing was inserted, and the knee brought to full extension with compression. The patellar button was clamped into place, and excess cement removed. The knee was held at 30 flexion with compression, while the cement cured. The wound was irrigated out with normal saline solution pulse lavage. The rest of the Exparel was injected into the parapatellar arthrotomy, subcutaneous tissues, and periosteal tissues. The parapatellar arthrotomy was closed with running #1 Vicryl suture. The subcutaneous tissue with 0 and 2-0 undyed Vicryl suture, and the skin with running 3-0 SQ vicryl. An Aquacil and Ace wrap were applied. The patient was taken to recovery room without difficulty.   Kerin Salen 10/20/2019, 1:28 PM

## 2019-10-20 NOTE — Anesthesia Postprocedure Evaluation (Signed)
Anesthesia Post Note  Patient: Cynthia [name]  Procedure(s) Performed: LEFT TOTAL KNEE ARTHROPLASTY (Left Knee)     Patient location during evaluation: PACU Anesthesia Type: Spinal Level of consciousness: oriented and awake and alert Pain management: pain level controlled Vital Signs Assessment: post-procedure vital signs reviewed and stable Respiratory status: spontaneous breathing, respiratory function stable and patient connected to nasal cannula oxygen Cardiovascular status: blood pressure returned to baseline and stable Postop Assessment: no headache, no backache and no apparent nausea or vomiting Anesthetic complications: no    Last Vitals:  Vitals:   10/20/19 1615 10/20/19 1627  BP: 121/63 139/63  Pulse: 69 63  Resp: 13 16  Temp: (!) 36.3 C   SpO2: 96% 95%    Last Pain:  Vitals:   10/20/19 1627  TempSrc:   PainSc: 0-No pain                 Keaton Stirewalt

## 2019-10-20 NOTE — Progress Notes (Signed)
Report to Valinda Party, RN in phase II pacu. Pt ambulating with PT.

## 2019-10-20 NOTE — Evaluation (Signed)
Physical Therapy Evaluation Patient Details Name: Cynthia [name] MRN: [id number] DOB: 12-14-1950 Today's Date: 10/20/2019   History of Present Illness  (P) Patient is 69 y.o. female s/p Lt TKA on 10/20/19 with PMH significant for Raynauds, HTN, GERD, CAD, OA, anxiety, and Rt TKA in October 2020.       Clinical Impression  KYMANI FOULKES is a 69 y.o. female POD 0 s/p Lt TKA. Patient reports independence with occasional use of SPC for mobility at baseline. Patient is now limited by functional impairments (see PT problem list below) and requires min assist/guard for transfers and gait with RW. Patient was able to ambulate ~130 feet with 1 seated rest break between and with RW and min guard and cues for safe walker management. Patient educated on safe sequencing for stair mobility and verbalized safe guarding position for people assisting with mobility; she had some trouble sequencing SPC placement. Patient instructed in exercises to facilitate ROM and circulation. Patient experienced dizziness and lightheaded sensation with mobility. Patient will benefit from continued skilled PT interventions to address impairments and progress towards PLOF.     Follow Up Recommendations Follow surgeon's recommendation for DC plan and follow-up therapies    Equipment Recommendations  None recommended by PT    Recommendations for Other Services       Precautions / Restrictions        Mobility  Bed Mobility Overal bed mobility: Needs Assistance Bed Mobility: Supine to Sit     Supine to sit: HOB elevated;Min guard     General bed mobility comments: cues for use of gait belt to assist with LE mobility, pt slow and increased effort  Transfers Overall transfer level: Needs assistance Equipment used: Rolling walker (2 wheeled) Transfers: Sit to/from Stand Sit to Stand: Min assist;Min guard         General transfer comment: cues for safe hand placement and technique with RW, no assist required to  stand from EOB. Pt requrerd min assist to rise and steady wtih standing from recliner.  Ambulation/Gait Ambulation/Gait assistance: Min guard Gait Distance (Feet): 130 Feet Assistive device: Rolling walker (2 wheeled) Gait Pattern/deviations: Step-to pattern;Decreased step length - right;Decreased stance time - left;Wide base of support Gait velocity: slow   General Gait Details: pt slightly unsteady with gait and required cues for safe hand placement, proximitiy, and step pattern within RW. pt continued to slow down as she walked reporting increased knee pain and lightheadedness. No significant drop in BP noted however pt sypmtoms subsided with seated rest and progressed with upright activity.  Stairs Stairs: Yes Stairs assistance: Min assist Stair Management: One rail Right;With cane;Forwards;Step to pattern Number of Stairs: 6(2x3) General stair comments: cues for safe step sequencing and safe SPC placement, min assist required with tectile/verbal cues throughout.  Wheelchair Mobility    Modified Rankin (Stroke Patients Only)       Balance Overall balance assessment: Needs assistance Sitting-balance support: Feet supported Sitting balance-Leahy Scale: Good     Standing balance support: During functional activity;Bilateral upper extremity supported Standing balance-Leahy Scale: Poor                  Pertinent Vitals/Pain Pain Assessment: 0-10 Pain Score: 6  Pain Location: Lt knee Pain Descriptors / Indicators: Aching;Sore Pain Intervention(s): Limited activity within patient's tolerance;Monitored during session    Hunter expects to be discharged to:: Private residence Living Arrangements: Spouse/significant other Available Help at Discharge: Available 24 hours/day Type of Home: House Home Access: Stairs  to enter Entrance Stairs-Rails: Left Entrance Stairs-Number of Steps: 4 Home Layout: One level Home Equipment: Cane - single point;Walker  - 2 wheels;Bedside commode      Prior Function Level of Independence: Independent with assistive device(s)         Comments: used SPC prn     Hand Dominance   Dominant Hand: Right    Extremity/Trunk Assessment   Upper Extremity Assessment Upper Extremity Assessment: Overall WFL for tasks assessed    Lower Extremity Assessment Lower Extremity Assessment: LLE deficits/detail LLE Deficits / Details: good quad activation, no extensor lag with SLR    Cervical / Trunk Assessment Cervical / Trunk Assessment: Normal  Communication   Communication: No difficulties  Cognition Arousal/Alertness: Awake/alert Behavior During Therapy: WFL for tasks assessed/performed Overall Cognitive Status: Within Functional Limits for tasks assessed               General Comments      Exercises Total Joint Exercises Ankle Circles/Pumps: AROM;10 reps;Seated;Both Quad Sets: AROM;5 reps;Seated Gluteal Sets: Left Short Arc Quad: AROM;5 reps;Seated;Left Heel Slides: AROM;5 reps;Seated;Left Hip ABduction/ADduction: AROM;5 reps;Seated;Left Straight Leg Raises: AROM;5 reps;Supine;Left Long Arc Quad: AROM;5 reps;Seated;Left Knee Flexion: AROM;5 reps;Seated;Left;AAROM   Assessment/Plan    PT Assessment Patient needs continued PT services  PT Problem List Decreased activity tolerance;Decreased range of motion;Decreased strength;Decreased balance;Decreased mobility;Decreased knowledge of use of DME       PT Treatment Interventions DME instruction;Functional mobility training;Balance training;Patient/family education;Gait training;Therapeutic activities;Therapeutic exercise;Stair training    PT Goals (Current goals can be found in the Care Plan section)  Acute Rehab PT Goals Patient Stated Goal: to get home safely PT Goal Formulation: With patient Time For Goal Achievement: 10/27/19 Potential to Achieve Goals: Good    Frequency 7X/week   Barriers to discharge        Co-evaluation                AM-PAC PT "6 Clicks" Mobility  Outcome Measure Help needed turning from your back to your side while in a flat bed without using bedrails?: A Little Help needed moving from lying on your back to sitting on the side of a flat bed without using bedrails?: A Little Help needed moving to and from a bed to a chair (including a wheelchair)?: A Little Help needed standing up from a chair using your arms (e.g., wheelchair or bedside chair)?: A Little Help needed to walk in hospital room?: A Little Help needed climbing 3-5 steps with a railing? : A Little 6 Click Score: 18    End of Session Equipment Utilized During Treatment: Gait belt Activity Tolerance: Patient tolerated treatment well Patient left: with call bell/phone within reach;in chair Nurse Communication: Mobility status;Patient requests pain meds PT Visit Diagnosis: Muscle weakness (generalized) (M62.81);Difficulty in walking, not elsewhere classified (R26.2)    Time: [id number] PT Time Calculation (min) (ACUTE ONLY): 56 min   Charges:   PT Evaluation $PT Eval Low Complexity: 1 Low PT Treatments $Gait Training: 23-37 mins $Therapeutic Exercise: 8-22 mins        Verner Mould, DPT Physical Therapist with [facility] [phone]  10/20/2019 7:40 PM

## 2019-10-20 NOTE — Anesthesia Procedure Notes (Signed)
Anesthesia Regional Block: Adductor canal block   Pre-Anesthetic Checklist: ,, timeout performed, Correct Patient, Correct Site, Correct Laterality, Correct Procedure, Correct Position, site marked, Risks and benefits discussed,  Surgical consent,  Pre-op evaluation,  At surgeon's request and post-op pain management  Laterality: Left  Prep: chloraprep       Needles:  Injection technique: Single-shot  Needle Type: Echogenic Stimulator Needle     Needle Length: 5cm  Needle Gauge: 22     Additional Needles:   Procedures:, nerve stimulator,,, ultrasound used (permanent image in chart),,,,  Narrative:  Start time: 10/20/2019 1:10 PM End time: 10/20/2019 1:15 PM Injection made incrementally with aspirations every 5 mL.  Performed by: Personally  Anesthesiologist: Janeece Riggers, MD  Additional Notes: Functioning IV was confirmed and monitors were applied.  A 34mm 22ga Arrow echogenic stimulator needle was used. Sterile prep and drape,hand hygiene and sterile gloves were used. Ultrasound guidance: relevant anatomy identified, needle position confirmed, local anesthetic spread visualized around nerve(s)., vascular puncture avoided.  Image printed for medical record. Negative aspiration and negative test dose prior to incremental administration of local anesthetic. The patient tolerated the procedure well.

## 2019-10-21 ENCOUNTER — Encounter: Payer: Self-pay | Admitting: *Deleted

## 2019-10-21 DIAGNOSIS — Z6838 Body mass index (BMI) 38.0-38.9, adult: Secondary | ICD-10-CM | POA: Diagnosis not present

## 2019-10-21 DIAGNOSIS — K219 Gastro-esophageal reflux disease without esophagitis: Secondary | ICD-10-CM | POA: Diagnosis not present

## 2019-10-21 DIAGNOSIS — M1712 Unilateral primary osteoarthritis, left knee: Secondary | ICD-10-CM | POA: Diagnosis not present

## 2019-10-21 DIAGNOSIS — I2510 Atherosclerotic heart disease of native coronary artery without angina pectoris: Secondary | ICD-10-CM | POA: Diagnosis not present

## 2019-10-21 DIAGNOSIS — E669 Obesity, unspecified: Secondary | ICD-10-CM | POA: Diagnosis not present

## 2019-10-21 DIAGNOSIS — F329 Major depressive disorder, single episode, unspecified: Secondary | ICD-10-CM | POA: Diagnosis not present

## 2019-10-21 DIAGNOSIS — E785 Hyperlipidemia, unspecified: Secondary | ICD-10-CM | POA: Diagnosis not present

## 2019-10-21 DIAGNOSIS — Z7982 Long term (current) use of aspirin: Secondary | ICD-10-CM | POA: Diagnosis not present

## 2019-10-21 DIAGNOSIS — Z96651 Presence of right artificial knee joint: Secondary | ICD-10-CM | POA: Diagnosis not present

## 2019-10-21 DIAGNOSIS — Z79899 Other long term (current) drug therapy: Secondary | ICD-10-CM | POA: Diagnosis not present

## 2019-10-21 DIAGNOSIS — F419 Anxiety disorder, unspecified: Secondary | ICD-10-CM | POA: Diagnosis not present

## 2019-10-21 DIAGNOSIS — I10 Essential (primary) hypertension: Secondary | ICD-10-CM | POA: Diagnosis not present

## 2019-10-21 LAB — CBC
HCT: 33.6 % — ABNORMAL LOW (ref 36.0–46.0)
Hemoglobin: 10.6 g/dL — ABNORMAL LOW (ref 12.0–15.0)
MCH: 27.2 pg (ref 26.0–34.0)
MCHC: 31.5 g/dL (ref 30.0–36.0)
MCV: 86.4 fL (ref 80.0–100.0)
Platelets: 241 10*3/uL (ref 150–400)
RBC: 3.89 MIL/uL (ref 3.87–5.11)
RDW: 14.2 % (ref 11.5–15.5)
WBC: 11.1 10*3/uL — ABNORMAL HIGH (ref 4.0–10.5)
nRBC: 0 % (ref 0.0–0.2)

## 2019-10-21 LAB — BASIC METABOLIC PANEL
Anion gap: 8 (ref 5–15)
BUN: 14 mg/dL (ref 8–23)
CO2: 25 mmol/L (ref 22–32)
Calcium: 8.5 mg/dL — ABNORMAL LOW (ref 8.9–10.3)
Chloride: 101 mmol/L (ref 98–111)
Creatinine, Ser: 0.65 mg/dL (ref 0.44–1.00)
GFR calc Af Amer: >60 mL/min (ref >60)
GFR calc non Af Amer: >60 mL/min (ref >60)
Glucose, Bld: 240 mg/dL — ABNORMAL HIGH (ref 70–99)
Potassium: 3.9 mmol/L (ref 3.5–5.1)
Sodium: 134 mmol/L — ABNORMAL LOW (ref 135–145)

## 2019-10-21 NOTE — Progress Notes (Signed)
Physical Therapy Treatment Patient Details Name: Cynthia [name] MRN: [id number] DOB: [birth year] Today's Date: 10/21/2019    History of Present Illness L TKR    PT Comments    POD # 1  Assisted OOB to amb and practice stairs.  General bed mobility comments: demonstarted and instructed how to use belt to self assist LE off bed.  General transfer comment: 25% VC's on proper hand placement, LE advancement and safety withy turns  General Gait Details: slight instability initially but tolerated distance well with increased confidence with increased distance.  25% VC's safety with turns and to decrease step length to increase stability. General stair comments: instructed on forward with walker as long as she has hands on assist to steady walker.  Pt felt more secure with this tech vs one rail/SPC.  Then returned to room to perform some TE's following HEP handout.  Instructed on proper tech, freq as well as use of ICE.   Addressed all mobility questions, discussed appropriate activity, educated on use of ICE.  Pt ready for D/C to home.   Follow Up Recommendations  Follow surgeon's recommendation for DC plan and follow-up therapies;Home health PT     Equipment Recommendations  None recommended by PT    Recommendations for Other Services       Precautions / Restrictions Precautions Precautions: Knee Precaution Comments: instructed no pillow under knee Restrictions Weight Bearing Restrictions: No Other Position/Activity Restrictions: WBAT    Mobility  Bed Mobility Overal bed mobility: Needs Assistance Bed Mobility: Supine to Sit           General bed mobility comments: demonstarted and instructed how to use belt to self assist LE off bed  Transfers Overall transfer level: Needs assistance Equipment used: Rolling walker (2 wheeled) Transfers: Sit to/from Stand Sit to Stand: Supervision;Min guard         General transfer comment: 25% VC's on proper hand placement, LE  advancement and safety withy turns  Ambulation/Gait Ambulation/Gait assistance: Supervision;Min guard Gait Distance (Feet): 115 Feet Assistive device: Rolling walker (2 wheeled) Gait Pattern/deviations: Step-to pattern;Decreased step length - right;Decreased stance time - left;Wide base of support Gait velocity: decreased   General Gait Details: slight instability initially but tolerated distance well with increased confidence with increased distance.  25% VC's safety with turns and to decrease step length to increase stability.   Stairs   Stairs assistance: Min assist Stair Management: No rails;Step to pattern;Forwards Number of Stairs: 2 General stair comments: instructed on forward with walker as long as she has hands on assist to steady walker.  Pt felt more secure with this tech vs one rail/SPC   Wheelchair Mobility    Modified Rankin (Stroke Patients Only)       Balance                                            Cognition Arousal/Alertness: Awake/alert Behavior During Therapy: WFL for tasks assessed/performed Overall Cognitive Status: Within Functional Limits for tasks assessed                                 General Comments: this is her second TKR      Exercises   Total Knee Replacement TE's 10 reps B LE ankle pumps 10 reps towel squeezes 10 reps knee presses 10  reps heel slides  10 reps SAQ's 10 reps SLR's 10 reps ABD Followed by ICE    General Comments        Pertinent Vitals/Pain Pain Assessment: 0-10 Pain Score: 3  Pain Location: L knee Pain Descriptors / Indicators: Aching;Sore;Operative site guarding Pain Intervention(s): Monitored during session;Premedicated before session;Repositioned;Ice applied    Home Living                      Prior Function            PT Goals (current goals can now be found in the care plan section)      Frequency    7X/week      PT Plan Current plan  remains appropriate    Co-evaluation              AM-PAC PT "6 Clicks" Mobility   Outcome Measure  Help needed turning from your back to your side while in a flat bed without using bedrails?: A Little Help needed moving from lying on your back to sitting on the side of a flat bed without using bedrails?: A Little Help needed moving to and from a bed to a chair (including a wheelchair)?: A Little Help needed standing up from a chair using your arms (e.g., wheelchair or bedside chair)?: A Little Help needed to walk in hospital room?: A Little Help needed climbing 3-5 steps with a railing? : A Little 6 Click Score: 18    End of Session Equipment Utilized During Treatment: Gait belt Activity Tolerance: Patient tolerated treatment well Patient left: with call bell/phone within reach;in chair Nurse Communication: Mobility status(pt ready for D/C to home) PT Visit Diagnosis: Muscle weakness (generalized) (M62.81);Difficulty in walking, not elsewhere classified (R26.2)     Time:  - 9:04 - 9:47    Charges:   1 gt    1 ta    1 te                     Rica Koyanagi  PTA Acute  Rehabilitation Services Pager      [phone] Office      [phone]

## 2019-10-21 NOTE — Plan of Care (Signed)
Pt discharged to home

## 2019-10-21 NOTE — Progress Notes (Signed)
Patient ID: ZAYANNA YECKLEY, female   DOB: 08-14-1951, 69 y.o.   MRN: [id number] PATIENT ID: CAROLLEE MUMMA  MRN: [id number]  DOB/AGE:  1951-03-24 / 69 y.o.  1 Day Post-Op Procedure(s) (LRB): LEFT TOTAL KNEE ARTHROPLASTY (Left)    PROGRESS NOTE Subjective: Patient is alert, oriented, no Nausea, no Vomiting, yes passing gas. Taking PO well. Denies SOB, Chest or Calf Pain. Using Incentive Spirometer, PAS in place. Ambulate 130' slight insteady, Patient reports pain as 2/10 .    Objective: Vital signs in last 24 hours: Vitals:   10/20/19 2059 10/20/19 2158 10/20/19 2336 10/21/19 0207  BP: 130/63 135/67 123/64 122/60  Pulse: 64 68 63 61  Resp: 18 18 16 18   Temp: 97.8 F (36.6 C) 97.9 F (36.6 C) (Abnormal) 97.5 F (36.4 C) (Abnormal) 97.4 F (36.3 C)  TempSrc: Oral Oral Oral Oral  SpO2: 99% 98% 95% 99%  Weight:      Height:          Intake/Output from previous day: I/O last 3 completed shifts: In: 4206.3 [P.O.:800; I.V.:3306.3; IV Piggyback:100] Out: 3075 [Urine:2875; Blood:200]   Intake/Output this shift: No intake/output data recorded.   LABORATORY DATA: Recent Labs    10/18/19 1010 10/18/19 1010 10/21/19 0407  WBC 7.6  --  11.1*  HGB 13.2  --  10.6*  HCT 41.9  --  33.6*  PLT 291  --  241  NA 136  --  134*  K 4.5  --  3.9  CL 102  --  101  CO2 24  --  25  BUN 14  --  14  CREATININE 0.75  --  0.65  GLUCOSE 118*  --  240*  INR 1.0  --   --   CALCIUM 9.3   < > 8.5*   < > = values in this interval not displayed.    Examination: Neurologically intact ABD soft Neurovascular intact Sensation intact distally Intact pulses distally Dorsiflexion/Plantar flexion intact Incision: dressing C/D/I No cellulitis present Compartment soft}  Assessment:   1 Day Post-Op Procedure(s) (LRB): LEFT TOTAL KNEE ARTHROPLASTY (Left) ADDITIONAL DIAGNOSIS: Expected Acute Blood Loss Anemia, Hypertension,Class II morbid obesity, demand ischemia.  Patient's anticipated LOS is less  than 2 midnights, meeting these requirements: - Younger than 73 - Lives within 1 hour of care - Has a competent adult at home to recover with post-op recover - NO history of  - Chronic pain requiring opiods  - Diabetes  - Coronary Artery Disease  - Heart failure  - Heart attack  - Stroke  - DVT/VTE  - Cardiac arrhythmia  - Respiratory Failure/COPD  - Renal failure  - Anemia  - Advanced Liver disease       Plan: PT/OT WBAT, AROM and PROM  DVT Prophylaxis:  SCDx72hrs, ASA 81 mg BID x 2 weeks DISCHARGE PLAN: Home, today DISCHARGE NEEDS: HHPT, Walker and 3-in-1 comode seat     Kerin Salen 10/21/2019, 8:03 AM

## 2019-10-22 DIAGNOSIS — F329 Major depressive disorder, single episode, unspecified: Secondary | ICD-10-CM | POA: Diagnosis not present

## 2019-10-22 DIAGNOSIS — E785 Hyperlipidemia, unspecified: Secondary | ICD-10-CM | POA: Diagnosis not present

## 2019-10-22 DIAGNOSIS — B372 Candidiasis of skin and nail: Secondary | ICD-10-CM | POA: Diagnosis not present

## 2019-10-22 DIAGNOSIS — I7300 Raynaud's syndrome without gangrene: Secondary | ICD-10-CM | POA: Diagnosis not present

## 2019-10-22 DIAGNOSIS — N3281 Overactive bladder: Secondary | ICD-10-CM | POA: Diagnosis not present

## 2019-10-22 DIAGNOSIS — I10 Essential (primary) hypertension: Secondary | ICD-10-CM | POA: Diagnosis not present

## 2019-10-22 DIAGNOSIS — I471 Supraventricular tachycardia: Secondary | ICD-10-CM | POA: Diagnosis not present

## 2019-10-22 DIAGNOSIS — E669 Obesity, unspecified: Secondary | ICD-10-CM | POA: Diagnosis not present

## 2019-10-22 DIAGNOSIS — I358 Other nonrheumatic aortic valve disorders: Secondary | ICD-10-CM | POA: Diagnosis not present

## 2019-10-22 DIAGNOSIS — K219 Gastro-esophageal reflux disease without esophagitis: Secondary | ICD-10-CM | POA: Diagnosis not present

## 2019-10-22 DIAGNOSIS — Z471 Aftercare following joint replacement surgery: Secondary | ICD-10-CM | POA: Diagnosis not present

## 2019-10-22 DIAGNOSIS — Z6836 Body mass index (BMI) 36.0-36.9, adult: Secondary | ICD-10-CM | POA: Diagnosis not present

## 2019-10-22 DIAGNOSIS — F419 Anxiety disorder, unspecified: Secondary | ICD-10-CM | POA: Diagnosis not present

## 2019-10-22 DIAGNOSIS — I252 Old myocardial infarction: Secondary | ICD-10-CM | POA: Diagnosis not present

## 2019-10-22 DIAGNOSIS — I2510 Atherosclerotic heart disease of native coronary artery without angina pectoris: Secondary | ICD-10-CM | POA: Diagnosis not present

## 2019-10-22 DIAGNOSIS — Z96653 Presence of artificial knee joint, bilateral: Secondary | ICD-10-CM | POA: Diagnosis not present

## 2019-10-26 MED FILL — OXYCODONE-APAP 5-325MG: 5-325 | 10 days supply | Qty: 30 | Fill #0

## 2019-10-26 NOTE — Discharge Summary (Signed)
Patient ID: Cynthia [name] MRN: [id number] DOB/AGE: Sep 27, 1951 69 y.o.  Admit date: 10/20/2019 Discharge date: 10/20/2019 Admission Diagnoses:  Principal Problem:   Degenerative arthritis of left knee Active Problems:   S/P total knee replacement, left   Discharge Diagnoses:  Same  Past Medical History:  Diagnosis Date  . Anemia   . Anxiety disorder   . Aortic valve sclerosis 05/24/2019   Moderate noted on ECHO  . Arthritis    right knee  . Cancer of the skin, basal cell   . Coronary artery disease    Moderate to severe, non-critical single-vessel coronary artery disease with 60-70% proximal LAD stenosis at origin of large diagonal branch ([place] 1,0,0).  . Demand ischemia ([place]) 05/24/2019  . Depression   . GERD (gastroesophageal reflux disease)    TUMS  . Hypertension   . Lateral meniscus derangement, right   . Raynaud's phenomenon    questionable because hands get white when cold  . SVT (supraventricular tachycardia) (HCC)     Surgeries: Procedure(s): LEFT TOTAL KNEE ARTHROPLASTY on 10/20/2019   Consultants:   Discharged Condition: Improved  Hospital Course: Cynthia [name] is an 69 y.o. female who was admitted 10/20/2019 for operative treatment ofDegenerative arthritis of left knee. Patient has severe unremitting pain that affects sleep, daily activities, and work/hobbies. After pre-op clearance the patient was taken to the operating room on 10/20/2019 and underwent  Procedure(s): LEFT TOTAL KNEE ARTHROPLASTY.    Patient was given perioperative antibiotics:  Anti-infectives (From admission, onward)   Start     Dose/Rate Route Frequency Ordered Stop   10/20/19 1957  nitrofurantoin (MACRODANTIN) capsule 100 mg  Status:  Discontinued     100 mg Oral Daily PRN 10/20/19 1957 10/21/19 1617   10/20/19 1045  vancomycin (VANCOCIN) IVPB 1000 mg/200 mL premix  Status:  Discontinued     1,000 mg 200 mL/hr over 60 Minutes Intravenous On call to O.R. 10/20/19 1037 10/20/19 1042    10/20/19 1045  vancomycin (VANCOREADY) IVPB 1500 mg/300 mL     1,500 mg 150 mL/hr over 120 Minutes Intravenous On call to O.R. 10/20/19 1042 10/20/19 1519       Patient was given sequential compression devices, early ambulation, and chemoprophylaxis to prevent DVT.  Patient benefited maximally from hospital stay and there were no complications.    Recent vital signs: No data found.   Recent laboratory studies: No results for input(s): WBC, HGB, HCT, PLT, NA, K, CL, CO2, BUN, CREATININE, GLUCOSE, INR, CALCIUM in the last 72 hours.  Invalid input(s): PT, 2   Discharge Medications:   Allergies as of 10/21/2019      Reactions   Penicillins Rash   Pt does not remember, states she was real young      Medication List    TAKE these medications   alendronate 70 MG tablet Commonly known as: FOSAMAX Take 70 mg by mouth once a week.   ALPRAZolam 0.5 MG tablet Commonly known as: XANAX Take 0.25 mg by mouth at bedtime as needed for anxiety.   aspirin EC 81 MG tablet Take 1 tablet (81 mg total) by mouth 2 (two) times daily. What changed: when to take this   atorvastatin 40 MG tablet Commonly known as: LIPITOR Take 1 tablet (40 mg total) by mouth daily at 6 PM.   buPROPion 300 MG 24 hr tablet Commonly known as: WELLBUTRIN XL Take 300 mg by mouth daily.   CALTRATE 600+D PO Take 1 tablet by mouth daily.  cholecalciferol 25 MCG (1000 UNIT) tablet Commonly known as: VITAMIN D3 Take 1,000 Units by mouth daily.   ferrous sulfate 325 (65 FE) MG tablet Take 325 mg by mouth daily with breakfast.   metoprolol succinate 50 MG 24 hr tablet Commonly known as: TOPROL-XL Take 1 tablet (50 mg total) by mouth daily. Take with or immediately following a meal.   MULTIVITAMIN ADULT PO Take 1 tablet by mouth daily.   nitrofurantoin 100 MG capsule Commonly known as: MACRODANTIN Take 100 mg by mouth daily as needed (UTI).   nitroGLYCERIN 0.4 MG SL tablet Commonly known as:  NITROSTAT Place 1 tablet (0.4 mg total) under the tongue every 5 (five) minutes x 3 doses as needed for chest pain.   nystatin-triamcinolone cream Commonly known as: MYCOLOG II Apply 1 application topically daily as needed for rash.   oxyCODONE-acetaminophen 5-325 MG tablet Commonly known as: PERCOCET/ROXICET Take 1 tablet by mouth every 4 (four) hours as needed for severe pain.   sertraline 100 MG tablet Commonly known as: ZOLOFT Take 100 mg by mouth daily.   tiZANidine 2 MG tablet Commonly known as: ZANAFLEX Take 1 tablet (2 mg total) by mouth every 6 (six) hours as needed.            Discharge Care Instructions  (From admission, onward)         Start     Ordered   10/20/19 0000  Weight bearing as tolerated     10/20/19 1529          Diagnostic Studies: No results found.  Disposition: Discharge disposition: 01-Home or Self Care       Discharge Instructions    Call MD / Call 911   Complete by: As directed    If you experience chest pain or shortness of breath, CALL 911 and be transported to the hospital emergency room.  If you develope a fever above 101 F, pus (white drainage) or increased drainage or redness at the wound, or calf pain, call your surgeon's office.   Constipation Prevention   Complete by: As directed    Drink plenty of fluids.  Prune juice may be helpful.  You may use a stool softener, such as Colace (over the counter) 100 mg twice a day.  Use MiraLax (over the counter) for constipation as needed.   Diet - low sodium heart healthy   Complete by: As directed    Driving restrictions   Complete by: As directed    No driving for 2 weeks   Increase activity slowly as tolerated   Complete by: As directed    Patient may shower   Complete by: As directed    You may shower without a dressing once there is no drainage.  Do not wash over the wound.  If drainage remains, cover wound with plastic wrap and then shower.   Weight bearing as tolerated    Complete by: As directed       Follow-up Information    Frederik Pear, MD. Go on 11/02/2019.   Specialty: Orthopedic Surgery Why: Your appointment is scheduled for 8:45 Contact information: [place] [address] [phone]        [facility] Follow up.   Specialty: [place] Why: HHPT will see you at home prior to you starting outpatient physical therapy  Contact information: [address] [place] [address] [phone]        [facility], [place]. Go on 11/02/2019.  Why: You are scheduled to start outpatient physical therapy at 10:00 after your MD appointment. Please go over and complete your paperwork.  Contact information: Physical Therapy [place] [address] [phone]        Frederik Pear, MD In 2 weeks.   Specialty: Orthopedic Surgery Contact information: [place] [address] [phone]            Signed: Joanell Rising 10/26/2019, 2:31 PM

## 2019-10-29 ENCOUNTER — Ambulatory Visit: Payer: Medicare Other

## 2019-11-01 MED FILL — ALENDRONATE NA 70 MG TAB: 70 | 28 days supply | Qty: 4 | Fill #0

## 2019-11-01 MED FILL — NITROFURANTOIN MONO-MCR 100: 100 | 15 days supply | Qty: 30 | Fill #4

## 2019-11-01 MED FILL — ATORVASTATIN 40 MG TABLET: 40 | 30 days supply | Qty: 30 | Fill #4

## 2019-11-02 DIAGNOSIS — M25662 Stiffness of left knee, not elsewhere classified: Secondary | ICD-10-CM | POA: Diagnosis not present

## 2019-11-02 DIAGNOSIS — R531 Weakness: Secondary | ICD-10-CM | POA: Diagnosis not present

## 2019-11-02 DIAGNOSIS — M25562 Pain in left knee: Secondary | ICD-10-CM | POA: Diagnosis not present

## 2019-11-02 DIAGNOSIS — Z96652 Presence of left artificial knee joint: Secondary | ICD-10-CM | POA: Diagnosis not present

## 2019-11-02 MED FILL — oxyCODONE HCL 5 MG TABS: 5 | 10 days supply | Qty: 30 | Fill #0

## 2019-11-04 DIAGNOSIS — R531 Weakness: Secondary | ICD-10-CM | POA: Diagnosis not present

## 2019-11-04 DIAGNOSIS — Z96652 Presence of left artificial knee joint: Secondary | ICD-10-CM | POA: Diagnosis not present

## 2019-11-04 DIAGNOSIS — M25662 Stiffness of left knee, not elsewhere classified: Secondary | ICD-10-CM | POA: Diagnosis not present

## 2019-11-06 ENCOUNTER — Ambulatory Visit: Payer: Medicare Other | Attending: Internal Medicine

## 2019-11-06 DIAGNOSIS — Z23 Encounter for immunization: Secondary | ICD-10-CM | POA: Insufficient documentation

## 2019-11-06 NOTE — Progress Notes (Signed)
   Covid-19 Vaccination Clinic  Name:  Cynthia [name]    MRN: [id number] DOB: 13-Dec-1950  11/06/2019  Cynthia [name] was observed post Covid-19 immunization for 15 minutes without incidence. She was provided with Vaccine Information Sheet and instruction to access the V-Safe system.   Cynthia [name] was instructed to call 911 with any severe reactions post vaccine: Marland Kitchen Difficulty breathing  . Swelling of your face and throat  . A fast heartbeat  . A bad rash all over your body  . Dizziness and weakness    Immunizations Administered    Name Date Dose VIS Date Route   Pfizer COVID-19 Vaccine 11/06/2019  3:02 PM 0.3 mL 09/10/2019 Intramuscular   Manufacturer: [place]   Lot: [id number]   [place]: [id number]

## 2019-11-08 DIAGNOSIS — R531 Weakness: Secondary | ICD-10-CM | POA: Diagnosis not present

## 2019-11-08 DIAGNOSIS — Z96652 Presence of left artificial knee joint: Secondary | ICD-10-CM | POA: Diagnosis not present

## 2019-11-08 DIAGNOSIS — M25662 Stiffness of left knee, not elsewhere classified: Secondary | ICD-10-CM | POA: Diagnosis not present

## 2019-11-10 DIAGNOSIS — R531 Weakness: Secondary | ICD-10-CM | POA: Diagnosis not present

## 2019-11-10 DIAGNOSIS — M25662 Stiffness of left knee, not elsewhere classified: Secondary | ICD-10-CM | POA: Diagnosis not present

## 2019-11-10 DIAGNOSIS — Z96652 Presence of left artificial knee joint: Secondary | ICD-10-CM | POA: Diagnosis not present

## 2019-11-10 MED FILL — oxyCODONE HCL 5 MG TABS: 5 | 10 days supply | Qty: 30 | Fill #0

## 2019-11-11 MED FILL — SERTRALINE HCL 100 MG TAB: 100 | 90 days supply | Qty: 90 | Fill #0

## 2019-11-11 MED FILL — NYSTATIN-TRIAMCINOLONE CRM: 100000-0.1 | 15 days supply | Qty: 30 | Fill #2

## 2019-11-15 DIAGNOSIS — Z96652 Presence of left artificial knee joint: Secondary | ICD-10-CM | POA: Diagnosis not present

## 2019-11-15 DIAGNOSIS — R531 Weakness: Secondary | ICD-10-CM | POA: Diagnosis not present

## 2019-11-15 DIAGNOSIS — M25662 Stiffness of left knee, not elsewhere classified: Secondary | ICD-10-CM | POA: Diagnosis not present

## 2019-11-17 DIAGNOSIS — R531 Weakness: Secondary | ICD-10-CM | POA: Diagnosis not present

## 2019-11-17 DIAGNOSIS — Z96652 Presence of left artificial knee joint: Secondary | ICD-10-CM | POA: Diagnosis not present

## 2019-11-17 DIAGNOSIS — M25662 Stiffness of left knee, not elsewhere classified: Secondary | ICD-10-CM | POA: Diagnosis not present

## 2019-11-22 DIAGNOSIS — R531 Weakness: Secondary | ICD-10-CM | POA: Diagnosis not present

## 2019-11-22 DIAGNOSIS — Z96652 Presence of left artificial knee joint: Secondary | ICD-10-CM | POA: Diagnosis not present

## 2019-11-22 DIAGNOSIS — M25662 Stiffness of left knee, not elsewhere classified: Secondary | ICD-10-CM | POA: Diagnosis not present

## 2019-11-22 MED FILL — OXYCODONE-APAP 5-325MG: 5-325 | 10 days supply | Qty: 30 | Fill #0

## 2019-11-24 DIAGNOSIS — Z96652 Presence of left artificial knee joint: Secondary | ICD-10-CM | POA: Diagnosis not present

## 2019-11-24 DIAGNOSIS — R531 Weakness: Secondary | ICD-10-CM | POA: Diagnosis not present

## 2019-11-24 DIAGNOSIS — M25662 Stiffness of left knee, not elsewhere classified: Secondary | ICD-10-CM | POA: Diagnosis not present

## 2019-11-29 DIAGNOSIS — M25662 Stiffness of left knee, not elsewhere classified: Secondary | ICD-10-CM | POA: Diagnosis not present

## 2019-11-29 DIAGNOSIS — R531 Weakness: Secondary | ICD-10-CM | POA: Diagnosis not present

## 2019-11-29 DIAGNOSIS — Z96652 Presence of left artificial knee joint: Secondary | ICD-10-CM | POA: Diagnosis not present

## 2019-12-01 ENCOUNTER — Ambulatory Visit: Payer: Medicare Other | Attending: Internal Medicine

## 2019-12-01 DIAGNOSIS — M25662 Stiffness of left knee, not elsewhere classified: Secondary | ICD-10-CM | POA: Diagnosis not present

## 2019-12-01 DIAGNOSIS — Z23 Encounter for immunization: Secondary | ICD-10-CM

## 2019-12-01 DIAGNOSIS — Z96652 Presence of left artificial knee joint: Secondary | ICD-10-CM | POA: Diagnosis not present

## 2019-12-01 DIAGNOSIS — R531 Weakness: Secondary | ICD-10-CM | POA: Diagnosis not present

## 2019-12-01 NOTE — Progress Notes (Signed)
   Covid-19 Vaccination Clinic  Name:  Cynthia [name]    MRN: [id number] DOB: [birth year]  12/01/2019  [name] was observed post Covid-19 immunization for 15 minutes without incident. She was provided with Vaccine Information Sheet and instruction to access the V-Safe system.   [name] was instructed to call 911 with any severe reactions post vaccine: Marland Kitchen Difficulty breathing  . Swelling of face and throat  . A fast heartbeat  . A bad rash all over body  . Dizziness and weakness   Immunizations Administered    Name Date Dose VIS Date Route   Pfizer COVID-19 Vaccine 12/01/2019 10:37 AM 0.3 mL 09/10/2019 Intramuscular   Manufacturer: [place]   Lot: [id number]   [place]: [id number]

## 2019-12-06 DIAGNOSIS — M25662 Stiffness of left knee, not elsewhere classified: Secondary | ICD-10-CM | POA: Diagnosis not present

## 2019-12-06 DIAGNOSIS — R531 Weakness: Secondary | ICD-10-CM | POA: Diagnosis not present

## 2019-12-06 DIAGNOSIS — Z96652 Presence of left artificial knee joint: Secondary | ICD-10-CM | POA: Diagnosis not present

## 2019-12-06 MED FILL — ACETAMINOPHEN/COD #3 TABLET: 300-30 | 7 days supply | Qty: 30 | Fill #0

## 2019-12-08 DIAGNOSIS — R531 Weakness: Secondary | ICD-10-CM | POA: Diagnosis not present

## 2019-12-08 DIAGNOSIS — Z96652 Presence of left artificial knee joint: Secondary | ICD-10-CM | POA: Diagnosis not present

## 2019-12-08 DIAGNOSIS — M25662 Stiffness of left knee, not elsewhere classified: Secondary | ICD-10-CM | POA: Diagnosis not present

## 2019-12-08 MED FILL — ATORVASTATIN 40 MG TABLET: 40 | 30 days supply | Qty: 30 | Fill #5

## 2019-12-08 MED FILL — ALENDRONATE NA 70 MG TAB: 70 | 28 days supply | Qty: 4 | Fill #1

## 2019-12-09 MED FILL — METOPROLOL SUCCINATE ER 50: 50 | 90 days supply | Qty: 90 | Fill #1

## 2019-12-09 MED FILL — buPROPion HCL ER (XL) 300 M: 300 | 90 days supply | Qty: 90 | Fill #0

## 2019-12-13 DIAGNOSIS — M25662 Stiffness of left knee, not elsewhere classified: Secondary | ICD-10-CM | POA: Diagnosis not present

## 2019-12-13 DIAGNOSIS — Z96652 Presence of left artificial knee joint: Secondary | ICD-10-CM | POA: Diagnosis not present

## 2019-12-13 DIAGNOSIS — R531 Weakness: Secondary | ICD-10-CM | POA: Diagnosis not present

## 2019-12-14 DIAGNOSIS — Z1231 Encounter for screening mammogram for malignant neoplasm of breast: Secondary | ICD-10-CM | POA: Diagnosis not present

## 2019-12-14 DIAGNOSIS — Z6838 Body mass index (BMI) 38.0-38.9, adult: Secondary | ICD-10-CM | POA: Diagnosis not present

## 2019-12-14 DIAGNOSIS — Z01419 Encounter for gynecological examination (general) (routine) without abnormal findings: Secondary | ICD-10-CM | POA: Diagnosis not present

## 2019-12-16 DIAGNOSIS — R531 Weakness: Secondary | ICD-10-CM | POA: Diagnosis not present

## 2019-12-16 DIAGNOSIS — Z96652 Presence of left artificial knee joint: Secondary | ICD-10-CM | POA: Diagnosis not present

## 2019-12-16 DIAGNOSIS — M25662 Stiffness of left knee, not elsewhere classified: Secondary | ICD-10-CM | POA: Diagnosis not present

## 2019-12-20 ENCOUNTER — Encounter: Payer: Self-pay | Admitting: Cardiology

## 2019-12-20 ENCOUNTER — Ambulatory Visit: Payer: Medicare Other | Admitting: Cardiology

## 2019-12-20 ENCOUNTER — Other Ambulatory Visit: Payer: Self-pay

## 2019-12-20 VITALS — BP 138/72 | HR 60 | Temp 97.5°F | Ht 65.0 in | Wt 236.0 lb

## 2019-12-20 DIAGNOSIS — M25662 Stiffness of left knee, not elsewhere classified: Secondary | ICD-10-CM | POA: Diagnosis not present

## 2019-12-20 DIAGNOSIS — I2510 Atherosclerotic heart disease of native coronary artery without angina pectoris: Secondary | ICD-10-CM | POA: Diagnosis not present

## 2019-12-20 DIAGNOSIS — E785 Hyperlipidemia, unspecified: Secondary | ICD-10-CM | POA: Diagnosis not present

## 2019-12-20 DIAGNOSIS — Z96652 Presence of left artificial knee joint: Secondary | ICD-10-CM | POA: Diagnosis not present

## 2019-12-20 DIAGNOSIS — I248 Other forms of acute ischemic heart disease: Secondary | ICD-10-CM | POA: Diagnosis not present

## 2019-12-20 DIAGNOSIS — I471 Supraventricular tachycardia: Secondary | ICD-10-CM | POA: Diagnosis not present

## 2019-12-20 DIAGNOSIS — I10 Essential (primary) hypertension: Secondary | ICD-10-CM

## 2019-12-20 DIAGNOSIS — R531 Weakness: Secondary | ICD-10-CM | POA: Diagnosis not present

## 2019-12-20 NOTE — Progress Notes (Signed)
Primary Care Provider: Maurice Small, MD Cardiologist: Glenetta Hew, MD Electrophysiologist:   Clinic Note: Chief Complaint  Patient presents with  . Follow-up    Feeling well.  . Coronary Artery Disease    Moderate LAD disease.  Nonobstructive  . Tachycardia    History of PSVT   HPI:    Cynthia [name] is a 69 y.o. female with a PMH below who presents today for 36-month follow-up -> notable symptomatic SVT with post SVT EKG changes suggested inferior ST elevations.  Moderate disease in LAD noted..  August 24-25 2020: Initially presented to [facility] with chest pain dizziness with palpitations noted to be in SVT at rate of 85 bpm.  6 mg IV adenosine was administered converting to sinus rhythm, however EKG suggested inferior ST elevations.  She was therefore transferred as a code STEMI.  Noted maybe 60 to 65% proximal LAD stenosis but no occlusive disease.  Recommended medical management. ->  Started on Toprol as well as aspirin and statin, vagal maneuvers discussed.  Cynthia [name] was last seen on September 02, 2019 for follow-up and preop assessment for second knee surgery.  Significant stress as her sister had just passed away after long fight with chronic cancer-chemo with multiorgan failure and her father died in Sep 06, 2023 with complications after a fall (suffered a SAH).  She was noted to be hypertensive and had not taken her medications.   No recurrent cardiac symptoms.  Noted stiff right knee and left knee pain.  Cleared for surgery.  Restarted beta-blocker, aspirin and statin.  With no active anginal symptoms, decided to forego stress test.  Discussed vagal maneuvers for breakthrough SVT  Recent Hospitalizations:   October 20, 2019: Left knee total arthroplasty  Reviewed  CV studies:    The following studies were reviewed today: (if available, images/films reviewed: From Epic Chart or Care Everywhere)  None  Interval History:   Cynthia [name] now  presents following her second knee surgery indicating that she finally has less pain in her knees.  They are still a bit stiff, but the pain is notably improved.  She is not yet able to do lots of exercise, but is doing physical therapy and is enjoying the physical therapy.  She has gained increased range of motion and strength. With what she is able to do, she is not having any chest tightness pressure or dyspnea.  She is not had any more episodes of rapid heart rate since we increased her beta-blocker dose.  No skipping beats.  No syncope or near syncope.  No heart failure symptoms.  CV Review of Symptoms (Summary): no chest pain or dyspnea on exertion positive for - Rare skipped beats.  Not yet exercise enough to notice if she would have exertional dyspnea. negative for - edema, irregular heartbeat, orthopnea, paroxysmal nocturnal dyspnea, rapid heart rate, shortness of breath or Syncope/near syncope, TIA/amaurosis fugax, claudication.  The patient DOES NOT have symptoms concerning for COVID-19 infection (fever, chills, cough, or new shortness of breath).  The patient is practicing social distancing and masking.   She has completed both COVID-19 vaccines.  As a result she has felt more optimistic about going out for chores such as shopping, but is still limited postop.   REVIEWED OF SYSTEMS   A comprehensive ROS was performed. Review of Systems  Constitutional: Negative for malaise/fatigue (Gradually regaining energy) and weight loss.  HENT: Negative for congestion and nosebleeds.   Respiratory: Negative for cough, shortness of breath  and wheezing.   Cardiovascular: Positive for leg swelling (Postop swelling now on the left knee.  But much better.).  Gastrointestinal: Negative for blood in stool and melena.  Genitourinary: Negative for hematuria.  Musculoskeletal: Positive for joint pain (Right knee is doing much better.  Left knee is a little bit stiff but gradually getting better range  of motion.). Negative for falls.  Neurological: Negative for dizziness, weakness and headaches.  Endo/Heme/Allergies: Does not bruise/bleed easily.  Psychiatric/Behavioral: Negative for depression and memory loss. The patient is not nervous/anxious and does not have insomnia.   All other systems reviewed and are negative.  I have reviewed and (if needed) personally updated the patient's problem list, medications, allergies, past medical and surgical history, social and family history.   PAST MEDICAL HISTORY   Past Medical History:  Diagnosis Date  . Anemia   . Anxiety disorder   . Aortic valve sclerosis 05/24/2019   Moderate noted on ECHO  . Arthritis    right knee  . Cancer of the skin, basal cell   . Coronary artery disease    Moderate to severe, non-critical single-vessel coronary artery disease with 60-70% proximal LAD stenosis at origin of large diagonal branch ([place] 1,0,0).  . Demand ischemia ([place]) 05/24/2019  . Depression   . GERD (gastroesophageal reflux disease)    TUMS  . Hypertension   . Lateral meniscus derangement, right   . Raynaud's phenomenon    questionable because hands get white when cold  . SVT (supraventricular tachycardia) ([place])      PAST SURGICAL HISTORY   Past Surgical History:  Procedure Laterality Date  . CARDIOVERSION  05/24/2019   Adenosine  . CATARACT EXTRACTION, BILATERAL    . CESAREAN SECTION     x3  . CHONDROPLASTY Right 02/21/2017   Procedure: CHONDROPLASTY;  Surgeon: Melrose Nakayama, MD;  Location: [place];  Service: Orthopedics;  Laterality: Right;  . COLONOSCOPY    . KNEE ARTHROSCOPY WITH LATERAL MENISECTOMY Right 02/21/2017   Procedure: KNEE ARTHROSCOPY WITH LATERAL MENISECTOMY;  Surgeon: Melrose Nakayama, MD;  Location: [place];  Service: Orthopedics;  Laterality: Right;  . KNEE ARTHROSCOPY WITH MEDIAL MENISECTOMY Right 02/21/2017   Procedure: KNEE ARTHROSCOPY WITH MEDIAL MENISECTOMY;  Surgeon:  Melrose Nakayama, MD;  Location: [place];  Service: Orthopedics;  Laterality: Right;  . LEFT HEART CATH AND CORONARY ANGIOGRAPHY N/A 05/24/2019   Procedure: LEFT HEART CATH AND CORONARY ANGIOGRAPHY;  Surgeon: Nelva Bush, MD;  Location: [place] [facility];  Service: Cardiovascular;  Laterality: N/A;  . MOUTH SURGERY    . TOTAL KNEE ARTHROPLASTY Right 07/05/2019   Procedure: RIGHT TOTAL KNEE ARTHROPLASTY;  Surgeon: Frederik Pear, MD;  Location: [facility];  Service: Orthopedics;  Laterality: Right;  . TOTAL KNEE ARTHROPLASTY Left 10/20/2019   Procedure: LEFT TOTAL KNEE ARTHROPLASTY;  Surgeon: Frederik Pear, MD;  Location: [facility];  Service: Orthopedics;  Laterality: Left;  . Yag Capsulotomy Left 06/30/2019    Echocardiogram 05/24/2019: Normal LV size and function.  EF 55 to 60%.  No RWM A.  Moderate aortic sclerosis with no stenosis.  LEFT HEART CATH-CORONARY ANGIOGRAPHY 05/24/2019: Moderate-severe noncritical single-vessel CAD-60-70% proximal LAD at origin of large D1 ([place] 1, 10, 0)-recommend medical management. t     MEDICATIONS/ALLERGIES   Current Meds  Medication Sig  . ALPRAZolam (XANAX) 0.5 MG tablet Take 0.25 mg by mouth at bedtime as needed for anxiety.   Marland Kitchen aspirin EC 81 MG tablet Take 1 tablet (81  mg total) by mouth 2 (two) times daily.  Marland Kitchen atorvastatin (LIPITOR) 40 MG tablet Take 1 tablet (40 mg total) by mouth daily at 6 PM.  . buPROPion (WELLBUTRIN XL) 300 MG 24 hr tablet Take 300 mg by mouth daily.  . Calcium Carbonate-Vitamin D (CALTRATE 600+D PO) Take 1 tablet by mouth daily.  . ergocalciferol (VITAMIN D2) 1.25 MG (50000 UT) capsule Vitamin D2 1,250 mcg (50,000 unit) capsule  . ferrous sulfate 325 (65 FE) MG tablet Take 325 mg by mouth daily with breakfast.  . metoprolol succinate (TOPROL-XL) 50 MG 24 hr tablet Take 1 tablet (50 mg total) by mouth daily. Take with or immediately following a meal.  . Multiple Vitamins-Minerals (MULTIVITAMIN ADULT PO) Take 1  tablet by mouth daily.  . nitrofurantoin, macrocrystal-monohydrate, (MACROBID) 100 MG capsule Take 100 mg by mouth 2 (two) times daily.  . nitroGLYCERIN (NITROSTAT) 0.4 MG SL tablet Place 1 tablet (0.4 mg total) under the tongue every 5 (five) minutes x 3 doses as needed for chest pain.  Marland Kitchen nystatin-triamcinolone (MYCOLOG II) cream Apply 1 application topically daily as needed for rash.  . sertraline (ZOLOFT) 100 MG tablet Take 100 mg by mouth daily.     Allergies  Allergen Reactions  . Penicillins Rash    Pt does not remember, states she was real young     SOCIAL HISTORY/FAMILY HISTORY   Social History   Tobacco Use  . Smoking status: Never Smoker  . Smokeless tobacco: Never Used  Substance Use Topics  . Alcohol use: Not Currently    Comment: social  . Drug use: Never   Social History   Social History Narrative   Completed COVID-19 vaccine in February 2021    Family History family history includes Hypertension in her father.   OBJCTIVE -PE, EKG, labs   Wt Readings from Last 3 Encounters:  12/20/19 236 lb (107 kg)  10/20/19 229 lb 4.5 oz (104 kg)  10/18/19 230 lb (104.3 kg)    Physical Exam: BP 138/72   Pulse 60   Temp (!) 97.5 F (36.4 C)   Ht 5\' 5"  (1.651 m)   Wt 236 lb (107 kg)   SpO2 98%   BMI 39.27 kg/m  Physical Exam  Constitutional: She is oriented to person, place, and time. She appears well-developed and well-nourished.  Borderline morbidly obese woman.  No acute distress.  Well-groomed  HENT:  Head: Normocephalic and atraumatic.  Neck: No hepatojugular reflux and no JVD present. Carotid bruit is not present.  Cardiovascular: Normal rate, regular rhythm, S1 normal and S2 normal.  No extrasystoles are present. PMI is not displaced (Difficult to palpate). Exam reveals distant heart sounds and decreased pulses (1+ pedal pulses probably because of body habitus.). Exam reveals no gallop and no friction rub.  Murmur heard. High-pitched harsh  crescendo-decrescendo early systolic murmur is present with a grade of 1/6 at the upper right sternal border radiating to the neck. Pulmonary/Chest: Breath sounds normal. No respiratory distress. She has no wheezes. She has no rales.  Abdominal: Soft. Bowel sounds are normal. She exhibits no distension. There is no abdominal tenderness. There is no rebound.  Unable to assess HSM due to body habitus  Musculoskeletal:        General: No edema.     Cervical back: Normal range of motion and neck supple.  Neurological: She is alert and oriented to person, place, and time.  Psychiatric: She has a normal mood and affect. Her behavior is normal. Judgment  and thought content normal.  Vitals reviewed.   Adult ECG Report  Rate: 60;  Rhythm: normal sinus rhythm and Nonspecific ST and T wave changes.  Otherwise normal axis, intervals durations.;   Narrative Interpretation: Stable EKG.  Recent Labs:    Lab Results  Component Value Date   CHOL 141 09/07/2019   HDL 57 09/07/2019   LDLCALC 68 09/07/2019   TRIG 86 09/07/2019   CHOLHDL 2.5 09/07/2019   Lab Results  Component Value Date   CREATININE 0.65 10/21/2019   BUN 14 10/21/2019   NA 134 (L) 10/21/2019   K 3.9 10/21/2019   CL 101 10/21/2019   CO2 25 10/21/2019    ASSESSMENT/PLAN    Problem List Items Addressed This Visit    Coronary artery disease, 60% LAD prior to D1 - Primary (Chronic)    Single-vessel disease in the LAD that probably made her symptomatic in the setting of rapid SVT.  Plan for now try to avoid further SVT and to increase aggressive stress modification.  She is currently on increased dose of beta-blocker with less palpitations.  Is on modest dose atorvastatin and aspirin.  Monitor closely for signs of exertional dyspnea or chest pain once she starts getting normal bilateral knee surgery.      Relevant Orders   Lipid panel   Comprehensive metabolic panel   SVT (supraventricular tachycardia) (HCC) (Chronic)     Thankfully, no further episodes of SVT.  She is now on increased dose of beta-blocker.  We also talked vagal maneuvers.  Where she to have significant recurrence, may want to consider SVT ablation in light of her CAD issues.      Essential hypertension (Chronic)    Blood pressure is little elevated today, but relatively stable.  She is on Toprol 50. Monitor pressures closely.  Low threshold to consider additional therapy with probably amlodipine to avoid coronary spasm.      Relevant Orders   Lipid panel   Comprehensive metabolic panel   EKG [id number] (Completed)   Hyperlipidemia with target LDL less than 70 (Chronic)    LDL pretty much at goal on current dose of atorvastatin.  Monitor closely.  Target LDL direct less than 70 but preferably less than 50.      Relevant Orders   Lipid panel   Comprehensive metabolic panel   Demand ischemia ([place])    She did have significant EKG changes and elevated troponin in the setting of SVT.  Likely not able to get her native heart rate up that fast without SVT.  Continue beta-blocker and low threshold to consider dihydropyridine calcium channel blocker.          COVID-19 Education: The signs and symptoms of COVID-19 were discussed with the patient and how to seek care for testing (follow up with PCP or arrange E-visit).   The importance of social distancing was discussed today.  I spent a total of 18 minutes with the patient and chart review. >  50% of the time was spent in direct patient consultation.  Additional time spent with chart review (studies, outside notes, etc): 8 Total Time: 26 min  Current medicines are reviewed at length with the patient today.  (+/- concerns) n/a   Patient Instructions / Medication Changes & Studies & Tests Ordered   Patient Instructions  Medication Instructions:  No changes  *If you need a refill on your cardiac medications before your next appointment, please call your pharmacy*   Lab Work: cmp  Lipid- July 2021 If you have labs (blood work) drawn today and your tests are completely normal, you will receive your results only by: Marland Kitchen MyChart Message (if you have MyChart) OR . A paper copy in the mail If you have any lab test that is abnormal or we need to change your treatment, we will call you to review the results.   Testing/Procedures: Not needed   Follow-Up: At [facility], you and your health needs are our priority.  As part of our continuing mission to provide you with exceptional heart care, we have created designated Provider Care Teams.  These Care Teams include your primary Cardiologist (physician) and Advanced Practice Providers (APPs -  Physician Assistants and Nurse Practitioners) who all work together to provide you with the care you need, when you need it.    Your next appointment:   6 month(s)  The format for your next appointment:   In Person  Provider:   Glenetta Hew, MD   Other Instructions   Please pay attention if you have increase shortness of breath or chest discomfort  With activity  If so contact the office will move your appointment up from the recall Sept 2021    Studies Ordered:   Orders Placed This Encounter  Procedures  . Lipid panel  . Comprehensive metabolic panel  . EKG 12-Lead     Glenetta Hew, M.D., M.S. Interventional Cardiologist   Pager # [phone] Phone # [phone] [address] [place], [place] [address]   Thank you for choosing Heartcare at [facility]!!

## 2019-12-20 NOTE — Patient Instructions (Signed)
Medication Instructions:  No changes  *If you need a refill on your cardiac medications before your next appointment, please call your pharmacy*   Lab Work: cmp  Lipid- July 2021 If you have labs (blood work) drawn today and your tests are completely normal, you will receive your results only by: Marland Kitchen MyChart Message (if you have MyChart) OR . A paper copy in the mail If you have any lab test that is abnormal or we need to change your treatment, we will call you to review the results.   Testing/Procedures: Not needed   Follow-Up: At [facility], you and your health needs are our priority.  As part of our continuing mission to provide you with exceptional heart care, we have created designated Provider Care Teams.  These Care Teams include your primary Cardiologist (physician) and Advanced Practice Providers (APPs -  Physician Assistants and Nurse Practitioners) who all work together to provide you with the care you need, when you need it.    Your next appointment:   6 month(s)  The format for your next appointment:   In Person  Provider:   Glenetta Hew, MD   Other Instructions   Please pay attention if you have increase shortness of breath or chest discomfort  With activity  If so contact the office will move your appointment up from the recall Sept 2021

## 2019-12-21 MED FILL — NYSTATIN-TRIAMCINOLONE CRM: 100000-0.1 | 15 days supply | Qty: 30 | Fill #3

## 2019-12-21 MED FILL — NITROFURANTOIN MONO-MCR 100: 100 | 15 days supply | Qty: 30 | Fill #0

## 2019-12-23 ENCOUNTER — Encounter: Payer: Self-pay | Admitting: Cardiology

## 2019-12-23 DIAGNOSIS — Z96652 Presence of left artificial knee joint: Secondary | ICD-10-CM | POA: Diagnosis not present

## 2019-12-23 DIAGNOSIS — M25662 Stiffness of left knee, not elsewhere classified: Secondary | ICD-10-CM | POA: Diagnosis not present

## 2019-12-23 DIAGNOSIS — R531 Weakness: Secondary | ICD-10-CM | POA: Diagnosis not present

## 2019-12-23 NOTE — Assessment & Plan Note (Signed)
Thankfully, no further episodes of SVT.  She is now on increased dose of beta-blocker.  We also talked vagal maneuvers.  Where she to have significant recurrence, may want to consider SVT ablation in light of her CAD issues.

## 2019-12-23 NOTE — Assessment & Plan Note (Signed)
Single-vessel disease in the LAD that probably made her symptomatic in the setting of rapid SVT.  Plan for now try to avoid further SVT and to increase aggressive stress modification.  She is currently on increased dose of beta-blocker with less palpitations.  Is on modest dose atorvastatin and aspirin.  Monitor closely for signs of exertional dyspnea or chest pain once she starts getting normal bilateral knee surgery.

## 2019-12-24 ENCOUNTER — Encounter: Payer: Self-pay | Admitting: Cardiology

## 2019-12-24 NOTE — Assessment & Plan Note (Signed)
Blood pressure is little elevated today, but relatively stable.  She is on Toprol 50. Monitor pressures closely.  Low threshold to consider additional therapy with probably amlodipine to avoid coronary spasm.

## 2019-12-24 NOTE — Assessment & Plan Note (Signed)
She did have significant EKG changes and elevated troponin in the setting of SVT.  Likely not able to get her native heart rate up that fast without SVT.  Continue beta-blocker and low threshold to consider dihydropyridine calcium channel blocker.

## 2019-12-24 NOTE — Assessment & Plan Note (Signed)
LDL pretty much at goal on current dose of atorvastatin.  Monitor closely.  Target LDL direct less than 70 but preferably less than 50.

## 2019-12-27 MED FILL — ALPRAZolam 0.5 MG TABS: 0.5 | 90 days supply | Qty: 270 | Fill #0

## 2020-01-03 MED FILL — ALENDRONATE NA 70 MG TAB: 70 | 28 days supply | Qty: 4 | Fill #2

## 2020-01-04 ENCOUNTER — Other Ambulatory Visit: Payer: Self-pay | Admitting: Physician Assistant

## 2020-01-04 MED FILL — VIT D2 1.25 MG (50,000 UNIT: 1.25 MG | 84 days supply | Qty: 12 | Fill #1

## 2020-01-04 MED FILL — ATORVASTATIN 40 MG TABLET: 40 | 90 days supply | Qty: 90 | Fill #0

## 2020-01-06 IMAGING — DX PORTABLE CHEST - 1 VIEW
1 series · 1 of 1 positions shown · non-contrast
Comparison: No prior.

CLINICAL DATA: STEMI.

EXAM:
PORTABLE CHEST 1 VIEW

[chest ap]
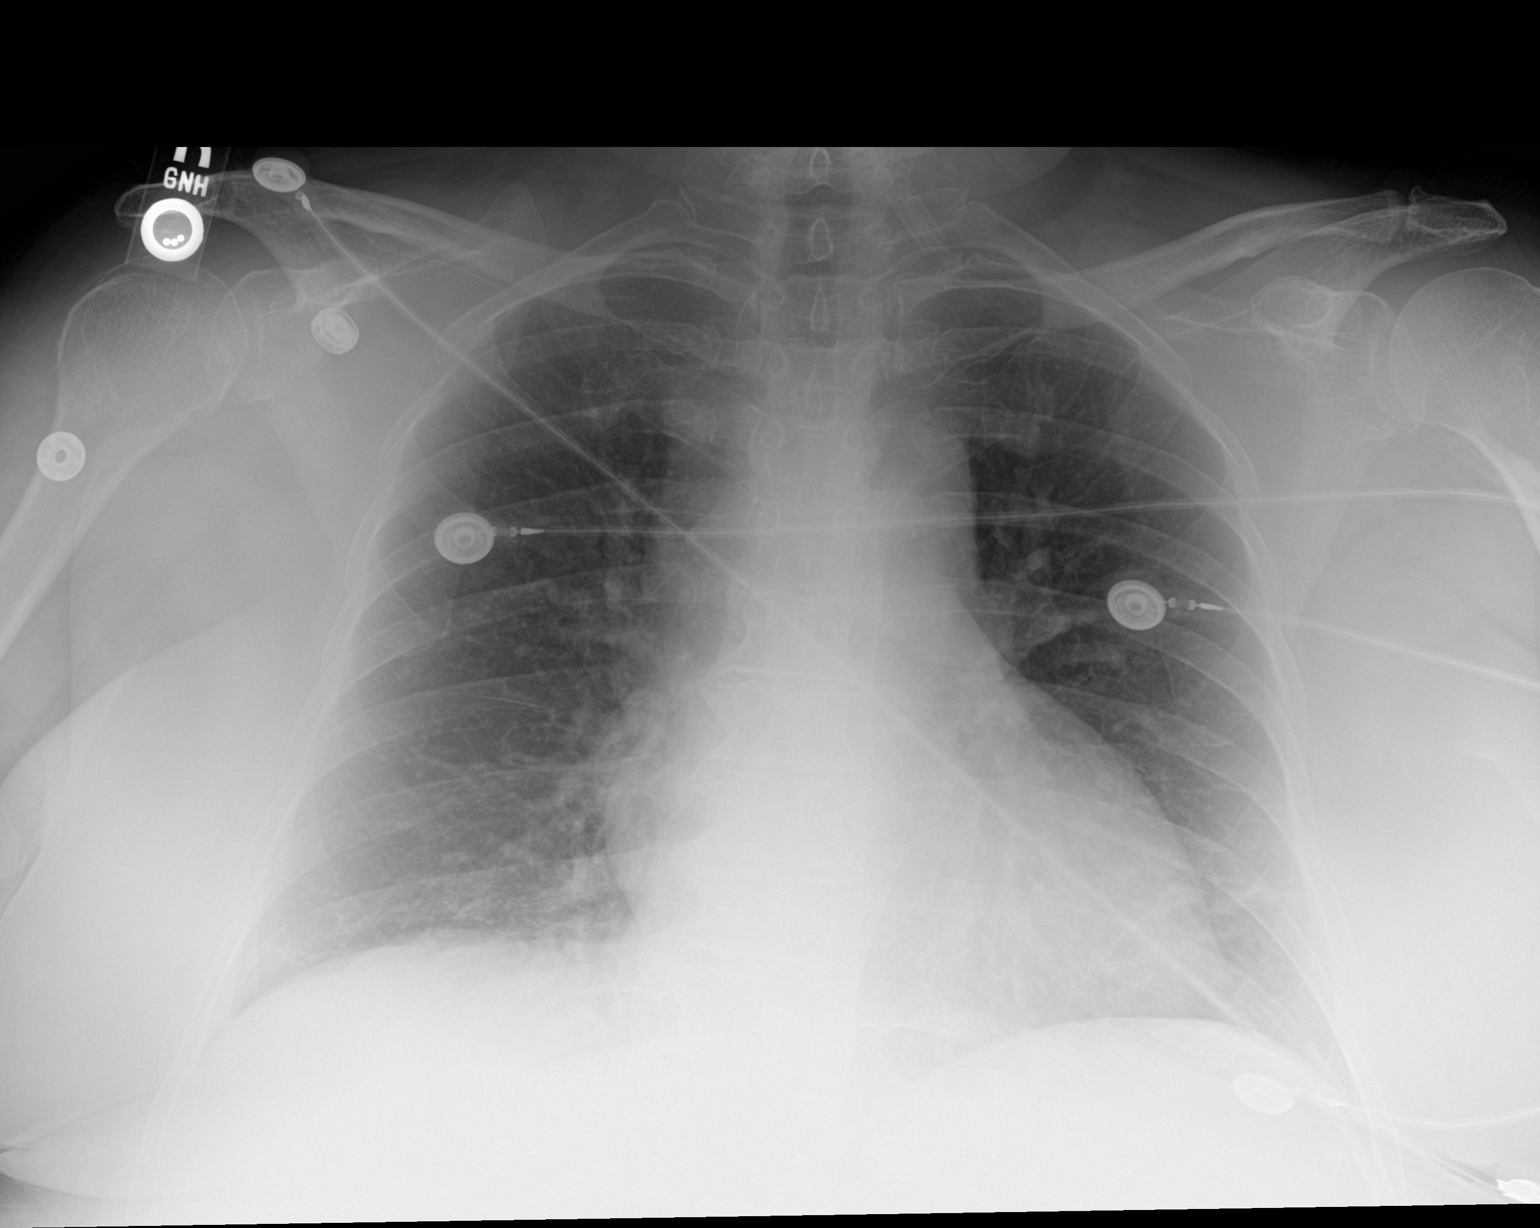

[1 of 1 positions shown; findings below may reference images not displayed]

FINDINGS: Mild widening of the upper mediastinum, this may be related to AP
portable technique. PA lateral chest x-ray suggested for further
evaluation. Mild cardiomegaly. Mild pulmonary venous congestion.
Mild bibasilar atelectasis. No focal alveolar infiltrate. No pleural
effusion or pneumothorax. No acute bony abnormality.
IMPRESSION: 1. Mild widening of the upper mediastinum, this may be related to AP
portable technique. PA and lateral chest x-ray suggested for further
evaluation.

2.  Mild cardiomegaly.  Mild pulmonary venous congestion.

3.  Mild bibasilar atelectasis.

## 2020-01-18 DIAGNOSIS — Z471 Aftercare following joint replacement surgery: Secondary | ICD-10-CM | POA: Diagnosis not present

## 2020-01-18 DIAGNOSIS — Z96651 Presence of right artificial knee joint: Secondary | ICD-10-CM | POA: Diagnosis not present

## 2020-01-18 DIAGNOSIS — Z96652 Presence of left artificial knee joint: Secondary | ICD-10-CM | POA: Diagnosis not present

## 2020-01-21 MED FILL — SERTRALINE HCL 100 MG TAB: 100 | 90 days supply | Qty: 90 | Fill #1

## 2020-01-26 DIAGNOSIS — D225 Melanocytic nevi of trunk: Secondary | ICD-10-CM | POA: Diagnosis not present

## 2020-01-26 DIAGNOSIS — D1801 Hemangioma of skin and subcutaneous tissue: Secondary | ICD-10-CM | POA: Diagnosis not present

## 2020-01-26 DIAGNOSIS — Z85828 Personal history of other malignant neoplasm of skin: Secondary | ICD-10-CM | POA: Diagnosis not present

## 2020-01-26 DIAGNOSIS — L820 Inflamed seborrheic keratosis: Secondary | ICD-10-CM | POA: Diagnosis not present

## 2020-02-01 MED FILL — ALENDRONATE NA 70 MG TAB: 70 | 28 days supply | Qty: 4 | Fill #3

## 2020-02-17 IMAGING — DX DG CHEST 2V
2 series · 2 of 2 positions shown · non-contrast
Comparison: 05/24/2019

CLINICAL DATA: Preoperative chest x-ray.

EXAM:
CHEST - 2 VIEW

[chest pa]
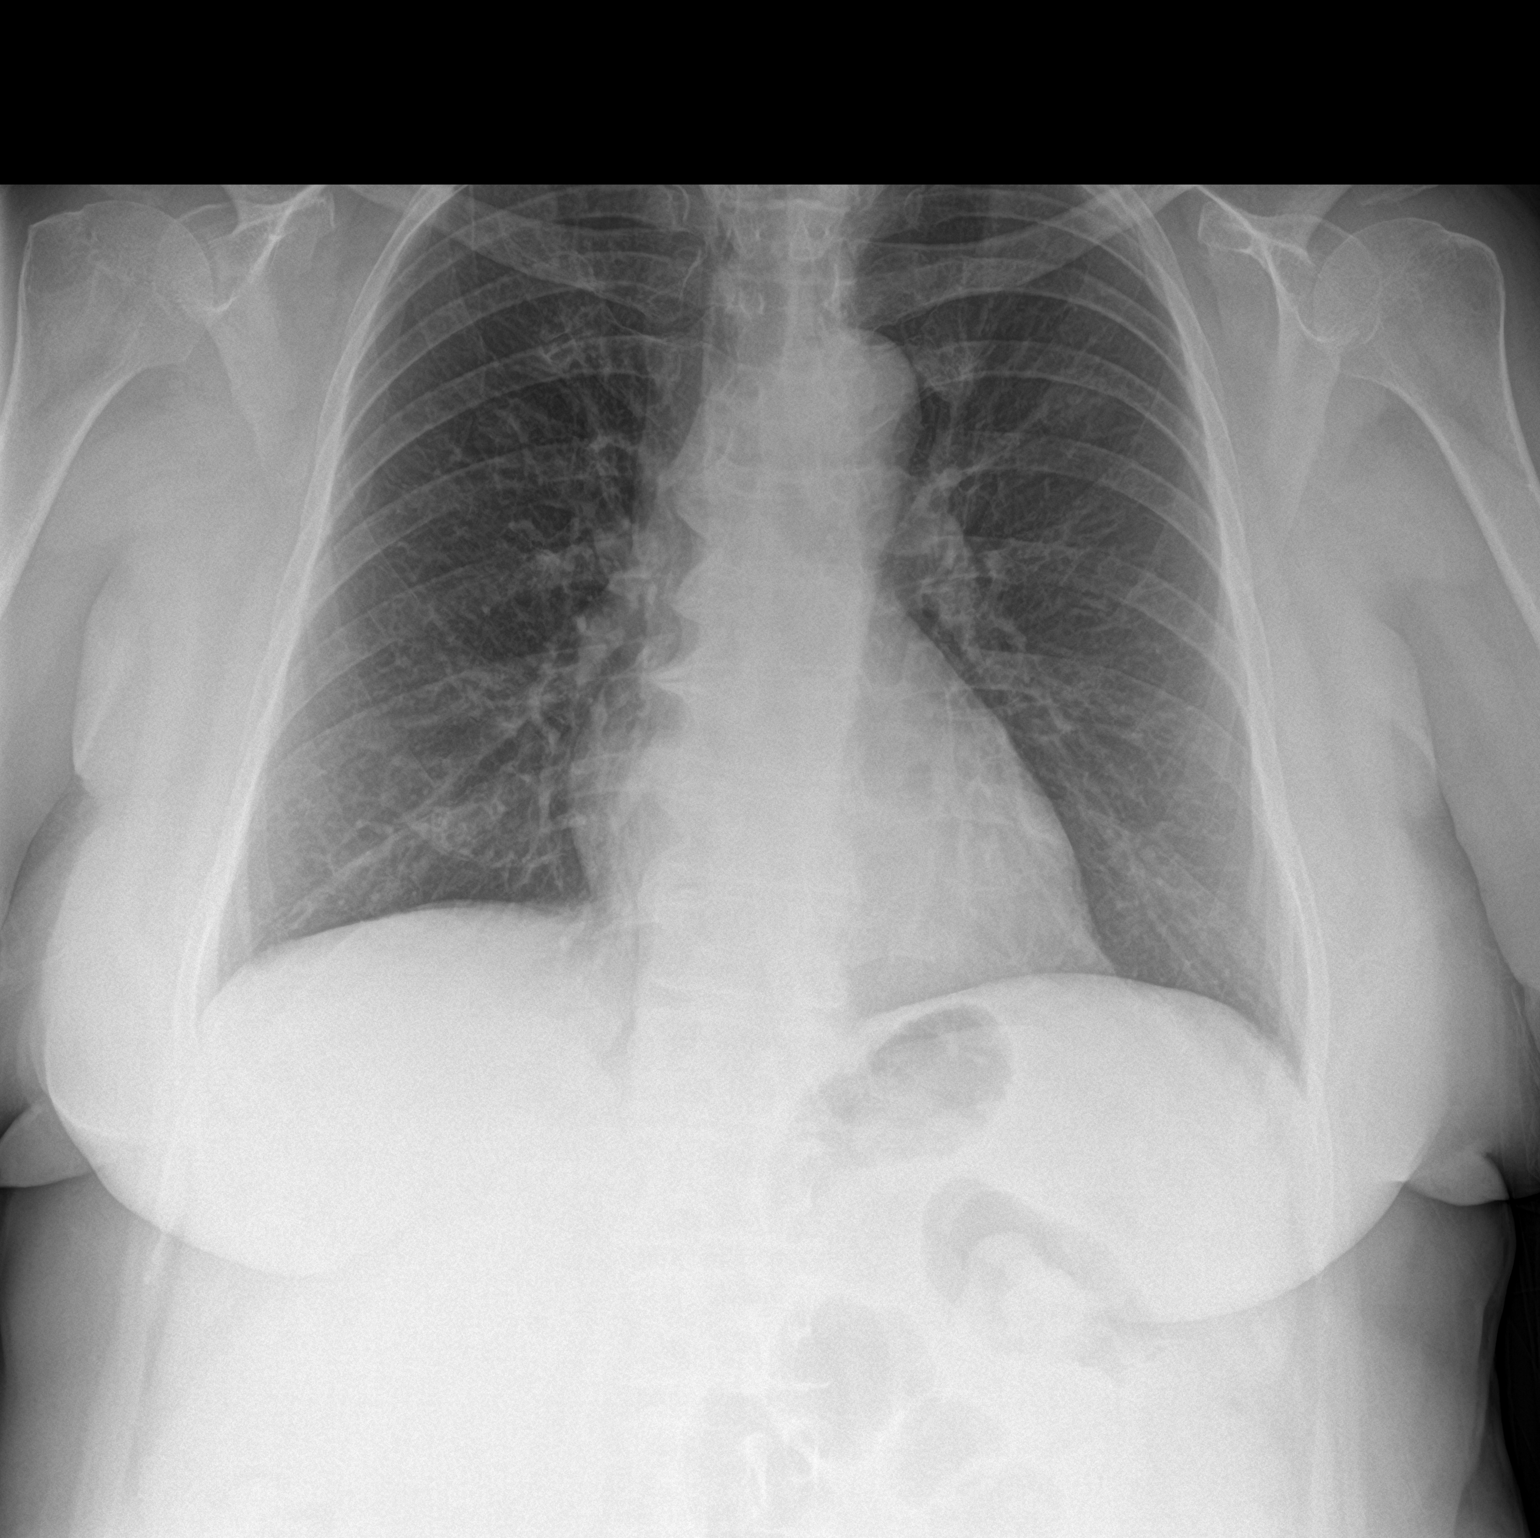

[chest lat]
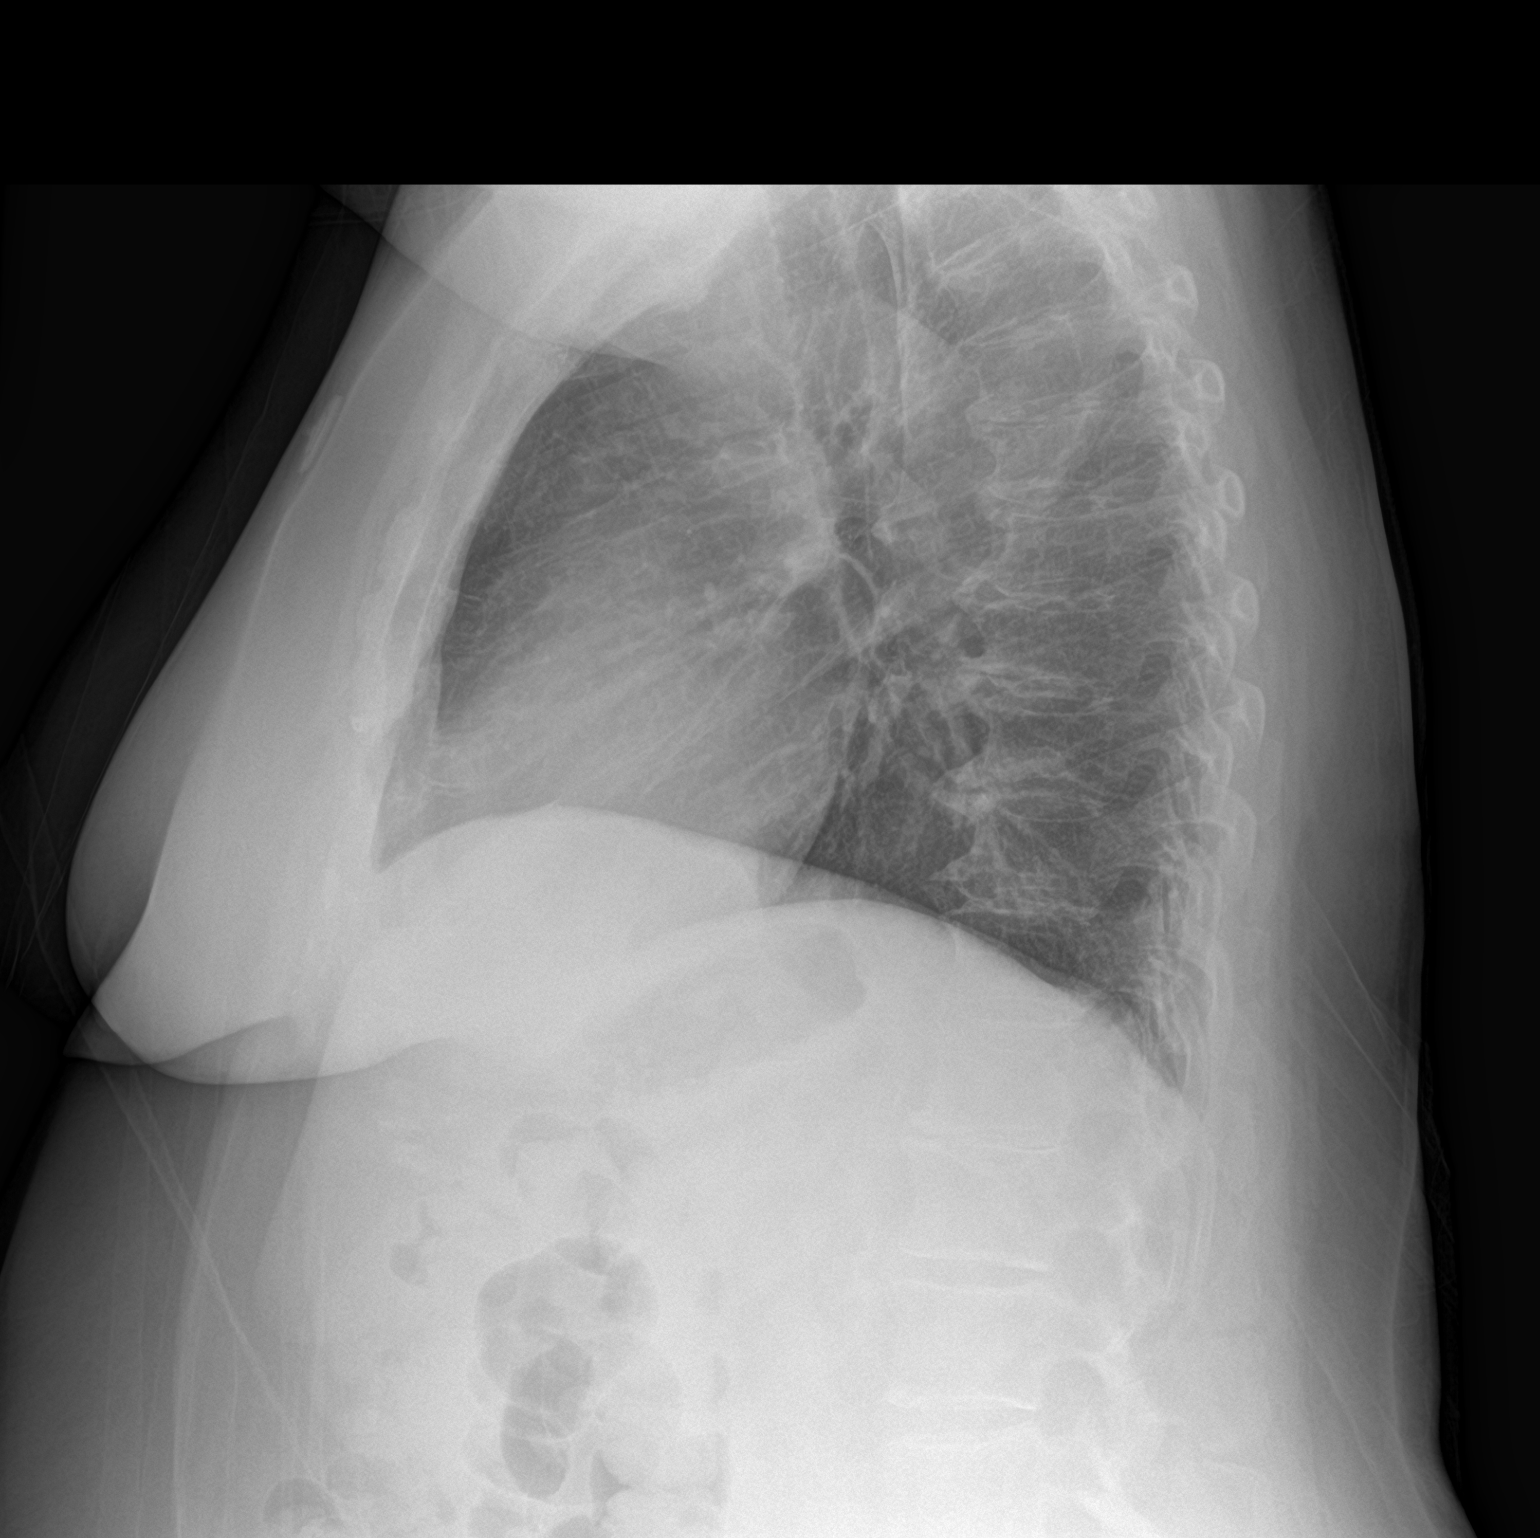

[2 of 2 positions shown; findings below may reference images not displayed]

FINDINGS: The heart size and mediastinal contours are within normal limits.
Both lungs are clear. Degenerative changes present in the spine.
IMPRESSION: No active cardiopulmonary disease.

## 2020-02-23 MED FILL — buPROPion HCL ER (XL) 300 M: 300 | 90 days supply | Qty: 90 | Fill #1

## 2020-03-21 DIAGNOSIS — Z96651 Presence of right artificial knee joint: Secondary | ICD-10-CM | POA: Diagnosis not present

## 2020-03-21 DIAGNOSIS — Z96652 Presence of left artificial knee joint: Secondary | ICD-10-CM | POA: Diagnosis not present

## 2020-03-21 MED FILL — ALENDRONATE NA 70 MG TAB: 70 | 28 days supply | Qty: 4 | Fill #0

## 2020-03-21 MED FILL — NITROFURANTOIN MONO-MCR 100: 100 | 15 days supply | Qty: 30 | Fill #1

## 2020-03-30 MED FILL — ATORVASTATIN CALCIUM 40 MG: 40 | 90 days supply | Qty: 90 | Fill #1

## 2020-03-30 MED FILL — ALPRAZolam 0.5 MG TABS: 0.5 | 90 days supply | Qty: 270 | Fill #1

## 2020-04-10 DIAGNOSIS — I10 Essential (primary) hypertension: Secondary | ICD-10-CM | POA: Diagnosis not present

## 2020-04-10 DIAGNOSIS — E785 Hyperlipidemia, unspecified: Secondary | ICD-10-CM | POA: Diagnosis not present

## 2020-04-10 DIAGNOSIS — I2510 Atherosclerotic heart disease of native coronary artery without angina pectoris: Secondary | ICD-10-CM | POA: Diagnosis not present

## 2020-04-10 LAB — COMPREHENSIVE METABOLIC PANEL
ALT: 20 IU/L (ref 0–32)
AST: 18 IU/L (ref 0–40)
Albumin/Globulin Ratio: 2 (ref 1.2–2.2)
Albumin: 4.3 g/dL (ref 3.8–4.8)
Alkaline Phosphatase: 78 IU/L (ref 48–121)
BUN/Creatinine Ratio: 27 (ref 12–28)
BUN: 22 mg/dL (ref 8–27)
Bilirubin Total: 0.9 mg/dL (ref 0.0–1.2)
CO2: 25 mmol/L (ref 20–29)
Calcium: 10.4 mg/dL — ABNORMAL HIGH (ref 8.7–10.3)
Chloride: 103 mmol/L (ref 96–106)
Creatinine, Ser: 0.82 mg/dL (ref 0.57–1.00)
GFR calc Af Amer: 84 mL/min/{1.73_m2} (ref >59)
GFR calc non Af Amer: 73 mL/min/{1.73_m2} (ref >59)
Globulin, Total: 2.2 g/dL (ref 1.5–4.5)
Glucose: 108 mg/dL — ABNORMAL HIGH (ref 65–99)
Potassium: 5 mmol/L (ref 3.5–5.2)
Sodium: 141 mmol/L (ref 134–144)
Total Protein: 6.5 g/dL (ref 6.0–8.5)

## 2020-04-10 LAB — LIPID PANEL
Chol/HDL Ratio: 2.3 ratio (ref 0.0–4.4)
Cholesterol, Total: 145 mg/dL (ref 100–199)
HDL: 62 mg/dL (ref >39)
LDL Chol Calc (NIH): 67 mg/dL (ref 0–99)
Triglycerides: 86 mg/dL (ref 0–149)
VLDL Cholesterol Cal: 16 mg/dL (ref 5–40)

## 2020-04-28 MED FILL — ALENDRONATE NA 70 MG TAB: 70 | 28 days supply | Qty: 4 | Fill #1

## 2020-04-28 MED FILL — NITROFURANTOIN MONO-MCR 100: 100 | 15 days supply | Qty: 30 | Fill #2

## 2020-04-28 MED FILL — SERTRALINE HCL 100 MG TABS: 100 | 90 days supply | Qty: 90 | Fill #2

## 2020-05-23 MED FILL — buPROPion HCL ER (XL) 300 M: 300 | 90 days supply | Qty: 90 | Fill #2

## 2020-05-29 MED FILL — ALENDRONATE NA 70 MG TAB: 70 | 28 days supply | Qty: 4 | Fill #2

## 2020-06-02 ENCOUNTER — Ambulatory Visit: Payer: Self-pay

## 2020-06-13 DIAGNOSIS — Z23 Encounter for immunization: Secondary | ICD-10-CM | POA: Diagnosis not present

## 2020-06-19 ENCOUNTER — Other Ambulatory Visit: Payer: Self-pay

## 2020-06-19 ENCOUNTER — Encounter: Payer: Self-pay | Admitting: Cardiology

## 2020-06-19 ENCOUNTER — Other Ambulatory Visit: Payer: Self-pay | Admitting: Cardiology

## 2020-06-19 ENCOUNTER — Telehealth: Payer: Self-pay | Admitting: *Deleted

## 2020-06-19 ENCOUNTER — Ambulatory Visit: Payer: Medicare Other | Admitting: Cardiology

## 2020-06-19 VITALS — BP 122/60 | HR 62 | Ht 65.0 in | Wt 224.0 lb

## 2020-06-19 DIAGNOSIS — I2510 Atherosclerotic heart disease of native coronary artery without angina pectoris: Secondary | ICD-10-CM | POA: Diagnosis not present

## 2020-06-19 DIAGNOSIS — I10 Essential (primary) hypertension: Secondary | ICD-10-CM | POA: Diagnosis not present

## 2020-06-19 DIAGNOSIS — E785 Hyperlipidemia, unspecified: Secondary | ICD-10-CM | POA: Diagnosis not present

## 2020-06-19 DIAGNOSIS — I471 Supraventricular tachycardia: Secondary | ICD-10-CM

## 2020-06-19 MED ORDER — METOPROLOL SUCCINATE ER 50 MG PO TB24: 50.0000 mg | ORAL_TABLET | Freq: Every day | ORAL | 3 refills | Status: DC

## 2020-06-19 MED ORDER — ATORVASTATIN CALCIUM 40 MG PO TABS: ORAL_TABLET | ORAL | 3 refills | Status: DC

## 2020-06-19 MED FILL — ATORVASTATIN CALCIUM 40 MG: 40 | 90 days supply | Qty: 90 | Fill #0

## 2020-06-19 NOTE — Telephone Encounter (Signed)
PATIENT REQUEST  LAST SET OF LABS TO BE FAXED TO PRIMARY FOR DEC 2021 APPOINTMENT.  ROUTED 03/2020 LABS TO DR CAROL WEBB'S OFFICE .

## 2020-06-19 NOTE — Progress Notes (Signed)
Primary Care Provider: Maurice Small, MD Cardiologist: Glenetta Hew, MD Electrophysiologist:   Clinic Note: Chief Complaint  Patient presents with  . Follow-up    6 months  . Tachycardia    History of SVT, no breakthrough  . Coronary Artery Disease    Moderate-nonocclusive LAD disease on cath   HPI:    Cynthia [name] is a 69 y.o. female with a PMH notable for symptomatic SVT and moderate LAD disease who presents today for 6 month follow-up.  August 24-25 2020: Initially presented to [facility] with chest pain dizziness with palpitations noted to be in SVT at rate of 85 bpm.  6 mg IV adenosine was administered converting to sinus rhythm, however EKG suggested inferior ST elevations.  She was therefore transferred as a code STEMI.  Noted maybe 60 to 65% proximal LAD stenosis but no occlusive disease.  Recommended medical management. ->  Started on Toprol as well as aspirin and statin, vagal maneuvers discussed. September 02, 2019 for follow-up and preop assessment for second knee surgery.  Significant stress as her sister had just passed away after long fight with chronic cancer-chemo with multiorgan failure and her father died in 09-06-2023 with complications after a fall (suffered a SAH).  She was noted to be hypertensive and had not taken her medications.   No recurrent cardiac symptoms.  Noted stiff right knee and left knee pain.  Cleared for surgery.  Restarted beta-blocker, aspirin and statin.  With no active anginal symptoms, decided to forego stress test.  Discussed vagal maneuvers for breakthrough SVT  Cynthia [name] was last seen on December 20, 2019  Recent Hospitalizations:   None  Reviewed  CV studies:    The following studies were reviewed today: (if available, images/films reviewed: From Epic Chart or Care Everywhere)  None  Interval History:   Cynthia [name] presents for 22-month follow-up doing pretty well.  She has been gradually recovering from  her knee surgery and is trying to lose weight now.  She is doing more walking.  The knee is doing a whole lot better.  She is doing therapy at the [facility] as much as he can.  She is trying to lose weight knowing that that will also help her knee.  She definitely feels better is able to walk a whole lot more now that her knee is better.  She is scheduled for COVID-19 booster on October 12 and is quite excited about that.  She is not having any recurrent episodes of tachycardia with rest or exertion.  And thankfully, with her exercise she is able to get her heart rate up to 110s without any difficulty.  No fatigue.  No significant skipped beats or palpitations.  No lightheadedness dizziness or syncope/near syncope.  She is walking more no chest pain or pressure noted.  Still is little deconditioned so she gets short of breath with increased exertion.  She is not having any chest pain or pressure with rest or exertion.  She was little bit concerned about the lesion that was seen on her heart cath and we reviewed symptoms to be concerned about.  CV Review of Symptoms (Summary): no chest pain or dyspnea on exertion positive for - shortness of breath and some DOE with being deconditioned  negative for - edema, irregular heartbeat, orthopnea, palpitations, paroxysmal nocturnal dyspnea, rapid heart rate, shortness of breath or Syncope/near syncope, TIA/amaurosis fugax, claudication.  The patient DOES NOT have symptoms concerning for COVID-19 infection (fever,  chills, cough, or new shortness of breath).  The patient is practicing social distancing and masking.   She has completed both COVID-19 vaccines.  -Is pending booster shot on October 12.   REVIEWED OF SYSTEMS   A comprehensive ROS was performed. Review of Systems  Constitutional: Negative for malaise/fatigue (Gradually regaining energy) and weight loss.  HENT: Negative for congestion and nosebleeds.   Respiratory: Negative for cough, shortness of  breath and wheezing.   Cardiovascular: Positive for leg swelling (Postop swelling now on the left knee.  But much better.).  Gastrointestinal: Negative for blood in stool and melena.  Genitourinary: Negative for hematuria.  Musculoskeletal: Positive for joint pain (Right knee is doing much better.  Left knee is a little bit stiff but gradually getting better range of motion.). Negative for falls.  Neurological: Negative for dizziness, weakness and headaches.  Endo/Heme/Allergies: Does not bruise/bleed easily.  Psychiatric/Behavioral: Negative for depression and memory loss. The patient is not nervous/anxious and does not have insomnia.   All other systems reviewed and are negative.  I have reviewed and (if needed) personally updated the patient's problem list, medications, allergies, past medical and surgical history, social and family history.   PAST MEDICAL HISTORY   Past Medical History:  Diagnosis Date  . Anemia   . Anxiety disorder   . Aortic valve sclerosis 05/24/2019   Moderate noted on ECHO  . Arthritis    right knee  . Cancer of the skin, basal cell   . Coronary artery disease    Moderate to severe, non-critical single-vessel coronary artery disease with 60-70% proximal LAD stenosis at origin of large diagonal branch ([place] 1,0,0).  . Demand ischemia ([place]) 05/24/2019  . Depression   . GERD (gastroesophageal reflux disease)    TUMS  . Hypertension   . Lateral meniscus derangement, right   . Raynaud's phenomenon    questionable because hands get white when cold  . SVT (supraventricular tachycardia) ([place])     PAST SURGICAL HISTORY   Past Surgical History:  Procedure Laterality Date  . CARDIOVERSION  05/24/2019   Adenosine  . CATARACT EXTRACTION, BILATERAL    . CESAREAN SECTION     x3  . CHONDROPLASTY Right 02/21/2017   Procedure: CHONDROPLASTY;  Surgeon: Melrose Nakayama, MD;  Location: [place];  Service: Orthopedics;  Laterality: Right;  .  COLONOSCOPY    . KNEE ARTHROSCOPY WITH LATERAL MENISECTOMY Right 02/21/2017   Procedure: KNEE ARTHROSCOPY WITH LATERAL MENISECTOMY;  Surgeon: Melrose Nakayama, MD;  Location: [place];  Service: Orthopedics;  Laterality: Right;  . KNEE ARTHROSCOPY WITH MEDIAL MENISECTOMY Right 02/21/2017   Procedure: KNEE ARTHROSCOPY WITH MEDIAL MENISECTOMY;  Surgeon: Melrose Nakayama, MD;  Location: [place];  Service: Orthopedics;  Laterality: Right;  . LEFT HEART CATH AND CORONARY ANGIOGRAPHY N/A 05/24/2019   Procedure: LEFT HEART CATH AND CORONARY ANGIOGRAPHY;  Surgeon: Nelva Bush, MD;  Location: [place] [facility];  Service: Cardiovascular;  Laterality: N/A;  . MOUTH SURGERY    . TOTAL KNEE ARTHROPLASTY Right 07/05/2019   Procedure: RIGHT TOTAL KNEE ARTHROPLASTY;  Surgeon: Frederik Pear, MD;  Location: [facility];  Service: Orthopedics;  Laterality: Right;  . TOTAL KNEE ARTHROPLASTY Left 10/20/2019   Procedure: LEFT TOTAL KNEE ARTHROPLASTY;  Surgeon: Frederik Pear, MD;  Location: [facility];  Service: Orthopedics;  Laterality: Left;  . Yag Capsulotomy Left 06/30/2019    Echocardiogram 05/24/2019: Normal LV size and function.  EF 55 to 60%.  No RWM A.  Moderate aortic sclerosis with no stenosis.  LEFT HEART CATH-CORONARY ANGIOGRAPHY 05/24/2019: Moderate-severe noncritical single-vessel CAD-60-70% proximal LAD at origin of large D1 ([place] 1, 10, 0)-recommend medical management. t   MEDICATIONS/ALLERGIES   Current Meds  Medication Sig  . ALPRAZolam (XANAX) 0.5 MG tablet Take 0.25 mg by mouth at bedtime as needed for anxiety.   Marland Kitchen aspirin EC 81 MG tablet Take 81 mg by mouth daily.  Marland Kitchen atorvastatin (LIPITOR) 40 MG tablet TAKE 1 TABLET BY MOUTH DAILY AT 6 PM.  . buPROPion (WELLBUTRIN XL) 300 MG 24 hr tablet Take 300 mg by mouth daily.  . Calcium Carbonate-Vitamin D (CALTRATE 600+D PO) Take 1 tablet by mouth daily.  . ergocalciferol (VITAMIN D2) 1.25 MG (50000 UT) capsule Vitamin D2  1,250 mcg (50,000 unit) capsule  . ferrous sulfate 325 (65 FE) MG tablet Take 325 mg by mouth daily with breakfast.  . metoprolol succinate (TOPROL-XL) 50 MG 24 hr tablet Take 1 tablet (50 mg total) by mouth daily. Take with or immediately following a meal.  . Multiple Vitamins-Minerals (MULTIVITAMIN ADULT PO) Take 1 tablet by mouth daily.  . nitrofurantoin, macrocrystal-monohydrate, (MACROBID) 100 MG capsule Take 100 mg by mouth 2 (two) times daily.  . nitroGLYCERIN (NITROSTAT) 0.4 MG SL tablet Place 1 tablet (0.4 mg total) under the tongue every 5 (five) minutes x 3 doses as needed for chest pain.  Marland Kitchen nystatin-triamcinolone (MYCOLOG II) cream Apply 1 application topically daily as needed for rash.  . sertraline (ZOLOFT) 100 MG tablet Take 100 mg by mouth daily.   . [DISCONTINUED] aspirin EC 81 MG tablet Take 1 tablet (81 mg total) by mouth 2 (two) times daily. (Patient taking differently: Take 81 mg by mouth daily. )  . [DISCONTINUED] atorvastatin (LIPITOR) 40 MG tablet TAKE 1 TABLET BY MOUTH DAILY AT 6 PM.  . [DISCONTINUED] metoprolol succinate (TOPROL-XL) 50 MG 24 hr tablet Take 1 tablet (50 mg total) by mouth daily. Take with or immediately following a meal.    Allergies  Allergen Reactions  . Penicillins Rash    Pt does not remember, states she was real young     SOCIAL HISTORY/FAMILY HISTORY   Social History   Tobacco Use  . Smoking status: Never Smoker  . Smokeless tobacco: Never Used  Vaping Use  . Vaping Use: Never used  Substance Use Topics  . Alcohol use: Not Currently    Comment: social  . Drug use: Never   Social History   Social History Narrative   Completed COVID-19 vaccine in February 2021    Family History family history includes Hypertension in her father.   OBJCTIVE -PE, EKG, labs   Wt Readings from Last 3 Encounters:  06/19/20 224 lb (101.6 kg)  12/20/19 236 lb (107 kg)  10/20/19 229 lb 4.5 oz (104 kg)    Physical Exam: BP 122/60   Pulse 62    Ht 5\' 5"  (1.651 m)   Wt 224 lb (101.6 kg)   SpO2 95%   BMI 37.28 kg/m  Physical Exam Vitals reviewed.  Constitutional:      Appearance: Normal appearance. She is well-developed. She is obese. She is not ill-appearing or toxic-appearing.     Comments: Borderline morbidly obese woman.  No acute distress.  Well-groomed  HENT:     Head: Normocephalic and atraumatic.  Neck:     Vascular: No carotid bruit, hepatojugular reflux or JVD.  Cardiovascular:     Rate and Rhythm: Normal rate and regular rhythm.  No  extrasystoles are present.    Chest Wall: PMI is not displaced (Difficult to palpate).     Pulses: Decreased pulses (1+ pedal pulses probably because of body habitus.).     Heart sounds: S1 normal and S2 normal. Heart sounds are distant. Murmur heard. High-pitched harsh crescendo-decrescendo early systolic murmur is present with a grade of 1/6 at the upper right sternal border radiating to the neck.  No friction rub. No gallop.   Pulmonary:     Effort: Pulmonary effort is normal. No respiratory distress.     Breath sounds: Normal breath sounds. No wheezing or rales.  Abdominal:     Comments:    Musculoskeletal:        General: No swelling. Normal range of motion.     Cervical back: Normal range of motion and neck supple.  Neurological:     General: No focal deficit present.     Mental Status: She is alert and oriented to person, place, and time.  Psychiatric:        Behavior: Behavior normal.        Thought Content: Thought content normal.        Judgment: Judgment normal.     Adult ECG Report  Rate: 60;  Rhythm: normal sinus rhythm and Nonspecific ST and T wave changes.  Otherwise normal axis, intervals durations.;   Narrative Interpretation: Stable EKG.  Recent Labs:    Lab Results  Component Value Date   CHOL 145 04/10/2020   HDL 62 04/10/2020   LDLCALC 67 04/10/2020   TRIG 86 04/10/2020   CHOLHDL 2.3 04/10/2020   Lab Results  Component Value Date   CREATININE  0.82 04/10/2020   BUN 22 04/10/2020   NA 141 04/10/2020   K 5.0 04/10/2020   CL 103 04/10/2020   CO2 25 04/10/2020    ASSESSMENT/PLAN    Problem List Items Addressed This Visit    Coronary artery disease, 60% LAD prior to D1 - Primary (Chronic)    Single vessel CAD of the LAD that was not visualized significant on cath.  However with significantly rapid SVT she was symptomatic.  This could have been possibly spasm related as well.  Plan: We will continue rate control with Toprol.  I did recommend that she continue take her statin. Plan she is on aspirin which makes sense, but okay to hold if she is to be taken NSAIDs.  Thankfully, she is not having any exertional chest tightness or dyspnea now that her knee has better.  Continue to monitor but would probably not consider intervention unless she would have significant anginal symptoms.      Relevant Medications   atorvastatin (LIPITOR) 40 MG tablet   aspirin EC 81 MG tablet   metoprolol succinate (TOPROL-XL) 50 MG 24 hr tablet   SVT (supraventricular tachycardia) (H[facility]) (Chronic)    No breakthrough episodes.  We discussed vagal maneuvers. She is on Toprol and well-controlled.      Relevant Medications   atorvastatin (LIPITOR) 40 MG tablet   aspirin EC 81 MG tablet   metoprolol succinate (TOPROL-XL) 50 MG 24 hr tablet   Essential hypertension (Chronic)    Blood pressure looks better today.  She is no longer having pain and Toprol seems to be doing the previous job.      Relevant Medications   atorvastatin (LIPITOR) 40 MG tablet   aspirin EC 81 MG tablet   metoprolol succinate (TOPROL-XL) 50 MG 24 hr tablet   Hyperlipidemia with target LDL  less than 70 (Chronic)    Her LDL was 67 recently on current dose of atorvastatin.  Based on proximal LAD lesion, I would continue atorvastatin at current dose.      Relevant Medications   atorvastatin (LIPITOR) 40 MG tablet   aspirin EC 81 MG tablet   metoprolol succinate (TOPROL-XL)  50 MG 24 hr tablet      COVID-19 Education: The signs and symptoms of COVID-19 were discussed with the patient and how to seek care for testing (follow up with PCP or arrange E-visit).   The importance of social distancing was discussed today.  I spent a total of 18 minutes with the patient and chart review in direct patient consultation.  Additional time spent with chart review (studies, outside notes, etc): 8 Total Time: 26 min  Current medicines are reviewed at length with the patient today.  (+/- concerns) n/a   Patient Instructions / Medication Changes & Studies & Tests Ordered   Patient Instructions  Medication Instructions:  NO CHANGES  *If you need a refill on your cardiac medications before your next appointment, please call your pharmacy*   Lab Work: NOT NEEDED   Testing/Procedures: NOT NEEDED   Follow-Up: At [facility], you and your health needs are our priority.  As part of our continuing mission to provide you with exceptional heart care, we have created designated Provider Care Teams.  These Care Teams include your primary Cardiologist (physician) and Advanced Practice Providers (APPs -  Physician Assistants and Nurse Practitioners) who all work together to provide you with the care you need, when you need it.    Your next appointment:   6 month(s)  The format for your next appointment:   In Person  Provider:   Glenetta Hew, MD       Studies Ordered:   No orders of the defined types were placed in this encounter.    Glenetta Hew, M.D., M.S. Interventional Cardiologist   Pager # [phone] Phone # [phone] [address] [place], [place] [address]   Thank you for choosing Heartcare at [facility]!!

## 2020-06-19 NOTE — Patient Instructions (Addendum)

## 2020-06-23 ENCOUNTER — Encounter: Payer: Self-pay | Admitting: Cardiology

## 2020-06-23 NOTE — Assessment & Plan Note (Signed)
Blood pressure looks better today.  She is no longer having pain and Toprol seems to be doing the previous job.

## 2020-06-23 NOTE — Assessment & Plan Note (Signed)
Her LDL was 67 recently on current dose of atorvastatin.  Based on proximal LAD lesion, I would continue atorvastatin at current dose.

## 2020-06-23 NOTE — Assessment & Plan Note (Signed)
Single vessel CAD of the LAD that was not visualized significant on cath.  However with significantly rapid SVT she was symptomatic.  This could have been possibly spasm related as well.  Plan: We will continue rate control with Toprol.  I did recommend that she continue take her statin. Plan she is on aspirin which makes sense, but okay to hold if she is to be taken NSAIDs.  Thankfully, she is not having any exertional chest tightness or dyspnea now that her knee has better.  Continue to monitor but would probably not consider intervention unless she would have significant anginal symptoms.

## 2020-06-23 NOTE — Assessment & Plan Note (Signed)
No breakthrough episodes.  We discussed vagal maneuvers. She is on Toprol and well-controlled.

## 2020-06-27 MED FILL — ALPRAZolam 0.5 MG TABS: 0.5 | 90 days supply | Qty: 270 | Fill #0

## 2020-06-27 MED FILL — ALENDRONATE NA 70 MG TAB: 70 | 28 days supply | Qty: 4 | Fill #3

## 2020-06-29 DIAGNOSIS — H524 Presbyopia: Secondary | ICD-10-CM | POA: Diagnosis not present

## 2020-06-29 DIAGNOSIS — Z961 Presence of intraocular lens: Secondary | ICD-10-CM | POA: Diagnosis not present

## 2020-07-11 ENCOUNTER — Other Ambulatory Visit (HOSPITAL_BASED_OUTPATIENT_CLINIC_OR_DEPARTMENT_OTHER): Payer: Self-pay | Admitting: Internal Medicine

## 2020-07-11 ENCOUNTER — Ambulatory Visit: Payer: Medicare Other | Attending: Internal Medicine

## 2020-07-11 DIAGNOSIS — Z23 Encounter for immunization: Secondary | ICD-10-CM

## 2020-07-19 MED FILL — PFIZER-BIONTECH COVID-19 VA: 30 | 1 days supply | Qty: 0 | Fill #0

## 2020-07-26 ENCOUNTER — Other Ambulatory Visit (HOSPITAL_COMMUNITY): Payer: Self-pay | Admitting: Family Medicine

## 2020-07-26 MED FILL — ALENDRONATE NA 70 MG TAB: 70 | 28 days supply | Qty: 4 | Fill #0

## 2020-07-28 ENCOUNTER — Ambulatory Visit: Payer: Medicare Other

## 2020-08-19 MED FILL — ALENDRONATE NA 70 MG TAB: 70 | 28 days supply | Qty: 4 | Fill #1

## 2020-08-19 MED FILL — buPROPion HCL ER (XL) 300 M: 300 | 90 days supply | Qty: 90 | Fill #3

## 2020-08-30 MED FILL — METOPROLOL SUCCINATE ER 50: 50 | 90 days supply | Qty: 90 | Fill #0

## 2020-09-05 DIAGNOSIS — Z96651 Presence of right artificial knee joint: Secondary | ICD-10-CM | POA: Diagnosis not present

## 2020-09-05 DIAGNOSIS — Z96652 Presence of left artificial knee joint: Secondary | ICD-10-CM | POA: Diagnosis not present

## 2020-09-08 DIAGNOSIS — Z8371 Family history of colonic polyps: Secondary | ICD-10-CM | POA: Diagnosis not present

## 2020-09-08 DIAGNOSIS — Z86010 Personal history of colonic polyps: Secondary | ICD-10-CM | POA: Diagnosis not present

## 2020-09-08 DIAGNOSIS — K5901 Slow transit constipation: Secondary | ICD-10-CM | POA: Diagnosis not present

## 2020-09-18 MED FILL — ALENDRONATE NA 70 MG TAB: 70 | 28 days supply | Qty: 4 | Fill #2

## 2020-09-18 MED FILL — ATORVASTATIN 40 MG TABLET: 40 | 90 days supply | Qty: 90 | Fill #1

## 2020-09-18 MED FILL — NITROFURANTOIN MONO-MCR 100: 100 | 15 days supply | Qty: 30 | Fill #3

## 2020-09-18 MED FILL — ALPRAZolam 0.5 MG TABS: 0.5 | 90 days supply | Qty: 270 | Fill #1

## 2020-10-09 ENCOUNTER — Telehealth: Payer: Self-pay | Admitting: Cardiology

## 2020-10-09 NOTE — Telephone Encounter (Signed)
Would you like blood work before appointment in March?  Thanks!

## 2020-10-09 NOTE — Telephone Encounter (Signed)
[name] is calling wanting to know if Dr. Ellyn Hack is wanting her to have blood work prior to her 6 month follow up that is scheduled. Please advise.

## 2020-10-12 NOTE — Telephone Encounter (Signed)
Informed patient does not need labs for March  2022 Appointment. Lipids in July 2021 were excellent.   Primary may do labs at your next wellness check next month and these labs will suffice other wise labs are not needed.

## 2020-10-12 NOTE — Telephone Encounter (Signed)
Patient verbalized understanding  

## 2020-10-20 DIAGNOSIS — E7800 Pure hypercholesterolemia, unspecified: Secondary | ICD-10-CM | POA: Diagnosis not present

## 2020-10-20 DIAGNOSIS — M810 Age-related osteoporosis without current pathological fracture: Secondary | ICD-10-CM | POA: Diagnosis not present

## 2020-10-20 DIAGNOSIS — I10 Essential (primary) hypertension: Secondary | ICD-10-CM | POA: Diagnosis not present

## 2020-10-20 DIAGNOSIS — Z0000 Encounter for general adult medical examination without abnormal findings: Secondary | ICD-10-CM | POA: Diagnosis not present

## 2020-10-30 DIAGNOSIS — Z01812 Encounter for preprocedural laboratory examination: Secondary | ICD-10-CM | POA: Diagnosis not present

## 2020-11-03 DIAGNOSIS — U071 COVID-19: Secondary | ICD-10-CM | POA: Diagnosis not present

## 2020-11-28 MED FILL — METOPROLOL SUCCINATE ER 50: 50 | 90 days supply | Qty: 90 | Fill #1

## 2020-12-19 DIAGNOSIS — Z7689 Persons encountering health services in other specified circumstances: Secondary | ICD-10-CM | POA: Diagnosis not present

## 2020-12-19 DIAGNOSIS — Z1231 Encounter for screening mammogram for malignant neoplasm of breast: Secondary | ICD-10-CM | POA: Diagnosis not present

## 2020-12-20 ENCOUNTER — Ambulatory Visit: Payer: Medicare Other | Admitting: Cardiology

## 2020-12-20 ENCOUNTER — Other Ambulatory Visit: Payer: Self-pay

## 2020-12-20 ENCOUNTER — Encounter: Payer: Self-pay | Admitting: Cardiology

## 2020-12-20 VITALS — BP 112/84 | HR 57 | Ht 66.0 in | Wt 205.2 lb

## 2020-12-20 DIAGNOSIS — I10 Essential (primary) hypertension: Secondary | ICD-10-CM

## 2020-12-20 DIAGNOSIS — E785 Hyperlipidemia, unspecified: Secondary | ICD-10-CM

## 2020-12-20 DIAGNOSIS — Z01810 Encounter for preprocedural cardiovascular examination: Secondary | ICD-10-CM

## 2020-12-20 DIAGNOSIS — I2510 Atherosclerotic heart disease of native coronary artery without angina pectoris: Secondary | ICD-10-CM | POA: Diagnosis not present

## 2020-12-20 DIAGNOSIS — I471 Supraventricular tachycardia: Secondary | ICD-10-CM

## 2020-12-20 MED ORDER — NITROGLYCERIN 0.4 MG SL SUBL: 0.4000 mg | SUBLINGUAL_TABLET | SUBLINGUAL | 3 refills | Status: DC | PRN

## 2020-12-20 NOTE — Patient Instructions (Signed)
Medication Instructions:  No Changes In Medications at this time.  *If you need a refill on your cardiac medications before your next appointment, please call your pharmacy*  Follow-Up: At [facility], you and your health needs are our priority.  As part of our continuing mission to provide you with exceptional heart care, we have created designated Provider Care Teams.  These Care Teams include your primary Cardiologist (physician) and Advanced Practice Providers (APPs -  Physician Assistants and Nurse Practitioners) who all work together to provide you with the care you need, when you need it.  Your next appointment:   6 month(s)  The format for your next appointment:   In Person  Provider:   You will see one of the following Advanced Practice Providers on your designated Care Team:    Rosaria Ferries, PA-C  Jory Sims, DNP, ANP  Then, Glenetta Hew, MD will plan to see you again in 12 month(s).  Other Instructions OKAY TO HAVE COLONOSCOPY- DOES NOT NEED ANY ADDITIONAL CARDIAC TESTING AND OKAY TO HOLD ASPIRIN IF NEEDED> PER DR. HARDING

## 2020-12-20 NOTE — Progress Notes (Signed)
Primary Care Provider: Maurice Small, MD Cardiologist: Glenetta Hew, MD Electrophysiologist: None  Clinic Note: Chief Complaint  Patient presents with  . Follow-up    6 months.  Needs cardiology evaluation prior to colonoscopy.  . Coronary Artery Disease    Moderate LAD disease, nonobstructive, no angina symptoms.  . Obesity    Significant weight loss with diet and increase exercise.    ===================================  ASSESSMENT/PLAN   Problem List Items Addressed This Visit    Preop cardiovascular exam    She indicated that her gastroenterologist (Dr. Watt Climes) needed clearance for colonoscopy.  Low risk procedure.  Patient is asymptomatic with stable nonobstructive CAD.  She does have history of SVT, would not hold beta-blocker prior to procedure. Would likely break with adenosine if it does occur.  This should not preclude her from having her procedure done.  She does not have diabetes, no angina, no heart failure, no history of stroke.  Would be a low risk patient for low risk procedure.      Coronary artery disease, 60% LAD prior to D1 - Primary (Chronic)    Single-vessel disease in the LAD.  Nonobstructive on cath films.  She had positive troponins and chest discomfort in the setting of SVT, but has been very active with exercise with no issues.  Since she is routinely exercising without any active symptoms, and no feel that any further evaluation required unless she would have symptoms.  Plan: Continue aspirin and statin along with beta-blocker. For peace of mind, she would like to refill her nitroglycerin.  She has not had to use it since I met her.      Relevant Medications   nitroGLYCERIN (NITROSTAT) 0.4 MG SL tablet   Other Relevant Orders   EKG 12-Lead (Completed)   SVT (supraventricular tachycardia) (HCC) (Chronic)    She is only had that one episode back in 2020, none since then.  Is now on stable dose of Toprol.  We did talk about adequate  hydration and she is very good with keeping her bottle of ice cold water.  We talked about vagal maneuvers.      Relevant Medications   nitroGLYCERIN (NITROSTAT) 0.4 MG SL tablet   Other Relevant Orders   EKG 12-Lead (Completed)   Essential hypertension (Chronic)    Outstanding blood pressure today.  Weight loss has definitely helped.  She is on stable dose of beta-blocker which is as much for SVT is anything else.  Not on ACE inhibitor or ARB which would be next choice if necessary.      Relevant Medications   nitroGLYCERIN (NITROSTAT) 0.4 MG SL tablet   Other Relevant Orders   EKG 12-Lead (Completed)   Hyperlipidemia with target LDL less than 70 (Chronic)    LDL still in the target range of less than 70 currently 68.  Pretty much stable.  Continue current dose of atorvastatin.      Relevant Medications   nitroGLYCERIN (NITROSTAT) 0.4 MG SL tablet   Other Relevant Orders   EKG 12-Lead (Completed)      ===================================  HPI:    Cynthia [name] is a 70 y.o. female with a PMH notable for symptomatic PSVT, and moderate LAD disease below who presents today for 77-monthfollow-up.   August 24-25, 2020: [facility] with chest pain and palpitations  => SVT (185 bpm), given 6 mg IV adenosine restoring NSR with transient Inferior ST elevations=> Code STEMI called. => Cath showed 60 to 65% proximal LAD  disease but otherwise no occlusive disease.  Started on Toprol as well as aspirin and statin.  Cynthia [name] was last seen on 06/19/2020-doing pretty well.  Gradually recovering from knee surgery and was trying to lose weight.  Was getting over the loss of her sister.  Try to do more walking as needed was started doing better.  Doing therapy at [facility].  Mostly forgetting her Covid booster in October.  No recurrent episodes of tachycardia with rest or exertion.  Able to get her heart rate up to 110 beats a minute range without any chest discomfort or dyspnea.  She is  noted a little deconditioning.  We spent some time talking about her LAD lesion.  No changes made.  Recent Hospitalizations: None  Reviewed  CV studies:    The following studies were reviewed today: (if available, images/films reviewed: From Epic Chart or Care Everywhere) . None:   Interval History:   Cynthia [name] returns today for routine follow-up stating that she has been doing major effort to try to lose weight.  She has made dramatic changes to her diet, and slowly try to increase her level of exercise.  She is just limited by her knees.  The knees are definitely better since there knee replacement surgeries, but they are just more stiff than sore or painful.  She just has a hard time doing some of the exercise that she would like to do.  She really has not had any symptoms to speak of any chest tightness or pressure with rest or exertion.  No heart failure symptoms or arrhythmia symptoms.  She is very happy with her lab results, seems to be tolerating atorvastatin relatively well.  CV Review of Symptoms (Summary): no chest pain or dyspnea on exertion positive for - Intended weight loss, exercise intolerance more related to arthritis pains. negative for - edema, irregular heartbeat, orthopnea, palpitations, paroxysmal nocturnal dyspnea, rapid heart rate, shortness of breath or Syncope/near syncope or TIA/amaurosis fugax, claudication  The patient does not have symptoms concerning for COVID-19 infection (fever, chills, cough, or new shortness of breath).   REVIEWED OF SYSTEMS   Review of Systems  Constitutional: Positive for weight loss (Intentional). Negative for malaise/fatigue.  HENT: Negative for congestion and nosebleeds.   Respiratory: Negative for cough and shortness of breath.   Gastrointestinal: Negative for blood in stool and melena.  Genitourinary: Negative for hematuria.  Musculoskeletal: Positive for joint pain (More stiffness than pain.  Range of motion is not as  good, but the pain is gone.  (Both knees)).  Neurological: Negative for dizziness, focal weakness and weakness.  Psychiatric/Behavioral: Negative for memory loss. The patient is not nervous/anxious and does not have insomnia.    Left knee-residual postop swelling.  Feeling better.  Less stiff.  Increased range of motion.  I have reviewed and (if needed) personally updated the patient's problem list, medications, allergies, past medical and surgical history, social and family history.   PAST MEDICAL HISTORY   Past Medical History:  Diagnosis Date  . Anemia   . Anxiety disorder   . Aortic valve sclerosis 05/24/2019   Moderate noted on ECHO  . Arthritis    right knee  . Cancer of the skin, basal cell   . Coronary artery disease    Moderate to severe, non-critical single-vessel coronary artery disease with 60-70% proximal LAD stenosis at origin of large diagonal branch ([place] 1,0,0).  . Demand ischemia ([place]) 05/24/2019  . Depression   . GERD (  gastroesophageal reflux disease)    TUMS  . Hypertension   . Lateral meniscus derangement, right   . Raynaud's phenomenon    questionable because hands get white when cold  . SVT (supraventricular tachycardia) ([place])     PAST SURGICAL HISTORY   Past Surgical History:  Procedure Laterality Date  . CARDIOVERSION  05/24/2019   Adenosine  . CATARACT EXTRACTION, BILATERAL    . CESAREAN SECTION     x3  . CHONDROPLASTY Right 02/21/2017   Procedure: CHONDROPLASTY;  Surgeon: Melrose Nakayama, MD;  Location: [facility];  Service: Orthopedics;  Laterality: Right;  . COLONOSCOPY    . KNEE ARTHROSCOPY WITH LATERAL MENISECTOMY Right 02/21/2017   Procedure: KNEE ARTHROSCOPY WITH LATERAL MENISECTOMY;  Surgeon: Melrose Nakayama, MD;  Location: [facility];  Service: Orthopedics;  Laterality: Right;  . KNEE ARTHROSCOPY WITH MEDIAL MENISECTOMY Right 02/21/2017   Procedure: KNEE ARTHROSCOPY WITH MEDIAL MENISECTOMY;  Surgeon:  Melrose Nakayama, MD;  Location: [facility];  Service: Orthopedics;  Laterality: Right;  . LEFT HEART CATH AND CORONARY ANGIOGRAPHY N/A 05/24/2019   Procedure: LEFT HEART CATH AND CORONARY ANGIOGRAPHY;  Surgeon: Nelva Bush, MD;  Location: [facility];  Service: Cardiovascular;  Laterality: N/A;  . MOUTH SURGERY    . TOTAL KNEE ARTHROPLASTY Right 07/05/2019   Procedure: RIGHT TOTAL KNEE ARTHROPLASTY;  Surgeon: Frederik Pear, MD;  Location: [facility];  Service: Orthopedics;  Laterality: Right;  . TOTAL KNEE ARTHROPLASTY Left 10/20/2019   Procedure: LEFT TOTAL KNEE ARTHROPLASTY;  Surgeon: Frederik Pear, MD;  Location: [facility];  Service: Orthopedics;  Laterality: Left;  . Yag Capsulotomy Left 06/30/2019    Echocardiogram 05/24/2019: Normal LV size and function.  EF 55 to 60%.  No RWM A.  Moderate aortic sclerosis with no stenosis.  LEFT HEART CATH-CORONARY ANGIOGRAPHY 05/24/2019: Moderate-severe noncritical single-vessel CAD-60-70% proximal LAD at origin of large D1 ([place] 1,0, 0)-recommend medical management. t    Immunization History  Administered Date(s) Administered  . PFIZER(Purple Top)SARS-COV-2 Vaccination 11/06/2019, 12/01/2019, 07/11/2020    MEDICATIONS/ALLERGIES   Current Meds  Medication Sig  . ALPRAZolam (XANAX) 0.5 MG tablet Take 0.25 mg by mouth at bedtime as needed for anxiety.   Marland Kitchen aspirin EC 81 MG tablet Take 81 mg by mouth daily.  Marland Kitchen atorvastatin (LIPITOR) 40 MG tablet TAKE 1 TABLET BY MOUTH DAILY AT 6 PM.  . buPROPion (WELLBUTRIN XL) 300 MG 24 hr tablet Take 300 mg by mouth daily.  . Calcium Carbonate-Vitamin D (CALTRATE 600+D PO) Take 1 tablet by mouth daily.  . ergocalciferol (VITAMIN D2) 1.25 MG (50000 UT) capsule Vitamin D2 1,250 mcg (50,000 unit) capsule  . ferrous sulfate 325 (65 FE) MG tablet Take 325 mg by mouth daily with breakfast.  . metoprolol succinate (TOPROL-XL) 50 MG 24 hr tablet Take 1 tablet (50 mg total) by mouth daily. Take with or  immediately following a meal.  . Multiple Vitamins-Minerals (MULTIVITAMIN ADULT PO) Take 1 tablet by mouth daily.  . nitrofurantoin, macrocrystal-monohydrate, (MACROBID) 100 MG capsule Take 100 mg by mouth 2 (two) times daily.  Marland Kitchen nystatin-triamcinolone (MYCOLOG II) cream Apply 1 application topically daily as needed for rash.  . [DISCONTINUED] nitroGLYCERIN (NITROSTAT) 0.4 MG SL tablet Place 1 tablet (0.4 mg total) under the tongue every 5 (five) minutes x 3 doses as needed for chest pain.    Allergies  Allergen Reactions  . Penicillins Rash    Pt does not remember, states she was real young  SOCIAL HISTORY/FAMILY HISTORY   Reviewed in Epic:  Pertinent findings:  Social History   Tobacco Use  . Smoking status: Never Smoker  . Smokeless tobacco: Never Used  Vaping Use  . Vaping Use: Never used  Substance Use Topics  . Alcohol use: Not Currently    Comment: social  . Drug use: Never   Social History   Social History Narrative   Completed COVID-19 vaccine in February 2021    OBJCTIVE -PE, EKG, labs   Wt Readings from Last 3 Encounters:  12/20/20 205 lb 3.2 oz (93.1 kg)  06/19/20 224 lb (101.6 kg)  12/20/19 236 lb (107 kg)    Physical Exam: BP 112/84 (BP Location: Right Arm, Patient Position: Sitting)   Pulse (!) 57   Ht _0  (1.676 m)   Wt 205 lb 3.2 oz (93.1 kg)   BMI 33.12 kg/m  Physical Exam Vitals reviewed.  Constitutional:      General: She is not in acute distress.    Appearance: Normal appearance. She is obese. She is not ill-appearing or toxic-appearing.  HENT:     Head: Normocephalic and atraumatic.  Neck:     Vascular: No carotid bruit, hepatojugular reflux or JVD.  Cardiovascular:     Rate and Rhythm: Normal rate and regular rhythm.  No extrasystoles are present.    Chest Wall: PMI is not displaced.     Pulses: Intact distal pulses. Decreased pulses (Due to body habitus).     Heart sounds: S1 normal and S2 normal. Heart sounds are distant.  Murmur (1/6C-D SEM at RUSB-neck) heard.  No friction rub. No gallop.   Pulmonary:     Effort: Pulmonary effort is normal. No respiratory distress.     Breath sounds: Normal breath sounds.  Chest:     Chest wall: No tenderness.  Musculoskeletal:        General: No swelling. Normal range of motion.     Cervical back: Normal range of motion and neck supple.  Skin:    General: Skin is warm and dry.  Neurological:     General: No focal deficit present.     Mental Status: She is alert and oriented to person, place, and time.  Psychiatric:        Mood and Affect: Mood normal.        Behavior: Behavior normal.        Thought Content: Thought content normal.        Judgment: Judgment normal.     Adult ECG Report  Rate: 57 ;  Rhythm: sinus bradycardia and Normal axis, intervals and durations.;   Narrative Interpretation: Stable  Recent Labs:   10/20/2020:   TC 136, TG 77, HDL 50, LDL 68;   Na+ 139, K+ 4.9, Cl- 104, HCO3-29, BUN 21, Cr 0.79, Glu 106, Ca2+ 10.1; AST 21, ALT 29, AlkP 52 Previous labs reviewed Lab Results  Component Value Date   CHOL 145 04/10/2020   HDL 62 04/10/2020   LDLCALC 67 04/10/2020   TRIG 86 04/10/2020   CHOLHDL 2.3 04/10/2020   Lab Results  Component Value Date   CREATININE 0.82 04/10/2020   BUN 22 04/10/2020   NA 141 04/10/2020   K 5.0 04/10/2020   CL 103 04/10/2020   CO2 25 04/10/2020   CBC Latest Ref Rng & Units 10/21/2019 10/18/2019 07/06/2019  WBC 4.0 - 10.5 K/uL 11.1(H) 7.6 13.0(H)  Hemoglobin 12.0 - 15.0 g/dL 10.6(L) 13.2 10.1(L)  Hematocrit 36.0 - 46.0 % 33.6(L) 41.9  32.1(L)  Platelets 150 - 400 K/uL 241 291 239    No results found for: TSH  ==================================================  COVID-19 Education: The signs and symptoms of COVID-19 were discussed with the patient and how to seek care for testing (follow up with PCP or arrange E-visit).   The importance of social distancing and COVID-19 vaccination was discussed  today. The patient is practicing social distancing & Masking.   I spent a total of 28 minutes with the patient spent in direct patient consultation.  Additional time spent with chart review  / charting (studies, outside notes, etc): 14 min Total Time: 42 min  Current medicines are reviewed at length with the patient today.  (+/- concerns) n/a  This visit occurred during the SARS-CoV-2 public health emergency.  Safety protocols were in place, including screening questions prior to the visit, additional usage of staff PPE, and extensive cleaning of exam room while observing appropriate contact time as indicated for disinfecting solutions.  Notice: This dictation was prepared with Dragon dictation along with smaller phrase technology. Any transcriptional errors that result from this process are unintentional and may not be corrected upon review.  Patient Instructions / Medication Changes & Studies & Tests Ordered   Patient Instructions  Medication Instructions:  No Changes In Medications at this time.  *If you need a refill on your cardiac medications before your next appointment, please call your pharmacy*  Follow-Up: At [facility], you and your health needs are our priority.  As part of our continuing mission to provide you with exceptional heart care, we have created designated Provider Care Teams.  These Care Teams include your primary Cardiologist (physician) and Advanced Practice Providers (APPs -  Physician Assistants and Nurse Practitioners) who all work together to provide you with the care you need, when you need it.  Your next appointment:   6 month(s)  The format for your next appointment:   In Person  Provider:   You will see one of the following Advanced Practice Providers on your designated Care Team:    Rosaria Ferries, PA-C  Jory Sims, DNP, ANP  Then, Glenetta Hew, MD will plan to see you again in 12 month(s).  Other Instructions OKAY TO HAVE  COLONOSCOPY- DOES NOT NEED ANY ADDITIONAL CARDIAC TESTING AND OKAY TO HOLD ASPIRIN IF NEEDED> PER DR. HARDING      Studies Ordered:   Orders Placed This Encounter  Procedures  . EKG 12-Lead     Glenetta Hew, M.D., M.S. Interventional Cardiologist   Pager # [phone] Phone # [phone] [address] [place], [place] [address]   Thank you for choosing [facility] at [facility]!!

## 2020-12-23 ENCOUNTER — Encounter: Payer: Self-pay | Admitting: Cardiology

## 2020-12-23 DIAGNOSIS — Z01810 Encounter for preprocedural cardiovascular examination: Secondary | ICD-10-CM | POA: Insufficient documentation

## 2020-12-23 NOTE — Assessment & Plan Note (Signed)
Single-vessel disease in the LAD.  Nonobstructive on cath films.  She had positive troponins and chest discomfort in the setting of SVT, but has been very active with exercise with no issues.  Since she is routinely exercising without any active symptoms, and no feel that any further evaluation required unless she would have symptoms.  Plan: Continue aspirin and statin along with beta-blocker. For peace of mind, she would like to refill her nitroglycerin.  She has not had to use it since I met her.

## 2020-12-23 NOTE — Assessment & Plan Note (Signed)
She indicated that her gastroenterologist (Dr. Watt Climes) needed clearance for colonoscopy.  Low risk procedure.  Patient is asymptomatic with stable nonobstructive CAD.  She does have history of SVT, would not hold beta-blocker prior to procedure. Would likely break with adenosine if it does occur.  This should not preclude her from having her procedure done.  She does not have diabetes, no angina, no heart failure, no history of stroke.  Would be a low risk patient for low risk procedure.

## 2020-12-23 NOTE — Assessment & Plan Note (Signed)
Outstanding blood pressure today.  Weight loss has definitely helped.  She is on stable dose of beta-blocker which is as much for SVT is anything else.  Not on ACE inhibitor or ARB which would be next choice if necessary.

## 2020-12-23 NOTE — Assessment & Plan Note (Signed)
LDL still in the target range of less than 70 currently 68.  Pretty much stable.  Continue current dose of atorvastatin.

## 2020-12-23 NOTE — Assessment & Plan Note (Signed)
She is only had that one episode back in 2020, none since then.  Is now on stable dose of Toprol.  We did talk about adequate hydration and she is very good with keeping her bottle of ice cold water.  We talked about vagal maneuvers.

## 2020-12-25 DIAGNOSIS — Z01812 Encounter for preprocedural laboratory examination: Secondary | ICD-10-CM | POA: Diagnosis not present

## 2020-12-28 DIAGNOSIS — Z86010 Personal history of colonic polyps: Secondary | ICD-10-CM | POA: Diagnosis not present

## 2020-12-28 DIAGNOSIS — K5730 Diverticulosis of large intestine without perforation or abscess without bleeding: Secondary | ICD-10-CM | POA: Diagnosis not present

## 2021-01-22 DIAGNOSIS — L814 Other melanin hyperpigmentation: Secondary | ICD-10-CM | POA: Diagnosis not present

## 2021-01-22 DIAGNOSIS — D225 Melanocytic nevi of trunk: Secondary | ICD-10-CM | POA: Diagnosis not present

## 2021-01-22 DIAGNOSIS — Z85828 Personal history of other malignant neoplasm of skin: Secondary | ICD-10-CM | POA: Diagnosis not present

## 2021-01-22 DIAGNOSIS — D1801 Hemangioma of skin and subcutaneous tissue: Secondary | ICD-10-CM | POA: Diagnosis not present

## 2021-02-12 ENCOUNTER — Other Ambulatory Visit (HOSPITAL_COMMUNITY): Payer: Self-pay

## 2021-02-23 ENCOUNTER — Other Ambulatory Visit: Payer: Self-pay

## 2021-02-23 MED ORDER — METOPROLOL SUCCINATE ER 50 MG PO TB24: ORAL_TABLET | ORAL | 3 refills | Status: DC

## 2021-03-14 MED ORDER — ATORVASTATIN CALCIUM 40 MG PO TABS: ORAL_TABLET | ORAL | 3 refills | Status: DC

## 2021-03-14 NOTE — Telephone Encounter (Signed)
Prescription e-sent to pharmacy [id number] refill x 3  - Atorvastatin 40 mg

## 2021-03-28 ENCOUNTER — Encounter: Payer: Self-pay | Admitting: Cardiology

## 2021-06-22 ENCOUNTER — Other Ambulatory Visit: Payer: Self-pay

## 2021-06-22 ENCOUNTER — Encounter: Payer: Self-pay | Admitting: Cardiology

## 2021-06-22 ENCOUNTER — Ambulatory Visit: Payer: Medicare Other | Admitting: Cardiology

## 2021-06-22 VITALS — BP 110/60 | HR 59 | Ht 65.0 in | Wt 194.2 lb

## 2021-06-22 DIAGNOSIS — I10 Essential (primary) hypertension: Secondary | ICD-10-CM

## 2021-06-22 DIAGNOSIS — I2510 Atherosclerotic heart disease of native coronary artery without angina pectoris: Secondary | ICD-10-CM | POA: Diagnosis not present

## 2021-06-22 DIAGNOSIS — E785 Hyperlipidemia, unspecified: Secondary | ICD-10-CM | POA: Diagnosis not present

## 2021-06-22 DIAGNOSIS — I471 Supraventricular tachycardia: Secondary | ICD-10-CM | POA: Diagnosis not present

## 2021-06-22 NOTE — Progress Notes (Signed)
Primary Care Provider: Maurice Small, MD (Inactive) Cardiologist: Glenetta Hew, MD Electrophysiologist: None  Clinic Note: Chief Complaint  Patient presents with   Follow-up    Doing well.  No major issues.   Coronary Artery Disease    No angina   Tachycardia    Since last visit--1 episode of SVT, both vagal numbers.   ===================================  ASSESSMENT/PLAN   Problem List Items Addressed This Visit       Cardiology Problems   Coronary artery disease, 60% LAD prior to D1 (Chronic)    Moderate single-vessel CAD in the LAD.  Nonobstructive disease.  Plan: Continue aggressive risk factor modification Aspirin, statin and beta-blocker.   Continue to encourage exercise and dietary control -> congratulated her on her weight loss.      Relevant Orders   Lipid panel   Comprehensive metabolic panel   SVT (supraventricular tachycardia) (HCC) - Primary (Chronic)    She had 1 spell since her last visit that was broken with vagal maneuvers.  She is very happy to see if that worked.  Otherwise has been very stable on current dose of Toprol.  No changes.  We refreshed her education on vagal maneuvers and also avoiding triggers and staying out of hydrated.      Essential hypertension (Chronic)    Well-controlled blood pressure on current dose of Toprol.  On beta-blocker because of SVT.      Hyperlipidemia with target LDL less than 70 (Chronic)    With moderate LAD disease, plan is aggressive medical management including aggressive lipid control.  Last LDL was 68 as of January of this year.  Due for labs recheck in January. Continue current dose of atorvastatin.  Tolerating well with no myalgias or memory issues.      Relevant Orders   Lipid panel   Comprehensive metabolic panel    ===================================  HPI:    Cynthia [name] is a 70 y.o. female with a PMH notable for symptomatic PSVT and moderate LAD disease who presents today for 3-month  follow-up.  August 2020: Presented to [facility] ED with an episode of SVT broken with IV adenosine.  Had transient inferior ST elevations, referred for cardiac catheter showed moderate 60% proximal LAD.  Plan medical management.  Cynthia [name] was last seen on 12/20/2020: Was trying hard to try to lose weight.  Has made changes in her diet increase exercise.  She is limited by knee pain, which significantly improved after the surgery.  More stiffness and pain.  Not able to exercise as hard as she had been.  No heart failure symptoms or limiting symptoms.  Tolerating atorvastatin. No medical changes.  Cleared for colonoscopy.   CV Review of Symptoms (Summary): no chest pain or dyspnea on exertion positive for - Intended weight loss, exercise intolerance more related to arthritis pains. negative for - edema, irregular heartbeat, orthopnea, palpitations, paroxysmal nocturnal dyspnea, rapid heart rate, shortness of breath or Syncope/near syncope or TIA/amaurosis fugax, claudicatio   Recent Hospitalizations: None  Reviewed  CV studies:    The following studies were reviewed today: (if available, images/films reviewed: From Epic Chart or Care Everywhere) None:   Interval History:   Cynthia [name] presents for 68-month follow-up overall doing pretty well.  She says that she is "doing okay ".  Every once in a while she just feels more tired than usual, but has not necessarily had any sensation of chest tightness or pressure.  Off and on has symptoms of indigestion  or GERD but nothing overly worrisome.  She thinks that she has maybe had 1 episode of fast heart rate a few months ago that she was able to break with a vagal maneuver.  Otherwise has not any prolonged arrhythmia symptoms.  Still working hard to try to lose weight.  Has lost almost 10 pounds since last visit.  CV Review of Symptoms (Summary) Cardiovascular ROS: positive for - 1 episode of tachycardia that cleared with vagal.  Occasional  indigestion symptoms, but not really chest pain.  Off and on feeling more tired than usual negative for - chest pain, dyspnea on exertion, edema, irregular heartbeat, orthopnea, palpitations, paroxysmal nocturnal dyspnea, rapid heart rate, shortness of breath, or lightheadedness, dizziness or wooziness.  Syncope/near syncope or TIA/amaurosis fugax, claudication  REVIEWED OF SYSTEMS   Review of Systems  Constitutional:  Positive for malaise/fatigue (Off-and-on occasional episodes she has some exercise fatigue, but not routinely.) and weight loss (Significant weight loss).  HENT:  Negative for congestion and nosebleeds.   Respiratory:  Positive for shortness of breath (With more intense exercise.). Negative for cough and wheezing.   Cardiovascular:  Positive for leg swelling (Maybe mild end of day edema.).  Gastrointestinal:  Negative for blood in stool and melena.  Genitourinary:  Negative for hematuria.  Musculoskeletal:  Positive for joint pain (Mild joint stiffness.). Negative for myalgias.  Neurological:  Negative for dizziness and focal weakness.  Psychiatric/Behavioral:  Negative for depression and memory loss. The patient is not nervous/anxious and does not have insomnia.    I have reviewed and (if needed) personally updated the patient's problem list, medications, allergies, past medical and surgical history, social and family history.   PAST MEDICAL HISTORY   Past Medical History:  Diagnosis Date   Anemia    Anxiety disorder    Aortic valve sclerosis 05/24/2019   Moderate noted on ECHO   Arthritis    right knee   Cancer of the skin, basal cell    Coronary artery disease    Moderate to severe, non-critical single-vessel coronary artery disease with 60-70% proximal LAD stenosis at origin of large diagonal branch ([place] 1,0,0).   Demand ischemia ([place]) 05/24/2019   Depression    GERD (gastroesophageal reflux disease)    TUMS   Hypertension    Lateral meniscus derangement,  right    Raynaud's phenomenon    questionable because hands get white when cold   SVT (supraventricular tachycardia) ([place])     PAST SURGICAL HISTORY   Past Surgical History:  Procedure Laterality Date   CARDIOVERSION  05/24/2019   Adenosine   CATARACT EXTRACTION, BILATERAL     CESAREAN SECTION     x3   CHONDROPLASTY Right 02/21/2017   Procedure: CHONDROPLASTY;  Surgeon: Melrose Nakayama, MD;  Location: [place];  Service: Orthopedics;  Laterality: Right;   COLONOSCOPY     KNEE ARTHROSCOPY WITH LATERAL MENISECTOMY Right 02/21/2017   Procedure: KNEE ARTHROSCOPY WITH LATERAL MENISECTOMY;  Surgeon: Melrose Nakayama, MD;  Location: [place];  Service: Orthopedics;  Laterality: Right;   KNEE ARTHROSCOPY WITH MEDIAL MENISECTOMY Right 02/21/2017   Procedure: KNEE ARTHROSCOPY WITH MEDIAL MENISECTOMY;  Surgeon: Melrose Nakayama, MD;  Location: [place];  Service: Orthopedics;  Laterality: Right;   LEFT HEART CATH AND CORONARY ANGIOGRAPHY N/A 05/24/2019   Procedure: LEFT HEART CATH AND CORONARY ANGIOGRAPHY;  Surgeon: Nelva Bush, MD;  Location: [place] [facility];  Service: Cardiovascular;  Laterality: N/A;   MOUTH SURGERY  TOTAL KNEE ARTHROPLASTY Right 07/05/2019   Procedure: RIGHT TOTAL KNEE ARTHROPLASTY;  Surgeon: Frederik Pear, MD;  Location: [facility];  Service: Orthopedics;  Laterality: Right;   TOTAL KNEE ARTHROPLASTY Left 10/20/2019   Procedure: LEFT TOTAL KNEE ARTHROPLASTY;  Surgeon: Frederik Pear, MD;  Location: [facility];  Service: Orthopedics;  Laterality: Left;   Yag Capsulotomy Left 06/30/2019   LEFT HEART CATH-CORONARY ANGIOGRAPHY 05/24/2019: Moderate-severe noncritical single-vessel CAD-60-70% proximal LAD at origin of large D1 ([place] 1,0, 0)-recommend medical management. t    Immunization History  Administered Date(s) Administered   PFIZER(Purple Top)SARS-COV-2 Vaccination 11/06/2019, 12/01/2019, 07/11/2020     MEDICATIONS/ALLERGIES   Current Meds  Medication Sig   alendronate (FOSAMAX) 70 MG tablet TAKE 1 TABLET BY MOUTH ON AN EMPTY STOMACH ONCE A WEEK **DO NOT LIE DOWN FOR AT LEAST 30 MINUTES AFTER TAKING TABLET**   ALPRAZolam (XANAX) 0.5 MG tablet Take 0.25 mg by mouth at bedtime as needed for anxiety.    aspirin EC 81 MG tablet Take 81 mg by mouth daily.   atorvastatin (LIPITOR) 40 MG tablet TAKE 1 TABLET BY MOUTH ONCE DAILY AT 6 PM   buPROPion (WELLBUTRIN XL) 300 MG 24 hr tablet Take 300 mg by mouth daily.   Calcium Carbonate-Vitamin D (CALTRATE 600+D PO) Take 1 tablet by mouth daily.   [EXPIRED] COVID-19 mRNA vaccine, Pfizer, 30 MCG/0.3ML injection INJECT AS DIRECTED   ergocalciferol (VITAMIN D2) 1.25 MG (50000 UT) capsule Vitamin D2 1,250 mcg (50,000 unit) capsule   ferrous sulfate 325 (65 FE) MG tablet Take 325 mg by mouth daily with breakfast.   metoprolol succinate (TOPROL-XL) 50 MG 24 hr tablet TAKE 1 TABLET BY MOUTH ONCE DAILY (TAKE WITH OR IMMEDIATELY FOLLOWING A MEAL)   Multiple Vitamins-Minerals (MULTIVITAMIN ADULT PO) Take 1 tablet by mouth daily.   nitrofurantoin, macrocrystal-monohydrate, (MACROBID) 100 MG capsule Take 100 mg by mouth 2 (two) times daily.   nitroGLYCERIN (NITROSTAT) 0.4 MG SL tablet Place 1 tablet (0.4 mg total) under the tongue every 5 (five) minutes x 3 doses as needed for chest pain.   nystatin-triamcinolone (MYCOLOG II) cream Apply 1 application topically daily as needed for rash.    Allergies  Allergen Reactions   Penicillins Rash    Pt does not remember, states she was real young    SOCIAL HISTORY/FAMILY HISTORY   Reviewed in Epic:  Pertinent findings:  Social History   Tobacco Use   Smoking status: Never   Smokeless tobacco: Never  Vaping Use   Vaping Use: Never used  Substance Use Topics   Alcohol use: Not Currently    Comment: social   Drug use: Never   Social History   Social History Narrative   ** Merged History Encounter **        Completed COVID-19 vaccine in February 2021    OBJCTIVE -PE, EKG, labs   Wt Readings from Last 3 Encounters:  06/22/21 194 lb 3.2 oz (88.1 kg)  12/20/20 205 lb 3.2 oz (93.1 kg)  06/19/20 224 lb (101.6 kg)    Physical Exam: BP 110/60 (BP Location: Right Arm)   Pulse (!) 59   Ht 5\' 5"  (1.651 m)   Wt 194 lb 3.2 oz (88.1 kg)   SpO2 99%   BMI 32.32 kg/m  Physical Exam Constitutional:      General: She is not in acute distress.    Appearance: Normal appearance. She is obese. She is not ill-appearing or toxic-appearing.     Comments: Remains obese, but notably  lighter than last visit.  HENT:     Head: Normocephalic and atraumatic.  Neck:     Vascular: No carotid bruit.  Cardiovascular:     Rate and Rhythm: Normal rate and regular rhythm. No extrasystoles are present.    Chest Wall: PMI is not displaced.     Pulses: Normal pulses.     Heart sounds: S1 normal and S2 normal. Heart sounds are distant. Murmur (1/6 C-DES C-D-neck.) heard.    No friction rub. No gallop.  Pulmonary:     Effort: Pulmonary effort is normal. No respiratory distress.     Breath sounds: Normal breath sounds.  Musculoskeletal:        General: No swelling. Normal range of motion.     Cervical back: Normal range of motion and neck supple.  Skin:    General: Skin is warm and dry.  Neurological:     General: No focal deficit present.     Mental Status: She is alert and oriented to person, place, and time.  Psychiatric:        Mood and Affect: Mood normal.        Behavior: Behavior normal.        Thought Content: Thought content normal.        Judgment: Judgment normal.    Adult ECG Report Not checked  Recent Labs:   October 20, 2020: TC 136, HDL 50, LDL 68, TG 77. Cr 0.79.  K+ 4.9. Lab Results  Component Value Date   CHOL 145 04/10/2020   HDL 62 04/10/2020   LDLCALC 67 04/10/2020   TRIG 86 04/10/2020   CHOLHDL 2.3 04/10/2020   Lab Results  Component Value Date   CREATININE 0.82  04/10/2020   BUN 22 04/10/2020   NA 141 04/10/2020   K 5.0 04/10/2020   CL 103 04/10/2020   CO2 25 04/10/2020   CBC Latest Ref Rng & Units 10/21/2019 10/18/2019 07/06/2019  WBC 4.0 - 10.5 K/uL 11.1(H) 7.6 13.0(H)  Hemoglobin 12.0 - 15.0 g/dL 10.6(L) 13.2 10.1(L)  Hematocrit 36.0 - 46.0 % 33.6(L) 41.9 32.1(L)  Platelets 150 - 400 K/uL 241 291 239    No results found for: HGBA1C No results found for: TSH  ==================================================  COVID-19 Education: The signs and symptoms of COVID-19 were discussed with the patient and how to seek care for testing (follow up with PCP or arrange E-visit).    I spent a total of 28 minutes with the patient spent in direct patient consultation.  Additional time spent with chart review  / charting (studies, outside notes, etc): 15 min Total Time: 43 min  Current medicines are reviewed at length with the patient today.  (+/- concerns) n/a  This visit occurred during the SARS-CoV-2 public health emergency.  Safety protocols were in place, including screening questions prior to the visit, additional usage of staff PPE, and extensive cleaning of exam room while observing appropriate contact time as indicated for disinfecting solutions.  Notice: This dictation was prepared with Dragon dictation along with smaller phrase technology. Any transcriptional errors that result from this process are unintentional and may not be corrected upon review.  Patient Instructions / Medication Changes & Studies & Tests Ordered   Patient Instructions  Medication Instructions:  No changes   *If you need a refill on your cardiac medications before your next appointment, please call your pharmacy*   Lab Work: [place] in Jan 2023-- FASTING  If you have labs (blood work) drawn today and your  tests are completely normal, you will receive your results only by: MyChart Message (if you have MyChart) OR A paper copy in the mail If you have any lab  test that is abnormal or we need to change your treatment, we will call you to review the results.   Testing/Procedures: Not needed   Follow-Up: At [facility], you and your health needs are our priority.  As part of our continuing mission to provide you with exceptional heart care, we have created designated Provider Care Teams.  These Care Teams include your primary Cardiologist (physician) and Advanced Practice Providers (APPs -  Physician Assistants and Nurse Practitioners) who all work together to provide you with the care you need, when you need it.     Your next appointment:   6 month(s)  The format for your next appointment:   In Person  Provider:   You will see one of the following Advanced Practice Providers on your designated Care Team:   Rosaria Ferries, PA-C Caron Presume, PA-C Jory Sims, DNP, ANP  Then, Glenetta Hew, MD will plan to see you again in 12 month(s).     Studies Ordered:   Orders Placed This Encounter  Procedures   Lipid panel   Comprehensive metabolic panel     Glenetta Hew, M.D., M.S. Interventional Cardiologist   Pager # [phone] Phone # [phone] [address] [place], [place] [address]   Thank you for choosing Heartcare at [facility]!!

## 2021-06-22 NOTE — Patient Instructions (Signed)
Medication Instructions:  No changes   *If you need a refill on your cardiac medications before your next appointment, please call your pharmacy*   Lab Work: [place] in Jan 2023-- FASTING  If you have labs (blood work) drawn today and your tests are completely normal, you will receive your results only by: [place] (if you have MyChart) OR A paper copy in the mail If you have any lab test that is abnormal or we need to change your treatment, we will call you to review the results.   Testing/Procedures: Not needed   Follow-Up: At [facility], you and your health needs are our priority.  As part of our continuing mission to provide you with exceptional heart care, we have created designated Provider Care Teams.  These Care Teams include your primary Cardiologist (physician) and Advanced Practice Providers (APPs -  Physician Assistants and Nurse Practitioners) who all work together to provide you with the care you need, when you need it.     Your next appointment:   6 month(s)  The format for your next appointment:   In Person  Provider:   You will see one of the following Advanced Practice Providers on your designated Care Team:   Rosaria Ferries, PA-C Caron Presume, PA-C Jory Sims, DNP, ANP  Then, Glenetta Hew, MD will plan to see you again in 12 month(s).

## 2021-06-26 DIAGNOSIS — Z23 Encounter for immunization: Secondary | ICD-10-CM | POA: Diagnosis not present

## 2021-07-02 DIAGNOSIS — H524 Presbyopia: Secondary | ICD-10-CM | POA: Diagnosis not present

## 2021-07-02 DIAGNOSIS — Z83518 Family history of other specified eye disorder: Secondary | ICD-10-CM | POA: Diagnosis not present

## 2021-07-02 DIAGNOSIS — Z961 Presence of intraocular lens: Secondary | ICD-10-CM | POA: Diagnosis not present

## 2021-07-17 ENCOUNTER — Encounter: Payer: Self-pay | Admitting: Cardiology

## 2021-07-17 NOTE — Assessment & Plan Note (Signed)
Moderate single-vessel CAD in the LAD.  Nonobstructive disease.  Plan: Continue aggressive risk factor modification  Aspirin, statin and beta-blocker.    Continue to encourage exercise and dietary control -> congratulated her on her weight loss.

## 2021-07-17 NOTE — Assessment & Plan Note (Signed)
She had 1 spell since her last visit that was broken with vagal maneuvers.  She is very happy to see if that worked.  Otherwise has been very stable on current dose of Toprol.  No changes.  We refreshed her education on vagal maneuvers and also avoiding triggers and staying out of hydrated.

## 2021-07-17 NOTE — Assessment & Plan Note (Signed)
Well-controlled blood pressure on current dose of Toprol.  On beta-blocker because of SVT.

## 2021-07-17 NOTE — Assessment & Plan Note (Signed)
With moderate LAD disease, plan is aggressive medical management including aggressive lipid control.  Last LDL was 68 as of January of this year.  Due for labs recheck in January. Continue current dose of atorvastatin.  Tolerating well with no myalgias or memory issues.

## 2021-10-15 ENCOUNTER — Encounter: Payer: Self-pay | Admitting: Cardiology

## 2021-10-15 DIAGNOSIS — I471 Supraventricular tachycardia: Secondary | ICD-10-CM

## 2021-10-15 NOTE — Telephone Encounter (Signed)
Sure, it is okay to order.

## 2021-11-09 DIAGNOSIS — I471 Supraventricular tachycardia: Secondary | ICD-10-CM | POA: Diagnosis not present

## 2021-11-09 DIAGNOSIS — E785 Hyperlipidemia, unspecified: Secondary | ICD-10-CM | POA: Diagnosis not present

## 2021-11-09 DIAGNOSIS — I2510 Atherosclerotic heart disease of native coronary artery without angina pectoris: Secondary | ICD-10-CM | POA: Diagnosis not present

## 2021-11-09 LAB — COMPREHENSIVE METABOLIC PANEL
ALT: 37 IU/L — ABNORMAL HIGH (ref 0–32)
AST: 28 IU/L (ref 0–40)
Albumin/Globulin Ratio: 2.6 — ABNORMAL HIGH (ref 1.2–2.2)
Albumin: 4.4 g/dL (ref 3.8–4.8)
Alkaline Phosphatase: 59 IU/L (ref 44–121)
BUN/Creatinine Ratio: 25 (ref 12–28)
BUN: 21 mg/dL (ref 8–27)
Bilirubin Total: 0.7 mg/dL (ref 0.0–1.2)
CO2: 24 mmol/L (ref 20–29)
Calcium: 9.3 mg/dL (ref 8.7–10.3)
Chloride: 97 mmol/L (ref 96–106)
Creatinine, Ser: 0.85 mg/dL (ref 0.57–1.00)
Globulin, Total: 1.7 g/dL (ref 1.5–4.5)
Glucose: 90 mg/dL (ref 70–99)
Potassium: 4.7 mmol/L (ref 3.5–5.2)
Sodium: 134 mmol/L (ref 134–144)
Total Protein: 6.1 g/dL (ref 6.0–8.5)
eGFR: 74 mL/min/{1.73_m2} (ref >59)

## 2021-11-09 LAB — LIPID PANEL
Chol/HDL Ratio: 2.3 ratio (ref 0.0–4.4)
Cholesterol, Total: 137 mg/dL (ref 100–199)
HDL: 60 mg/dL (ref >39)
LDL Chol Calc (NIH): 66 mg/dL (ref 0–99)
Triglycerides: 48 mg/dL (ref 0–149)
VLDL Cholesterol Cal: 11 mg/dL (ref 5–40)

## 2021-11-09 LAB — TSH: TSH: 1.15 u[IU]/mL (ref 0.450–4.500)

## 2021-11-20 DIAGNOSIS — E7800 Pure hypercholesterolemia, unspecified: Secondary | ICD-10-CM | POA: Diagnosis not present

## 2021-11-20 DIAGNOSIS — Z0001 Encounter for general adult medical examination with abnormal findings: Secondary | ICD-10-CM | POA: Diagnosis not present

## 2021-11-20 DIAGNOSIS — E559 Vitamin D deficiency, unspecified: Secondary | ICD-10-CM | POA: Diagnosis not present

## 2021-11-20 DIAGNOSIS — M810 Age-related osteoporosis without current pathological fracture: Secondary | ICD-10-CM | POA: Diagnosis not present

## 2021-11-20 DIAGNOSIS — Z79899 Other long term (current) drug therapy: Secondary | ICD-10-CM | POA: Diagnosis not present

## 2021-11-20 DIAGNOSIS — R7303 Prediabetes: Secondary | ICD-10-CM | POA: Diagnosis not present

## 2021-11-20 DIAGNOSIS — I10 Essential (primary) hypertension: Secondary | ICD-10-CM | POA: Diagnosis not present

## 2021-11-28 ENCOUNTER — Ambulatory Visit: Payer: Medicare Other | Admitting: Physician Assistant

## 2021-11-28 ENCOUNTER — Other Ambulatory Visit: Payer: Self-pay

## 2021-11-28 ENCOUNTER — Encounter: Payer: Self-pay | Admitting: Physician Assistant

## 2021-11-28 VITALS — BP 110/72 | HR 59 | Resp 20 | Ht 65.0 in | Wt 199.6 lb

## 2021-11-28 DIAGNOSIS — I471 Supraventricular tachycardia: Secondary | ICD-10-CM | POA: Diagnosis not present

## 2021-11-28 DIAGNOSIS — R7401 Elevation of levels of liver transaminase levels: Secondary | ICD-10-CM

## 2021-11-28 DIAGNOSIS — E785 Hyperlipidemia, unspecified: Secondary | ICD-10-CM

## 2021-11-28 DIAGNOSIS — I10 Essential (primary) hypertension: Secondary | ICD-10-CM

## 2021-11-28 DIAGNOSIS — I2510 Atherosclerotic heart disease of native coronary artery without angina pectoris: Secondary | ICD-10-CM

## 2021-11-28 DIAGNOSIS — D649 Anemia, unspecified: Secondary | ICD-10-CM

## 2021-11-28 NOTE — Progress Notes (Signed)
Cardiology Office Note:    Date:  11/30/2021   ID:  Cynthia [name], DOB June 28, 1951, MRN [id number]  PCP:  Maurice Small, MD   [facility] Providers Cardiologist:  Glenetta Hew, MD     Referring MD: Maurice Small, MD   Chief Complaint  Patient presents with   Follow-up    Seen for Dr. Ellyn [name]    History of Present Illness:    Cynthia [name] is a 71 y.o. female with a hx of PSVT, CAD, GERD, hypertension, hyperlipidemia, and Raynaud's disease.  Patient presented to [facility] ED in August 2020 with an episode of SVT broken with IV adenosine.  She had transient inferior ST elevation and eventually underwent cardiac catheterization that revealed moderate 60% proximal LAD lesion, medical therapy was recommended.  Patient was last seen by Dr. Ellyn [name] on 10/22/2020 at which time she was trying for exercise.  She did mention she had 1 spell of SVT which was broken with vagal maneuver.  Patient presents today for 65-month follow-up.  She has not had any SVT episode.  We discussed vagal maneuvers and carotid massage.  She is aware that she may take additional half a tablet of metoprolol succinate as needed if she has prolonged SVT.  Recent blood work shows borderline elevated ALT, looking back, she had a borderline elevated ALT 2 years ago.  I question if she potentially has fatty liver disease.  For the time being, given reassuring lab work, I recommended continue observation.  Otherwise she has not had any exertional chest pain or worsening dyspnea.  She stays fairly active.  I recommend a 37-month follow-up with Dr. Ellyn [name].  Past Medical History:  Diagnosis Date   Anemia    Anxiety disorder    Aortic valve sclerosis 05/24/2019   Moderate noted on ECHO   Arthritis    right knee   Cancer of the skin, basal cell    Coronary artery disease    Moderate to severe, non-critical single-vessel coronary artery disease with 60-70% proximal LAD stenosis at origin of large diagonal branch ([place]  1,0,0).   Demand ischemia ([place]) 05/24/2019   Depression    GERD (gastroesophageal reflux disease)    TUMS   Hypertension    Lateral meniscus derangement, right    Raynaud's phenomenon    questionable because hands get white when cold   SVT (supraventricular tachycardia) ([place])     Past Surgical History:  Procedure Laterality Date   CARDIOVERSION  05/24/2019   Adenosine   CATARACT EXTRACTION, BILATERAL     CESAREAN SECTION     x3   CHONDROPLASTY Right 02/21/2017   Procedure: CHONDROPLASTY;  Surgeon: Melrose Nakayama, MD;  Location: [facility];  Service: Orthopedics;  Laterality: Right;   COLONOSCOPY     KNEE ARTHROSCOPY WITH LATERAL MENISECTOMY Right 02/21/2017   Procedure: KNEE ARTHROSCOPY WITH LATERAL MENISECTOMY;  Surgeon: Melrose Nakayama, MD;  Location: [facility];  Service: Orthopedics;  Laterality: Right;   KNEE ARTHROSCOPY WITH MEDIAL MENISECTOMY Right 02/21/2017   Procedure: KNEE ARTHROSCOPY WITH MEDIAL MENISECTOMY;  Surgeon: Melrose Nakayama, MD;  Location: [facility];  Service: Orthopedics;  Laterality: Right;   LEFT HEART CATH AND CORONARY ANGIOGRAPHY N/A 05/24/2019   Procedure: LEFT HEART CATH AND CORONARY ANGIOGRAPHY;  Surgeon: Nelva Bush, MD;  Location: [facility];  Service: Cardiovascular;  Laterality: N/A;   MOUTH SURGERY     TOTAL KNEE ARTHROPLASTY Right 07/05/2019   Procedure: RIGHT TOTAL KNEE ARTHROPLASTY;  Surgeon: Frederik Pear, MD;  Location: [facility];  Service: Orthopedics;  Laterality: Right;   TOTAL KNEE ARTHROPLASTY Left 10/20/2019   Procedure: LEFT TOTAL KNEE ARTHROPLASTY;  Surgeon: Frederik Pear, MD;  Location: [facility];  Service: Orthopedics;  Laterality: Left;   Yag Capsulotomy Left 06/30/2019    Current Medications: Current Meds  Medication Sig   ALPRAZolam (XANAX) 0.5 MG tablet Take 0.25 mg by mouth at bedtime as needed for anxiety.    aspirin EC 81 MG tablet Take 81 mg by mouth daily.   atorvastatin  (LIPITOR) 40 MG tablet TAKE 1 TABLET BY MOUTH ONCE DAILY AT 6 PM   buPROPion (WELLBUTRIN XL) 300 MG 24 hr tablet Take 300 mg by mouth daily.   Calcium Carbonate-Vitamin D (CALTRATE 600+D PO) Take 1 tablet by mouth daily.   ergocalciferol (VITAMIN D2) 1.25 MG (50000 UT) capsule Vitamin D2 1,250 mcg (50,000 unit) capsule   ferrous sulfate 325 (65 FE) MG tablet Take 325 mg by mouth daily with breakfast.   metoprolol succinate (TOPROL-XL) 50 MG 24 hr tablet TAKE 1 TABLET BY MOUTH ONCE DAILY (TAKE WITH OR IMMEDIATELY FOLLOWING A MEAL)   Multiple Vitamins-Minerals (MULTIVITAMIN ADULT PO) Take 1 tablet by mouth daily.   nitrofurantoin, macrocrystal-monohydrate, (MACROBID) 100 MG capsule Take 100 mg by mouth 2 (two) times daily.   nitroGLYCERIN (NITROSTAT) 0.4 MG SL tablet Place 1 tablet (0.4 mg total) under the tongue every 5 (five) minutes x 3 doses as needed for chest pain.   nystatin-triamcinolone (MYCOLOG II) cream Apply 1 application topically daily as needed for rash.     Allergies:   Penicillins   Social History   Socioeconomic History   Marital status: Married    Spouse name: Not on file   Number of children: Not on file   Years of education: Not on file   Highest education level: Not on file  Occupational History   Not on file  Tobacco Use   Smoking status: Never   Smokeless tobacco: Never  Vaping Use   Vaping Use: Never used  Substance and Sexual Activity   Alcohol use: Not Currently    Comment: social   Drug use: Never   Sexual activity: Not on file  Other Topics Concern   Not on file  Social History Narrative   ** Merged History Encounter **       Completed COVID-19 vaccine in February 2021   Social Determinants of Health   Financial Resource Strain: Not on file  Food Insecurity: Not on file  Transportation Needs: Not on file  Physical Activity: Not on file  Stress: Not on file  Social Connections: Not on file     Family History: The patient's family history  includes Hypertension in her father.  ROS:   Please see the history of present illness.     All other systems reviewed and are negative.  EKGs/Labs/Other Studies Reviewed:    The following studies were reviewed today:  Echo 05/24/2019 1. The left ventricle has normal systolic function, with an ejection  fraction of 55-60%. The cavity size was normal. Left ventricular diastolic  parameters were normal. No evidence of left ventricular regional wall  motion abnormalities.   2. The aortic valve is tricuspid. Moderate sclerosis of the aortic valve.   3. The aorta is normal unless otherwise noted.    Cath 05/24/2019 Conclusions: Moderate to severe, non-critical single-vessel coronary artery disease with 60-70% proximal LAD stenosis at origin of large diagonal branch ([place] 1,0,0). Low  normal left ventricular systolic function with subtle anterior hypokinesis. Mildly elevated left ventricular filling pressure.   Recommendations: Medical therapy for coronary artery disease and supraventricular tachycardia.  I suspect chest pain and elevated HS-TnI are manifestations of demand ischemia.  Of note, inferior ST elevation had resolved by the time the patient arrived in the cath lab based on EMS EKG and chest pain had abated by the end of the case. If patient has recurrent angina, consider functional study to assess hemodynamic significance of proximal LAD stenosis. Obtain transthoracic echocardiogram to confirm left ventricular systolic function and to exclude other structural abnormalities. Consider electrophysiology consultation if recurrent SVT is noted despite medical therapy.   EKG:  EKG is ordered today.  The ekg ordered today demonstrates sinus bradycardia, heart rate 59 bpm, no significant ST-T wave changes  Recent Labs: 11/09/2021: ALT 37; BUN 21; Creatinine, Ser 0.85; Potassium 4.7; Sodium 134; TSH 1.150  Recent Lipid Panel    Component Value Date/Time   CHOL 137 11/09/2021 0828    TRIG 48 11/09/2021 0828   HDL 60 11/09/2021 0828   CHOLHDL 2.3 11/09/2021 0828   CHOLHDL 3.1 05/24/2019 1041   VLDL 35 05/24/2019 1041   LDLCALC 66 11/09/2021 0828     Risk Assessment/Calculations:           Physical Exam:    VS:  BP 110/72 (BP Location: Left Arm, Patient Position: Sitting, Cuff Size: Normal)    Pulse (!) 59    Resp 20    Ht 5\' 5"  (1.651 m)    Wt 199 lb 9.6 oz (90.5 kg)    SpO2 97%    BMI 33.22 kg/m     Wt Readings from Last 3 Encounters:  11/28/21 199 lb 9.6 oz (90.5 kg)  06/22/21 194 lb 3.2 oz (88.1 kg)  12/20/20 205 lb 3.2 oz (93.1 kg)     GEN:  Well nourished, well developed in no acute distress HEENT: Normal NECK: No JVD; No carotid bruits LYMPHATICS: No lymphadenopathy CARDIAC: RRR, no murmurs, rubs, gallops RESPIRATORY:  Clear to auscultation without rales, wheezing or rhonchi  ABDOMEN: Soft, non-tender, non-distended MUSCULOSKELETAL:  No edema; No deformity  SKIN: Warm and dry NEUROLOGIC:  Alert and oriented x 3 PSYCHIATRIC:  Normal affect   ASSESSMENT:    1. PSVT (paroxysmal supraventricular tachycardia) ([place])   2. Essential hypertension   3. Coronary artery disease involving native coronary artery of native heart without angina pectoris   4. Hyperlipidemia with target LDL less than 70   5. Elevated transaminase level    PLAN:    In order of problems listed above:  Paroxysmal SVT: No recent SVT.  Continue on metoprolol succinate.  Patient is aware of Valsalva and vagal maneuver to help terminate SVT if there is recurrence.  She is also aware to take additional half a tablet of metoprolol succinate on a as needed basis for increased palpitation  Hypertension: Blood pressure stable on current therapy.  CAD: Denies any recent chest discomfort  Hyperlipidemia: Continue Lipitor.  Borderline elevated ALT which is chronic.  Elevated ALT: Recent blood work showed ALT 37, I suspect patient likely has fatty liver disease.  No need to decrease  Lipitor dosage as this is chronic for her.           Medication Adjustments/Labs and Tests Ordered: Current medicines are reviewed at length with the patient today.  Concerns regarding medicines are outlined above.  Orders Placed This Encounter  Procedures   EKG 12-Lead  No orders of the defined types were placed in this encounter.   Patient Instructions  Medication Instructions:  Your physician recommends that you continue on your current medications as directed. Please refer to the Current Medication list given to you today.   *If you need a refill on your cardiac medications before your next appointment, please call your pharmacy*   Lab Work: NONE ordered at this time of appointment    Testing/Procedures: NONE ordered at this time of appointment    Follow-Up: At [facility], you and your health needs are our priority.  As part of our continuing mission to provide you with exceptional heart care, we have created designated Provider Care Teams.  These Care Teams include your primary Cardiologist (physician) and Advanced Practice Providers (APPs -  Physician Assistants and Nurse Practitioners) who all work together to provide you with the care you need, when you need it.  We recommend signing up for the patient portal called "MyChart".  Sign up information is provided on this After Visit Summary.  MyChart is used to connect with patients for Virtual Visits (Telemedicine).  Patients are able to view lab/test results, encounter notes, upcoming appointments, etc.  Non-urgent messages can be sent to your provider as well.   To learn more about what you can do with MyChart, go to [email or web address].    Your next appointment:   6 month(s)  The format for your next appointment:   In Person  Provider:   Glenetta Hew, MD     Other Instructions    Signed, Almyra Deforest, [place]  11/30/2021 1:35 PM    [place]

## 2021-11-28 NOTE — Patient Instructions (Signed)
Medication Instructions:  ?Your physician recommends that you continue on your current medications as directed. Please refer to the Current Medication list given to you today.  ? ?*If you need a refill on your cardiac medications before your next appointment, please call your pharmacy* ? ? ?Lab Work: ?NONE ordered at this time of appointment  ? ? ?Testing/Procedures: ?NONE ordered at this time of appointment  ? ? ?Follow-Up: ?At [facility], you and your health needs are our priority.  As part of our continuing mission to provide you with exceptional heart care, we have created designated Provider Care Teams.  These Care Teams include your primary Cardiologist (physician) and Advanced Practice Providers (APPs -  Physician Assistants and Nurse Practitioners) who all work together to provide you with the care you need, when you need it. ? ?We recommend signing up for the patient portal called "MyChart".  Sign up information is provided on this After Visit Summary.  MyChart is used to connect with patients for Virtual Visits (Telemedicine).  Patients are able to view lab/test results, encounter notes, upcoming appointments, etc.  Non-urgent messages can be sent to your provider as well.   ?To learn more about what you can do with MyChart, go to [email or web address].   ? ?Your next appointment:   ?6 month(s) ? ?The format for your next appointment:   ?In Person ? ?Provider:   ?Glenetta Hew, MD   ? ? ?Other Instructions ? ?

## 2021-11-30 ENCOUNTER — Encounter: Payer: Self-pay | Admitting: Physician Assistant

## 2021-12-07 ENCOUNTER — Other Ambulatory Visit: Payer: Self-pay | Admitting: Family Medicine

## 2021-12-11 ENCOUNTER — Other Ambulatory Visit: Payer: Self-pay | Admitting: Family Medicine

## 2021-12-11 DIAGNOSIS — M810 Age-related osteoporosis without current pathological fracture: Secondary | ICD-10-CM

## 2021-12-27 DIAGNOSIS — Z1231 Encounter for screening mammogram for malignant neoplasm of breast: Secondary | ICD-10-CM | POA: Diagnosis not present

## 2021-12-27 DIAGNOSIS — Z124 Encounter for screening for malignant neoplasm of cervix: Secondary | ICD-10-CM | POA: Diagnosis not present

## 2021-12-27 DIAGNOSIS — Z01419 Encounter for gynecological examination (general) (routine) without abnormal findings: Secondary | ICD-10-CM | POA: Diagnosis not present

## 2022-01-23 DIAGNOSIS — L218 Other seborrheic dermatitis: Secondary | ICD-10-CM | POA: Diagnosis not present

## 2022-01-23 DIAGNOSIS — D2262 Melanocytic nevi of left upper limb, including shoulder: Secondary | ICD-10-CM | POA: Diagnosis not present

## 2022-01-23 DIAGNOSIS — D225 Melanocytic nevi of trunk: Secondary | ICD-10-CM | POA: Diagnosis not present

## 2022-01-23 DIAGNOSIS — Z85828 Personal history of other malignant neoplasm of skin: Secondary | ICD-10-CM | POA: Diagnosis not present

## 2022-01-24 ENCOUNTER — Telehealth: Payer: Self-pay | Admitting: Cardiology

## 2022-01-24 NOTE — Telephone Encounter (Signed)
As requested, sent message through mychart. ? ? ? ? Hello  Ms Fair, ? ?  We received the message you want to know if you needed labs before next appointment in Sept. 2023. ? ? Reviewing your chart. You had lab work done in Nov 09, 2021.   Lipid panel was excellent. ? Unless you had major change in  your diet or activity since then , you do not need  lab before  your Sept appointment.   ? If your primary needs to recheck  because you need an annual medicare wellness check that would be okay. ? ?Again from a cardiac standpoint, you do not need lab work. ? ? ?Have a good day ?Ivin Booty RN ?

## 2022-01-24 NOTE — Telephone Encounter (Signed)
Patient wanted to know if she needs to have repeat labs done prior to her appointment in September. Please reach out to patient via MyChart  ?

## 2022-02-15 ENCOUNTER — Other Ambulatory Visit: Payer: Self-pay | Admitting: Cardiology

## 2022-03-17 ENCOUNTER — Other Ambulatory Visit: Payer: Self-pay | Admitting: Cardiology

## 2022-05-21 ENCOUNTER — Ambulatory Visit
Admission: RE | Admit: 2022-05-21 | Discharge: 2022-05-21 | Disposition: A | Discharge status: Home / Self Care | Payer: Medicare Other | Source: Ambulatory Visit | Attending: Family Medicine | Admitting: Family Medicine

## 2022-05-21 DIAGNOSIS — M810 Age-related osteoporosis without current pathological fracture: Secondary | ICD-10-CM

## 2022-05-21 DIAGNOSIS — M85851 Other specified disorders of bone density and structure, right thigh: Secondary | ICD-10-CM | POA: Diagnosis not present

## 2022-05-21 DIAGNOSIS — Z780 Asymptomatic menopausal state: Secondary | ICD-10-CM | POA: Diagnosis not present

## 2022-05-22 DIAGNOSIS — M810 Age-related osteoporosis without current pathological fracture: Secondary | ICD-10-CM | POA: Diagnosis not present

## 2022-05-22 DIAGNOSIS — F419 Anxiety disorder, unspecified: Secondary | ICD-10-CM | POA: Diagnosis not present

## 2022-06-10 ENCOUNTER — Encounter: Payer: Self-pay | Admitting: Cardiology

## 2022-06-10 ENCOUNTER — Ambulatory Visit: Payer: Medicare Other | Attending: Cardiology | Admitting: Cardiology

## 2022-06-10 DIAGNOSIS — I10 Essential (primary) hypertension: Secondary | ICD-10-CM

## 2022-06-10 DIAGNOSIS — I471 Supraventricular tachycardia: Secondary | ICD-10-CM | POA: Diagnosis not present

## 2022-06-10 DIAGNOSIS — E785 Hyperlipidemia, unspecified: Secondary | ICD-10-CM

## 2022-06-10 DIAGNOSIS — I2510 Atherosclerotic heart disease of native coronary artery without angina pectoris: Secondary | ICD-10-CM | POA: Diagnosis not present

## 2022-06-10 MED ORDER — NITROGLYCERIN 0.4 MG SL SUBL
0.4000 mg | SUBLINGUAL_TABLET | SUBLINGUAL | 3 refills | Status: AC | PRN
Start: 2022-06-10 — End: ?

## 2022-06-10 NOTE — Progress Notes (Signed)
Primary Care Provider: Maurice Small, MD [place] Cardiologist: Glenetta Hew, MD Electrophysiologist: None  Clinic Note: Chief Complaint  Patient presents with   Follow-up    Doing well.  No major complaints.   Tachycardia    Only 1 spell of fast heart rates, broke without vagal maneuver.    ===================================  ASSESSMENT/PLAN   Problem List Items Addressed This Visit       Cardiology Problems   Coronary artery disease, 60% LAD prior to D1 (Chronic)    Moderate single-vessel nonobstructive disease involving LAD 60% stenosis.  Plan: Continue atorvastatin, aspirin, Toprol. Continue nonmedical therapies such as exercise and diet control.  Work on weight loss.      Relevant Medications   nitroGLYCERIN (NITROSTAT) 0.4 MG SL tablet   Essential hypertension (Chronic)    Blood pressure looks great on current dose of beta-blocker.  She takes 50 mg Toprol with excellent blood pressure control.      Relevant Medications   nitroGLYCERIN (NITROSTAT) 0.4 MG SL tablet   Hyperlipidemia with target LDL less than 70 (Chronic)    She is on atorvastatin 40 mg daily.  Labs as of February looked good. I suspect that she should be having her blood work checked if not this year, by February.      Relevant Medications   nitroGLYCERIN (NITROSTAT) 0.4 MG SL tablet   SVT (supraventricular tachycardia) (HCC) (Chronic)    Maybe 1 short episode in the last 12 months of tachycardia.  Overall pretty stable on current dose of Toprol.  We reviewed vagal maneuvers and adequate hydration.      Relevant Medications   nitroGLYCERIN (NITROSTAT) 0.4 MG SL tablet    ===================================  HPI:    Cynthia [name] is a 71 y.o. female with a PMH notable for PSVT, Nonobstructive CAD (60% LAD proximal D1., HTN, HLD who presents today for 72-monthfollow-up.  I last saw Cynthia DIGIULIOwas last seen on June 22, 2021 doing well.  Sometimes she feels  little more tired than usual but no chest pain or pressure or significant dyspnea.  Some GERD but nothing worrisome.  She had 1 episode of fast heart rates a few months prior to the visit, but broke with vagal maneuver.  Only lasted less than a minute or so.  Try to work on losing weight, had successfully lost 10 pounds.  She was most recently seen by MEulah Citizenon November 28, 2021 -> at that time she denied any recurrent SVT episodes.  Trying to stay active.  Noted elevated LFT to use (ALT slightly elevated.  Question fatty liver disease.  Continued statin.  Recent Hospitalizations: None  Reviewed  CV studies:    The following studies were reviewed today: (if available, images/films reviewed: From Epic Chart or Care Everywhere) None:  Interval History:   Cynthia TETROreturns today doing fairly well.  No major issues.  1 spell that lasted just a few minutes of fast heart rates.  Broke before she can do vagal maneuvers. She walks on the treadmill is able to get heart rate from between 100-120 beats a minute so no signs of chronotropic incompetence.  She will get short of breath if she walks briskly or longer distances mostly because of deconditioning.  Has not really been all that good with her exercise.  She has had ups and downs in weight, somewhat frustrated.  No chest pain or pressure with rest or exertion.  No PND, or orthopnea, but does  have some mild end of day swelling that goes down with foot elevation.  No prolonged tachycardic spells no signs of arrhythmia.  No symptoms of syncope/near syncope, TIA/amaurosis fugax or claudication.  She does still have some anxiety and insomnia issues.  REVIEWED OF SYSTEMS   Pertinent symptoms noted above, otherwise Review of Systems  Constitutional:  Negative for malaise/fatigue (She is deconditioned) and weight loss (Weight is going up and down).  Respiratory:  Negative for cough and wheezing.        Per HPI  Cardiovascular:  Negative for  leg swelling.  Gastrointestinal:  Negative for blood in stool and melena.  Musculoskeletal:  Positive for joint pain (Mild joint pains).  Neurological:  Positive for dizziness. Negative for loss of consciousness.  Psychiatric/Behavioral:  Negative for depression and memory loss. The patient has insomnia. The patient is not nervous/anxious.    I have reviewed and (if needed) personally updated the patient's problem list, medications, allergies, past medical and surgical history, social and family history.   PAST MEDICAL HISTORY   Past Medical History:  Diagnosis Date   Anemia    Anxiety disorder    Aortic valve sclerosis 05/24/2019   Moderate noted on ECHO   Arthritis    right knee   Cancer of the skin, basal cell    Coronary artery disease    Moderate to severe, non-critical single-vessel coronary artery disease with 60-70% proximal LAD stenosis at origin of large diagonal branch ([place] 1,0,0).   Demand ischemia ([place]) 05/24/2019   Depression    GERD (gastroesophageal reflux disease)    TUMS   Hypertension    Lateral meniscus derangement, right    Raynaud's phenomenon    questionable because hands get white when cold   SVT (supraventricular tachycardia) ([place])     PAST SURGICAL HISTORY   Past Surgical History:  Procedure Laterality Date   CARDIOVERSION  05/24/2019   Adenosine   CATARACT EXTRACTION, BILATERAL     CESAREAN SECTION     x3   CHONDROPLASTY Right 02/21/2017   Procedure: CHONDROPLASTY;  Surgeon: Melrose Nakayama, MD;  Location: [facility];  Service: Orthopedics;  Laterality: Right;   COLONOSCOPY     KNEE ARTHROSCOPY WITH LATERAL MENISECTOMY Right 02/21/2017   Procedure: KNEE ARTHROSCOPY WITH LATERAL MENISECTOMY;  Surgeon: Melrose Nakayama, MD;  Location: [facility];  Service: Orthopedics;  Laterality: Right;   KNEE ARTHROSCOPY WITH MEDIAL MENISECTOMY Right 02/21/2017   Procedure: KNEE ARTHROSCOPY WITH MEDIAL MENISECTOMY;  Surgeon:  Melrose Nakayama, MD;  Location: [facility];  Service: Orthopedics;  Laterality: Right;   LEFT HEART CATH AND CORONARY ANGIOGRAPHY N/A 05/24/2019   Procedure: LEFT HEART CATH AND CORONARY ANGIOGRAPHY;  Surgeon: Nelva Bush, MD;  Location: [facility];  Service: Cardiovascular;  Laterality: N/A;   MOUTH SURGERY     TOTAL KNEE ARTHROPLASTY Right 07/05/2019   Procedure: RIGHT TOTAL KNEE ARTHROPLASTY;  Surgeon: Frederik Pear, MD;  Location: [facility];  Service: Orthopedics;  Laterality: Right;   TOTAL KNEE ARTHROPLASTY Left 10/20/2019   Procedure: LEFT TOTAL KNEE ARTHROPLASTY;  Surgeon: Frederik Pear, MD;  Location: [facility];  Service: Orthopedics;  Laterality: Left;   Yag Capsulotomy Left 06/30/2019   LEFT HEART CATH-CORONARY ANGIOGRAPHY 05/24/2019: Moderate-severe noncritical single-vessel CAD-60-70% proximal LAD at origin of large D1 ([place] 1,0, 0)-recommend medical management. t    Immunization History  Administered Date(s) Administered   PFIZER(Purple Top)SARS-COV-2 Vaccination 11/06/2019, 12/01/2019, 07/11/2020    MEDICATIONS/ALLERGIES   Current Meds  Medication Sig   ALPRAZolam (XANAX) 0.5 MG tablet Take 0.25 mg by mouth at bedtime as needed for anxiety.    aspirin EC 81 MG tablet Take 81 mg by mouth daily.   atorvastatin (LIPITOR) 40 MG tablet TAKE ONE TABLET BY MOUTH ONCE DAILY AT 6PM   buPROPion (WELLBUTRIN XL) 300 MG 24 hr tablet Take 300 mg by mouth daily.   Calcium Carbonate-Vitamin D (CALTRATE 600+D PO) Take 1 tablet by mouth daily.   ergocalciferol (VITAMIN D2) 1.25 MG (50000 UT) capsule Vitamin D2 1,250 mcg (50,000 unit) capsule   ferrous sulfate 325 (65 FE) MG tablet Take 325 mg by mouth daily with breakfast.   metoprolol succinate (TOPROL-XL) 50 MG 24 hr tablet TAKE ONE TABLET BY MOUTH ONCE DAILY. (TAKE WITH OR IMMEDIATELY FOLLOWING A MEAL)   Multiple Vitamins-Minerals (MULTIVITAMIN ADULT PO) Take 1 tablet by mouth daily.   nitrofurantoin,  macrocrystal-monohydrate, (MACROBID) 100 MG capsule Take 100 mg by mouth 2 (two) times daily.   nystatin-triamcinolone (MYCOLOG II) cream Apply 1 application topically daily as needed for rash.   [DISCONTINUED] nitroGLYCERIN (NITROSTAT) 0.4 MG SL tablet Place 1 tablet (0.4 mg total) under the tongue every 5 (five) minutes x 3 doses as needed for chest pain.    Allergies  Allergen Reactions   Penicillins Rash    Pt does not remember, states she was real young    SOCIAL HISTORY/FAMILY HISTORY   Reviewed in Epic:  Pertinent findings:  Social History   Tobacco Use   Smoking status: Never   Smokeless tobacco: Never  Vaping Use   Vaping Use: Never used  Substance Use Topics   Alcohol use: Not Currently    Comment: social   Drug use: Never   Social History   Social History Narrative   ** Merged History Encounter **       Completed COVID-19 vaccine in February 2021    OBJCTIVE -PE, EKG, labs   Wt Readings from Last 3 Encounters:  06/10/22 207 lb 9.6 oz (94.2 kg)  11/28/21 199 lb 9.6 oz (90.5 kg)  06/22/21 194 lb 3.2 oz (88.1 kg)    Physical Exam: BP 112/70   Pulse 61   Ht '5\' 4"'$  (1.626 m)   Wt 207 lb 9.6 oz (94.2 kg)   SpO2 100%   BMI 35.63 kg/m  Physical Exam Vitals reviewed.  Constitutional:      General: She is not in acute distress.    Appearance: Normal appearance. She is not ill-appearing or toxic-appearing.     Comments: Obese, but otherwise well-groomed.  HENT:     Head: Normocephalic and atraumatic.  Neck:     Vascular: No carotid bruit or JVD.  Cardiovascular:     Rate and Rhythm: Normal rate and regular rhythm. No extrasystoles are present.    Chest Wall: PMI is not displaced.     Pulses: Normal pulses.     Heart sounds: S1 normal and S2 normal. Heart sounds are distant. Murmur (1/6 SEM at RUSB) heard.     No friction rub. No gallop.  Pulmonary:     Effort: Pulmonary effort is normal. No respiratory distress.     Breath sounds: Normal breath  sounds. No wheezing, rhonchi or rales.  Abdominal:     General: Bowel sounds are normal. There is no distension.     Palpations: Abdomen is soft.     Tenderness: There is no abdominal tenderness. There is no guarding.  Musculoskeletal:     Cervical back:  Normal range of motion and neck supple.  Skin:    General: Skin is warm and dry.  Neurological:     General: No focal deficit present.     Mental Status: She is alert and oriented to person, place, and time.  Psychiatric:        Mood and Affect: Mood normal.        Behavior: Behavior normal.        Thought Content: Thought content normal.        Judgment: Judgment normal.     Adult ECG Report Not checked  Recent Labs: Reviewed Lab Results  Component Value Date   CHOL 137 11/09/2021   HDL 60 11/09/2021   LDLCALC 66 11/09/2021   TRIG 48 11/09/2021   CHOLHDL 2.3 11/09/2021   Lab Results  Component Value Date   CREATININE 0.85 11/09/2021   BUN 21 11/09/2021   NA 134 11/09/2021   K 4.7 11/09/2021   CL 97 11/09/2021   CO2 24 11/09/2021      Latest Ref Rng & Units 10/21/2019    4:07 AM 10/18/2019   10:10 AM 07/06/2019    3:12 AM  CBC  WBC 4.0 - 10.5 K/uL 11.1  7.6  13.0   Hemoglobin 12.0 - 15.0 g/dL 10.6  13.2  10.1   Hematocrit 36.0 - 46.0 % 33.6  41.9  32.1   Platelets 150 - 400 K/uL 241  291  239     No results found for: "HGBA1C" Lab Results  Component Value Date   TSH 1.150 11/09/2021    ================================================== I spent a total of 26 minutes with the patient spent in direct patient consultation.  Additional time spent with chart review  / charting (studies, outside notes, etc): 16 min Total Time: 42 min  Current medicines are reviewed at length with the patient today.  (+/- concerns) none  Notice: This dictation was prepared with Dragon dictation along with smart phrase technology. Any transcriptional errors that result from this process are unintentional and may not be corrected  upon review.  Studies Ordered:   No orders of the defined types were placed in this encounter.  Meds ordered this encounter  Medications   nitroGLYCERIN (NITROSTAT) 0.4 MG SL tablet    Sig: Place 1 tablet (0.4 mg total) under the tongue every 5 (five) minutes x 3 doses as needed for chest pain.    Dispense:  25 tablet    Refill:  3    Patient Instructions / Medication Changes & Studies & Tests Ordered   Patient Instructions  Medication Instructions:   No changes  *If you need a refill on your cardiac medications before your next appointment, please call your pharmacy*   Lab Work:  Not needed     Testing/Procedures: Not needed   Follow-Up: At [facility], you and your health needs are our priority.  As part of our continuing mission to provide you with exceptional heart care, we have created designated Provider Care Teams.  These Care Teams include your primary Cardiologist (physician) and Advanced Practice Providers (APPs -  Physician Assistants and Nurse Practitioners) who all work together to provide you with the care you need, when you need it.  We recommend signing up for the patient portal called "MyChart".  Sign up information is provided on this After Visit Summary.  MyChart is used to connect with patients for Virtual Visits (Telemedicine).  Patients are able to view lab/test results, encounter notes, upcoming appointments, etc.  Non-urgent messages can be sent to your provider as well.   To learn more about what you can do with MyChart, go to [email or web address].    Your next appointment:   12 month(s)  The format for your next appointment:   In Person  Provider:   Glenetta Hew, MD    Other Instructions   Your physician discussed the importance of regular exercise and recommended that you start or continue a regular exercise program for good health.      Leonie Man, MD, MS Glenetta Hew, M.D., M.S. Interventional Cardiologist  [place]  Pager # [phone] Phone # [phone] [address] [place], [place] [address]   Thank you for choosing [facility] [place]!!

## 2022-06-10 NOTE — Patient Instructions (Signed)
Medication Instructions:   No changes  *If you need a refill on your cardiac medications before your next appointment, please call your pharmacy*   Lab Work:  Not needed     Testing/Procedures: Not needed   Follow-Up: At [facility], you and your health needs are our priority.  As part of our continuing mission to provide you with exceptional heart care, we have created designated Provider Care Teams.  These Care Teams include your primary Cardiologist (physician) and Advanced Practice Providers (APPs -  Physician Assistants and Nurse Practitioners) who all work together to provide you with the care you need, when you need it.  We recommend signing up for the patient portal called "MyChart".  Sign up information is provided on this After Visit Summary.  MyChart is used to connect with patients for Virtual Visits (Telemedicine).  Patients are able to view lab/test results, encounter notes, upcoming appointments, etc.  Non-urgent messages can be sent to your provider as well.   To learn more about what you can do with MyChart, go to [email or web address].    Your next appointment:   12 month(s)  The format for your next appointment:   In Person  Provider:   Glenetta Hew, MD    Other Instructions   Your physician discussed the importance of regular exercise and recommended that you start or continue a regular exercise program for good health.

## 2022-06-17 DIAGNOSIS — Z23 Encounter for immunization: Secondary | ICD-10-CM | POA: Diagnosis not present

## 2022-06-23 ENCOUNTER — Encounter: Payer: Self-pay | Admitting: Cardiology

## 2022-06-23 NOTE — Assessment & Plan Note (Signed)
Blood pressure looks great on current dose of beta-blocker.  She takes 50 mg Toprol with excellent blood pressure control.

## 2022-06-23 NOTE — Assessment & Plan Note (Signed)
Moderate single-vessel nonobstructive disease involving LAD 60% stenosis.  Plan:  Continue atorvastatin, aspirin, Toprol.  Continue nonmedical therapies such as exercise and diet control.  Work on weight loss.

## 2022-06-23 NOTE — Assessment & Plan Note (Addendum)
Maybe 1 short episode in the last 12 months of tachycardia.  Overall pretty stable on current dose of Toprol.  We reviewed vagal maneuvers and adequate hydration.

## 2022-06-23 NOTE — Assessment & Plan Note (Signed)
She is on atorvastatin 40 mg daily.  Labs as of February looked good. I suspect that she should be having her blood work checked if not this year, by February.

## 2022-07-30 DIAGNOSIS — Z961 Presence of intraocular lens: Secondary | ICD-10-CM | POA: Diagnosis not present

## 2022-07-30 DIAGNOSIS — Z83518 Family history of other specified eye disorder: Secondary | ICD-10-CM | POA: Diagnosis not present

## 2022-07-30 DIAGNOSIS — H524 Presbyopia: Secondary | ICD-10-CM | POA: Diagnosis not present

## 2022-10-28 DIAGNOSIS — K080 Exfoliation of teeth due to systemic causes: Secondary | ICD-10-CM | POA: Diagnosis not present

## 2022-11-06 DIAGNOSIS — K080 Exfoliation of teeth due to systemic causes: Secondary | ICD-10-CM | POA: Diagnosis not present

## 2022-11-25 DIAGNOSIS — R635 Abnormal weight gain: Secondary | ICD-10-CM | POA: Diagnosis not present

## 2022-11-25 DIAGNOSIS — E559 Vitamin D deficiency, unspecified: Secondary | ICD-10-CM | POA: Diagnosis not present

## 2022-11-25 DIAGNOSIS — Z0000 Encounter for general adult medical examination without abnormal findings: Secondary | ICD-10-CM | POA: Diagnosis not present

## 2022-11-25 DIAGNOSIS — I471 Supraventricular tachycardia, unspecified: Secondary | ICD-10-CM | POA: Diagnosis not present

## 2022-11-25 DIAGNOSIS — R739 Hyperglycemia, unspecified: Secondary | ICD-10-CM | POA: Diagnosis not present

## 2022-11-25 DIAGNOSIS — I10 Essential (primary) hypertension: Secondary | ICD-10-CM | POA: Diagnosis not present

## 2022-11-25 DIAGNOSIS — E7800 Pure hypercholesterolemia, unspecified: Secondary | ICD-10-CM | POA: Diagnosis not present

## 2022-11-25 DIAGNOSIS — F324 Major depressive disorder, single episode, in partial remission: Secondary | ICD-10-CM | POA: Diagnosis not present

## 2022-11-25 DIAGNOSIS — Z1159 Encounter for screening for other viral diseases: Secondary | ICD-10-CM | POA: Diagnosis not present

## 2022-11-25 DIAGNOSIS — Z23 Encounter for immunization: Secondary | ICD-10-CM | POA: Diagnosis not present

## 2022-12-21 DIAGNOSIS — H524 Presbyopia: Secondary | ICD-10-CM | POA: Diagnosis not present

## 2022-12-26 ENCOUNTER — Telehealth: Payer: Self-pay | Admitting: Cardiology

## 2022-12-26 NOTE — Telephone Encounter (Signed)
She is aware we are waiting for Dr. Ellyn Hack to return to office.

## 2022-12-26 NOTE — Telephone Encounter (Signed)
Patient ask if she could be prescribed Wegovy.  She has tried to lose weight and can't .  She states she is 36 and will never be able to lose the weight and ask for "some help".  She states she saw o  TV that it is approved for medicare if the cardiologist will prescribe it.  Discussed GI effects, which she is aware.  Discussed life style change along with this medication, if she takes it. Advised she may have to have Dr Ellyn Hack to approve and he is out of the office at this time.

## 2022-12-26 NOTE — Telephone Encounter (Signed)
Patient called wanting to know if Dr. Ellyn Hack would prescribe [facility] for her, she states she qualifies for it though medicare for it because of her BMI.  She said medicare would pay for it if it's prescribed by a cardiologist.

## 2022-12-27 NOTE — Telephone Encounter (Signed)
Not unreasonable to consider - but I have not written [facility] Rx.  Certain steps must be taken to ensure Insurance/Medicare coverage.   Would need to have her see out [facility] or possibly Dr. Harrell Gave.   Glenetta Hew, MD

## 2022-12-27 NOTE — Telephone Encounter (Addendum)
Pt does have CAD and obesity - I'll try submitting an authorization request to her insurance to see if they'll cover Wegovy under its new CV indication. It's unclear how fast insurance plans will adopt coverage. I'll give her a call to discuss once I hear back from her insurance.

## 2022-12-27 NOTE — Telephone Encounter (Signed)
Spoke with patient to give her an update.  She states thank you for being so efficient.  She will wait to hear back.

## 2022-12-30 MED ORDER — WEGOVY 0.25 MG/0.5ML ~~LOC~~ SOAJ: 0.2500 mg | SUBCUTANEOUS | 0 refills | Status: DC

## 2022-12-30 NOTE — Telephone Encounter (Addendum)
Wegovy prior Josem Kaufmann has been approved through 12/27/23. Will send in rx to see if affordable and if pharmacy can get in [place] has had backorder issues for the past few years since it's been on the market.  Called pt's pharmacy, copay is $249 and med not available due to backorder.   Called pt, she will discuss with her husband but still seems interested in trying med. She mentioned reaching out to a [facility] that said they could order med for her. Looks like [facility] may have some in stock too. She will call in to [facility] if she's able to pick up [facility] or needs rx sent in elsewhere, as she prefers to have PharmD GLP visit at [facility] where she is followed. Pt very appreciative for the follow up.

## 2022-12-30 NOTE — Addendum Note (Signed)
Addended by: Lakeyia Surber E on: 12/30/2022 10:21 AM   Modules accepted: Orders

## 2022-12-31 ENCOUNTER — Other Ambulatory Visit (HOSPITAL_COMMUNITY): Payer: Self-pay

## 2023-01-01 ENCOUNTER — Other Ambulatory Visit (HOSPITAL_COMMUNITY): Payer: Self-pay

## 2023-01-01 DIAGNOSIS — Z1231 Encounter for screening mammogram for malignant neoplasm of breast: Secondary | ICD-10-CM | POA: Diagnosis not present

## 2023-01-01 MED ORDER — SEMAGLUTIDE-WEIGHT MANAGEMENT 0.25 MG/0.5ML ~~LOC~~ SOAJ
0.2500 mg | SUBCUTANEOUS | 0 refills | Status: DC
  Filled 2023-01-01: qty 2, 28d supply, fill #0

## 2023-01-02 NOTE — Telephone Encounter (Addendum)
Call patient. Educated on how to administered the Wegovy. Patient verbalizes understanding and will try her first dose today.  Educated again on potential side effects and its management, dose titration schedule. Patient will be calling us before injecting the last dose of 0.25 mg in 4 weeks for dose titration.

## 2023-01-02 NOTE — Telephone Encounter (Signed)
   Pt said, she received her wegovy and she needs help how to use it

## 2023-01-16 ENCOUNTER — Other Ambulatory Visit (HOSPITAL_COMMUNITY): Payer: Self-pay

## 2023-01-16 ENCOUNTER — Other Ambulatory Visit: Payer: Self-pay | Admitting: Cardiology

## 2023-01-16 MED FILL — Semaglutide (Weight Mngmt) Soln Auto-Injector 0.25 MG/0.5ML: SUBCUTANEOUS | 28 days supply | Qty: 2 | Fill #0 | Status: CN

## 2023-01-22 ENCOUNTER — Other Ambulatory Visit (HOSPITAL_COMMUNITY): Payer: Self-pay

## 2023-01-22 ENCOUNTER — Encounter: Payer: Self-pay | Admitting: Cardiology

## 2023-01-22 ENCOUNTER — Other Ambulatory Visit: Payer: Self-pay | Admitting: Cardiology

## 2023-01-22 MED ORDER — WEGOVY 0.5 MG/0.5ML ~~LOC~~ SOAJ
0.5000 mg | SUBCUTANEOUS | 0 refills | Status: DC
  Filled 2023-01-22: qty 2, 28d supply, fill #0

## 2023-01-22 MED FILL — Semaglutide (Weight Mngmt) Soln Auto-Injector 0.25 MG/0.5ML: SUBCUTANEOUS | 28 days supply | Qty: 2 | Fill #0 | Status: CN

## 2023-01-22 NOTE — Telephone Encounter (Signed)
Spoke to patient  Lost 5lbs so far on Step1 Wegovy  No S/e  Have been following Weight [employer] and doing exercise 5 days per week at Gym ( 40 min cardio +15 min resistance) + walking and Stretches for the knee in the morning   Will increase dose of Wegovy to 0.5 mg once week.   Patient to report to [place] in 4 weeks via MyChart Tolerability, weight, and exercise and diet status

## 2023-01-28 DIAGNOSIS — K080 Exfoliation of teeth due to systemic causes: Secondary | ICD-10-CM | POA: Diagnosis not present

## 2023-02-13 ENCOUNTER — Encounter: Payer: Self-pay | Admitting: Cardiology

## 2023-02-13 ENCOUNTER — Other Ambulatory Visit: Payer: Self-pay | Admitting: Cardiology

## 2023-02-13 ENCOUNTER — Other Ambulatory Visit: Payer: Self-pay | Admitting: Pharmacist

## 2023-02-13 ENCOUNTER — Other Ambulatory Visit (HOSPITAL_COMMUNITY): Payer: Self-pay

## 2023-02-13 MED ORDER — WEGOVY 1 MG/0.5ML ~~LOC~~ SOAJ
1.0000 mg | SUBCUTANEOUS | 0 refills | Status: DC
  Filled 2023-02-13: qty 2, 28d supply, fill #0

## 2023-02-21 ENCOUNTER — Other Ambulatory Visit (HOSPITAL_COMMUNITY): Payer: Self-pay

## 2023-02-27 ENCOUNTER — Other Ambulatory Visit: Payer: Self-pay | Admitting: Cardiology

## 2023-02-28 ENCOUNTER — Other Ambulatory Visit: Payer: Self-pay | Admitting: Cardiology

## 2023-02-28 ENCOUNTER — Other Ambulatory Visit (HOSPITAL_COMMUNITY): Payer: Self-pay

## 2023-03-01 ENCOUNTER — Encounter: Payer: Self-pay | Admitting: Cardiology

## 2023-03-03 MED ORDER — METOPROLOL SUCCINATE ER 50 MG PO TB24: 50.0000 mg | ORAL_TABLET | Freq: Every day | ORAL | 0 refills | Status: DC

## 2023-03-12 ENCOUNTER — Other Ambulatory Visit (HOSPITAL_COMMUNITY): Payer: Self-pay

## 2023-03-12 ENCOUNTER — Other Ambulatory Visit: Payer: Self-pay | Admitting: Cardiology

## 2023-03-12 MED ORDER — WEGOVY 1.7 MG/0.75ML ~~LOC~~ SOAJ
1.7000 mg | SUBCUTANEOUS | 0 refills | Status: DC
  Filled 2023-03-12: qty 3, 28d supply, fill #0

## 2023-03-12 NOTE — Telephone Encounter (Signed)
Msg has been sent to pt to see if she wishes to increase her dose for next month.

## 2023-03-17 ENCOUNTER — Other Ambulatory Visit (HOSPITAL_COMMUNITY): Payer: Self-pay

## 2023-03-20 DIAGNOSIS — F3342 Major depressive disorder, recurrent, in full remission: Secondary | ICD-10-CM | POA: Diagnosis not present

## 2023-03-20 DIAGNOSIS — M8580 Other specified disorders of bone density and structure, unspecified site: Secondary | ICD-10-CM | POA: Diagnosis not present

## 2023-03-26 DIAGNOSIS — D2262 Melanocytic nevi of left upper limb, including shoulder: Secondary | ICD-10-CM | POA: Diagnosis not present

## 2023-03-26 DIAGNOSIS — L821 Other seborrheic keratosis: Secondary | ICD-10-CM | POA: Diagnosis not present

## 2023-03-26 DIAGNOSIS — D225 Melanocytic nevi of trunk: Secondary | ICD-10-CM | POA: Diagnosis not present

## 2023-03-26 DIAGNOSIS — Z85828 Personal history of other malignant neoplasm of skin: Secondary | ICD-10-CM | POA: Diagnosis not present

## 2023-04-10 ENCOUNTER — Other Ambulatory Visit: Payer: Self-pay | Admitting: Cardiology

## 2023-04-10 ENCOUNTER — Other Ambulatory Visit (HOSPITAL_COMMUNITY): Payer: Self-pay

## 2023-04-10 MED ORDER — WEGOVY 1.7 MG/0.75ML ~~LOC~~ SOAJ
1.7000 mg | SUBCUTANEOUS | 0 refills | Status: DC
  Filled 2023-04-10: qty 3, 28d supply, fill #0

## 2023-04-11 ENCOUNTER — Other Ambulatory Visit (HOSPITAL_COMMUNITY): Payer: Self-pay

## 2023-05-08 ENCOUNTER — Other Ambulatory Visit: Payer: Self-pay | Admitting: Cardiology

## 2023-05-08 ENCOUNTER — Other Ambulatory Visit (HOSPITAL_COMMUNITY): Payer: Self-pay

## 2023-05-08 DIAGNOSIS — I2510 Atherosclerotic heart disease of native coronary artery without angina pectoris: Secondary | ICD-10-CM

## 2023-05-08 DIAGNOSIS — E669 Obesity, unspecified: Secondary | ICD-10-CM

## 2023-05-08 MED ORDER — WEGOVY 2.4 MG/0.75ML ~~LOC~~ SOAJ
2.4000 mg | SUBCUTANEOUS | 5 refills | Status: DC
Start: 2023-05-08 — End: 2023-10-20
  Filled 2023-05-08: qty 3, 28d supply, fill #0
  Filled 2023-06-02: qty 3, 28d supply, fill #1
  Filled 2023-06-28: qty 3, 28d supply, fill #2
  Filled 2023-07-26: qty 3, 28d supply, fill #3
  Filled 2023-08-21: qty 3, 28d supply, fill #4
  Filled 2023-09-18: qty 3, 28d supply, fill #5

## 2023-05-26 DIAGNOSIS — K080 Exfoliation of teeth due to systemic causes: Secondary | ICD-10-CM | POA: Diagnosis not present

## 2023-05-26 DIAGNOSIS — I10 Essential (primary) hypertension: Secondary | ICD-10-CM | POA: Diagnosis not present

## 2023-05-26 DIAGNOSIS — R739 Hyperglycemia, unspecified: Secondary | ICD-10-CM | POA: Diagnosis not present

## 2023-05-26 DIAGNOSIS — E669 Obesity, unspecified: Secondary | ICD-10-CM | POA: Diagnosis not present

## 2023-05-26 DIAGNOSIS — E7800 Pure hypercholesterolemia, unspecified: Secondary | ICD-10-CM | POA: Diagnosis not present

## 2023-05-26 LAB — LAB REPORT - SCANNED
A1c: 4.9
EGFR: 83

## 2023-05-28 DIAGNOSIS — K080 Exfoliation of teeth due to systemic causes: Secondary | ICD-10-CM | POA: Diagnosis not present

## 2023-05-30 ENCOUNTER — Telehealth: Payer: Self-pay | Admitting: Cardiology

## 2023-05-30 ENCOUNTER — Encounter (HOSPITAL_BASED_OUTPATIENT_CLINIC_OR_DEPARTMENT_OTHER): Payer: Self-pay | Admitting: Emergency Medicine

## 2023-05-30 ENCOUNTER — Emergency Department (HOSPITAL_BASED_OUTPATIENT_CLINIC_OR_DEPARTMENT_OTHER)
Admission: EM | Admit: 2023-05-30 | Discharge: 2023-05-30 | Disposition: A | Discharge status: Home / Self Care | Payer: Medicare Other | Attending: Emergency Medicine | Admitting: Emergency Medicine

## 2023-05-30 ENCOUNTER — Other Ambulatory Visit: Payer: Self-pay

## 2023-05-30 DIAGNOSIS — U071 COVID-19: Secondary | ICD-10-CM | POA: Diagnosis not present

## 2023-05-30 DIAGNOSIS — Z7982 Long term (current) use of aspirin: Secondary | ICD-10-CM | POA: Insufficient documentation

## 2023-05-30 DIAGNOSIS — R059 Cough, unspecified: Secondary | ICD-10-CM | POA: Diagnosis not present

## 2023-05-30 DIAGNOSIS — G9340 Encephalopathy, unspecified: Secondary | ICD-10-CM | POA: Insufficient documentation

## 2023-05-30 DIAGNOSIS — K047 Periapical abscess without sinus: Secondary | ICD-10-CM | POA: Diagnosis not present

## 2023-05-30 LAB — COMPREHENSIVE METABOLIC PANEL
ALT: 37 U/L (ref 0–44)
AST: 26 U/L (ref 15–41)
Albumin: 3.9 g/dL (ref 3.5–5.0)
Alkaline Phosphatase: 46 U/L (ref 38–126)
Anion gap: 9 (ref 5–15)
BUN: 11 mg/dL (ref 8–23)
CO2: 24 mmol/L (ref 22–32)
Calcium: 8.8 mg/dL — ABNORMAL LOW (ref 8.9–10.3)
Chloride: 99 mmol/L (ref 98–111)
Creatinine, Ser: 0.78 mg/dL (ref 0.44–1.00)
GFR, Estimated: >60 mL/min (ref >60)
Glucose, Bld: 79 mg/dL (ref 70–99)
Potassium: 3.5 mmol/L (ref 3.5–5.1)
Sodium: 132 mmol/L — ABNORMAL LOW (ref 135–145)
Total Bilirubin: 0.4 mg/dL (ref 0.3–1.2)
Total Protein: 6.2 g/dL — ABNORMAL LOW (ref 6.5–8.1)

## 2023-05-30 LAB — CBC WITH DIFFERENTIAL/PLATELET
Abs Immature Granulocytes: 0.01 10*3/uL (ref 0.00–0.07)
Basophils Absolute: 0 10*3/uL (ref 0.0–0.1)
Basophils Relative: 0 %
Eosinophils Absolute: 0.1 10*3/uL (ref 0.0–0.5)
Eosinophils Relative: 2 %
HCT: 35.5 % — ABNORMAL LOW (ref 36.0–46.0)
Hemoglobin: 12.4 g/dL (ref 12.0–15.0)
Immature Granulocytes: 0 %
Lymphocytes Relative: 30 %
Lymphs Abs: 1.4 10*3/uL (ref 0.7–4.0)
MCH: 30 pg (ref 26.0–34.0)
MCHC: 34.9 g/dL (ref 30.0–36.0)
MCV: 85.7 fL (ref 80.0–100.0)
Monocytes Absolute: 0.5 10*3/uL (ref 0.1–1.0)
Monocytes Relative: 10 %
Neutro Abs: 2.7 10*3/uL (ref 1.7–7.7)
Neutrophils Relative %: 58 %
Platelets: 218 10*3/uL (ref 150–400)
RBC: 4.14 MIL/uL (ref 3.87–5.11)
RDW: 13.3 % (ref 11.5–15.5)
WBC: 4.7 10*3/uL (ref 4.0–10.5)
nRBC: 0 % (ref 0.0–0.2)

## 2023-05-30 MED ORDER — ACETAMINOPHEN 500 MG PO TABS
1000.0000 mg | ORAL_TABLET | Freq: Once | ORAL | Status: AC
  Administered 2023-05-30: 1000 mg via ORAL
  Filled 2023-05-30: qty 2

## 2023-05-30 MED ORDER — BENZONATATE 100 MG PO CAPS: 100.0000 mg | ORAL_CAPSULE | Freq: Three times a day (TID) | ORAL | 0 refills | Status: DC

## 2023-05-30 MED ORDER — ONDANSETRON 4 MG PO TBDP: ORAL_TABLET | ORAL | 0 refills | Status: DC

## 2023-05-30 MED ORDER — SODIUM CHLORIDE 0.9 % IV BOLUS
1000.0000 mL | Freq: Once | INTRAVENOUS | Status: AC
  Administered 2023-05-30: 1000 mL via INTRAVENOUS

## 2023-05-30 NOTE — ED Notes (Signed)
Pt ambulatory to restroom

## 2023-05-30 NOTE — ED Triage Notes (Signed)
Pt arrives to ED with c/o COVID positive and confusion. Family notes x2 days ago pt had a tooth extraction and tested positive for COVID. Family notes slight confusion and concern for dehydration.

## 2023-05-30 NOTE — Discharge Instructions (Signed)
I have prescribed cough and nausea medicine in case you needed.  Typically COVID is treated supportively.  The max dosing of the over-the-counter medications are listed below.  Take tylenol 2 pills 4 times a day and motrin 4 pills 3 times a day.  Drink plenty of fluids.  Return for worsening shortness of breath, headache, worsening confusion. Follow up with your family doctor.

## 2023-05-30 NOTE — Telephone Encounter (Signed)
Pt c/o medication issue:  1. Name of Medication: Semaglutide-Weight Management (WEGOVY) 2.4 MG/0.75ML SOAJ   2. How are you currently taking this medication (dosage and times per day)? Inject 2.4 mg into the skin once a week.   3. Are you having a reaction (difficulty breathing--STAT)? No  4. What is your medication issue? Patient's husband is calling because he is very concerned about the patient. She had a dental procedure on Wednesday and that night was covid+. Patient requested her husband give her the [facility] shot on Thursday. Patient's husband stated he gave her the shot, but she didn't eat much on Thursday. They ended up at the [facility] after speaking with PCP and had labs done. Patient's husband is very concerned about her health and wonders if he should of waited to give her the shot. Patient's husband stated she is having nightmares at night and is having memory issues. Please advise.

## 2023-05-30 NOTE — Telephone Encounter (Signed)
Returned call and LVM to return our call. Will send a MyChart message as well.

## 2023-05-30 NOTE — ED Provider Notes (Signed)
[place] EMERGENCY DEPARTMENT AT [facility] Provider Note   CSN: [id number] Arrival date & time: 05/30/23  1016     History  Chief Complaint  Patient presents with   Covid Positive    Cynthia [name] is a 72 y.o. female.  72 yo F with a chief complaints of confusion.  This been noticed for a couple days.  Also had some decreased oral intake for a few days.  Patient with cough congestion for about 3 to 4 days send had recently been seen for dental pain.  Was started on antibiotics for dental infection.  Plan for extraction.        Home Medications Prior to Admission medications   Medication Sig Start Date End Date Taking? Authorizing Provider  benzonatate (TESSALON) 100 MG capsule Take 1 capsule (100 mg total) by mouth every 8 (eight) hours. 05/30/23  Yes Melene Plan, DO  ondansetron (ZOFRAN-ODT) 4 MG disintegrating tablet 4mg  ODT q4 hours prn nausea/vomit 05/30/23  Yes Melene Plan, DO  ALPRAZolam Prudy Feeler) 0.5 MG tablet Take 0.25 mg by mouth at bedtime as needed for anxiety.     [provider]  aspirin EC 81 MG tablet Take 81 mg by mouth daily.    [provider]  atorvastatin (LIPITOR) 40 MG tablet TAKE ONE TABLET BY MOUTH ONCE DAILY AT 6pm 02/28/23   [name], MD  buPROPion (WELLBUTRIN XL) 300 MG 24 hr tablet Take 300 mg by mouth daily.    [provider]  Calcium Carbonate-Vitamin D (CALTRATE 600+D PO) Take 1 tablet by mouth daily.    [provider]  ergocalciferol (VITAMIN D2) 1.25 MG (50000 UT) capsule Vitamin D2 1,250 mcg (50,000 unit) capsule    [provider]  ferrous sulfate 325 (65 FE) MG tablet Take 325 mg by mouth daily with breakfast.    [provider]  metoprolol succinate (TOPROL-XL) 50 MG 24 hr tablet Take 1 tablet (50 mg total) by mouth daily. Pt. Will need to make an appointment in order to receive future refills. First attempt 03/03/23   [name], MD  Multiple Vitamins-Minerals  (MULTIVITAMIN ADULT PO) Take 1 tablet by mouth daily.    [provider]  nitrofurantoin, macrocrystal-monohydrate, (MACROBID) 100 MG capsule Take 100 mg by mouth 2 (two) times daily. 11/01/19   [provider]  nitroGLYCERIN (NITROSTAT) 0.4 MG SL tablet Place 1 tablet (0.4 mg total) under the tongue every 5 (five) minutes x 3 doses as needed for chest pain. 06/10/22   [name], MD  nystatin-triamcinolone (MYCOLOG II) cream Apply 1 application topically daily as needed for rash. 10/04/19   [provider]  Semaglutide-Weight Management (WEGOVY) 2.4 MG/0.75ML SOAJ Inject 2.4 mg into the skin once a week. 05/08/23   [name], MD      Allergies    Penicillins    Review of Systems   Review of Systems  Physical Exam Updated Vital Signs BP 121/86   Pulse 86   Temp 97.7 F (36.5 C) (Oral)   Resp 16   Ht 5\' 4"  (1.626 m)   Wt 94.2 kg   SpO2 100%   BMI 35.65 kg/m  Physical Exam Vitals and nursing note reviewed.  Constitutional:      General: She is not in acute distress.    Appearance: She is well-developed. She is not diaphoretic.  HENT:     Head: Normocephalic and atraumatic.  Eyes:     Pupils: Pupils are equal,  round, and reactive to light.  Cardiovascular:     Rate and Rhythm: Normal rate and regular rhythm.     Heart sounds: No murmur heard.    No friction rub. No gallop.  Pulmonary:     Effort: Pulmonary effort is normal.     Breath sounds: No wheezing or rales.  Abdominal:     General: There is no distension.     Palpations: Abdomen is soft.     Tenderness: There is no abdominal tenderness.  Musculoskeletal:        General: No tenderness.     Cervical back: Normal range of motion and neck supple.  Skin:    General: Skin is warm and dry.  Neurological:     Mental Status: She is alert and oriented to person, place, and time.     GCS: GCS eye subscore is 4. GCS verbal subscore is 5. GCS motor subscore is 6.     Cranial Nerves: Cranial  nerves 2-12 are intact.     Sensory: Sensation is intact.     Motor: Motor function is intact.     Coordination: Coordination is intact.  Psychiatric:        Behavior: Behavior normal.     ED Results / Procedures / Treatments   Labs (all labs ordered are listed, but only abnormal results are displayed) Labs Reviewed  CBC WITH DIFFERENTIAL/PLATELET - Abnormal; Notable for the following components:      Result Value   HCT 35.5 (*)    All other components within normal limits  COMPREHENSIVE METABOLIC PANEL - Abnormal; Notable for the following components:   Sodium 132 (*)    Calcium 8.8 (*)    Total Protein 6.2 (*)    All other components within normal limits    EKG None  Radiology No results found.  Procedures Procedures    Medications Ordered in ED Medications  sodium chloride 0.9 % bolus 1,000 mL (0 mLs Intravenous Stopped 05/30/23 1207)  acetaminophen (TYLENOL) tablet 1,000 mg (1,000 mg Oral Given 05/30/23 1053)    ED Course/ Medical Decision Making/ A&P                                 Medical Decision Making Amount and/or Complexity of Data Reviewed Labs: ordered.  Risk OTC drugs. Prescription drug management.   72 yo F with a chief complaints of testing positive for COVID at home and confusion.  This been going on for at least a few days.  Timeline is somewhat difficult because the patient also has been struggling with a dental infection.  She is on clindamycin for this.  Decreased oral intake per family.  Will give a bolus of IV fluids blood work reassess.  Patient seems to be doing a bit better on repeat assessment.  She has a very minimal drop in her sodium level.  Renal function appears to be at her baseline no significant electrolyte abnormality otherwise.  I suspect the patient most likely has an encephalopathy secondary to her viral syndrome.  I did offer admission which the patient and family are currently declining.  Will have them follow-up with her  family doctor in the office.  1:43 PM:  I have discussed the diagnosis/risks/treatment options with the patient and family.  Evaluation and diagnostic testing in the emergency department does not suggest an emergent condition requiring admission or immediate intervention beyond what has been performed at this  time.  They will follow up with PCP. We also discussed returning to the ED immediately if new or worsening sx occur. We discussed the sx which are most concerning (e.g., sudden worsening pain, fever, inability to tolerate by mouth) that necessitate immediate return. Medications administered to the patient during their visit and any new prescriptions provided to the patient are listed below.  Medications given during this visit Medications  sodium chloride 0.9 % bolus 1,000 mL (0 mLs Intravenous Stopped 05/30/23 1207)  acetaminophen (TYLENOL) tablet 1,000 mg (1,000 mg Oral Given 05/30/23 1053)     The patient appears reasonably screen and/or stabilized for discharge and I doubt any other medical condition or other [facility] requiring further screening, evaluation, or treatment in the ED at this time prior to discharge.          Final Clinical Impression(s) / ED Diagnoses Final diagnoses:  COVID-19 virus infection  Dental infection  Encephalopathy    Rx / DC Orders ED Discharge Orders          Ordered    benzonatate (TESSALON) 100 MG capsule  Every 8 hours        05/30/23 1209    ondansetron (ZOFRAN-ODT) 4 MG disintegrating tablet        05/30/23 1209              Melene Plan, DO 05/30/23 1343

## 2023-06-02 ENCOUNTER — Other Ambulatory Visit (HOSPITAL_COMMUNITY): Payer: Self-pay

## 2023-06-03 ENCOUNTER — Other Ambulatory Visit (HOSPITAL_COMMUNITY): Payer: Self-pay

## 2023-06-03 ENCOUNTER — Other Ambulatory Visit: Payer: Self-pay | Admitting: Cardiology

## 2023-06-03 DIAGNOSIS — U071 COVID-19: Secondary | ICD-10-CM | POA: Diagnosis not present

## 2023-06-03 DIAGNOSIS — E871 Hypo-osmolality and hyponatremia: Secondary | ICD-10-CM | POA: Diagnosis not present

## 2023-06-17 DIAGNOSIS — Z23 Encounter for immunization: Secondary | ICD-10-CM | POA: Diagnosis not present

## 2023-06-18 ENCOUNTER — Encounter: Payer: Self-pay | Admitting: Cardiology

## 2023-06-18 ENCOUNTER — Ambulatory Visit: Payer: Medicare Other | Attending: Cardiology | Admitting: Cardiology

## 2023-06-18 VITALS — BP 92/64 | HR 73 | Ht 65.0 in | Wt 177.0 lb

## 2023-06-18 DIAGNOSIS — I10 Essential (primary) hypertension: Secondary | ICD-10-CM

## 2023-06-18 DIAGNOSIS — I2510 Atherosclerotic heart disease of native coronary artery without angina pectoris: Secondary | ICD-10-CM | POA: Diagnosis not present

## 2023-06-18 DIAGNOSIS — E785 Hyperlipidemia, unspecified: Secondary | ICD-10-CM

## 2023-06-18 DIAGNOSIS — E669 Obesity, unspecified: Secondary | ICD-10-CM | POA: Diagnosis not present

## 2023-06-18 DIAGNOSIS — I471 Supraventricular tachycardia, unspecified: Secondary | ICD-10-CM | POA: Diagnosis not present

## 2023-06-18 MED ORDER — METOPROLOL SUCCINATE ER 25 MG PO TB24: 25.0000 mg | ORAL_TABLET | Freq: Every day | ORAL | 3 refills | Status: DC

## 2023-06-18 NOTE — Progress Notes (Signed)
Cardiology Office Note:  .   Date:  06/27/2023  ID:  Cynthia [name], DOB 1951-01-14, MRN [id number] PCP: Camie Patience, FNP  [facility] Providers Cardiologist:  Bryan Lemma, MD     Chief Complaint  Patient presents with   Follow-up    Annual follow-up.  No real palpitations.  Recently started [facility] with 30 pound weight loss.    History of Present Illness: .     Cynthia [name] is a formerly morbidly obese 72 y.o. female  with a PMH notable for PSVT, Nonobstructive CAD (60% LAD proximal D1., HTN, HLD  who presents here for annual f/u at the request of Cynthia Mylar, MD.  Cynthia [name] was last seen on 06/10/2022 - > noted 1 quick spell that broke with vagal maneuvers.  Cardiology with heart rates up to the 120s.  No signs of chronotropic incompetence.  Mild deconditioning.  Weight goes up and down.  1 year follow-up  05/30/2023 - ER - COVID     Subjective   Cynthia [name] returns here today for annual follow-up but in the interim since I last seen her, she contacted [place] back in March about potentially using Wegovy for weight loss.   With the assistance of our [facility] clinical pharmacy team the prior authorization was submitted and she was cleared and started on Wegovy.  She was 207 pounds and I saw her last time and has now lost down to 177 pounds.  Today she presents here overall stating that besides having some GI issues of constipation and some nausea occasionally with taking her Wegovy, she is doing okay.  The constipation is usually taken care of by taking some Ex-Lax.  However she is happy with the weight loss.  She does feel a little bit weak and tired on occasion and her blood pressures have been running relatively low.  She is trying to stay active with walking and denies any chest pain or pressure with rest or exertion. No PND, orthopnea or edema. No real episodes of palpitations.  She has some short spells that she is able to usually break with vagal  maneuvers.  Nothing the last very long.  No syncope or near syncope.  Biggest question she asked today was at 1.2 we plateaued with the [facility] and what is the maintenance regimen now that she has lost a significant mount of weight.  ROS:  Review of Systems - Negative except elevated dizziness and fatigue but doing better.  Constipation with some GI upset from [facility].     Objective   Studies Reviewed: Marland Kitchen   EKG Interpretation Date/Time:  Wednesday June 18 2023 13:05:24 EDT Ventricular Rate:  73 PR Cynthia:  154 QRS Duration:  88 QT Cynthia:  370 QTC Calculation: 407 R Axis:   62  Text Interpretation: Normal sinus rhythm Normal ECG When compared with ECG of 24-May-2019 16:24, Nonspecific T wave abnormality no longer evident in Anterior leads Confirmed by Bryan Lemma ([id number]) on 06/18/2023 1:20:22 PM      Risk Assessment/Calculations:          Physical Exam:   VS:  BP 92/64   Pulse 73   Ht 5\' 5"  (1.651 m)   Wt 177 lb (80.3 kg)   SpO2 97%   BMI 29.45 kg/m    Wt Readings from Last 3 Encounters:  06/18/23 177 lb (80.3 kg)  05/30/23 207 lb 10.8 oz (94.2 kg)  06/10/22 207 lb 9.6 oz (94.2 kg)  GEN: Well nourished, well developed in no acute distress; notable wgt loss.  NECK: No JVD; No carotid bruits CARDIAC: Normal S1, S2; RRR, no murmurs, rubs, gallops RESPIRATORY:  Clear to auscultation without rales, wheezing or rhonchi ; nonlabored, good air movement. ABDOMEN: Soft, non-tender, non-distended EXTREMITIES:  No edema; No deformity      ASSESSMENT AND PLAN: .    Problem List Items Addressed This Visit       Cardiology Problems   Coronary artery disease, 60% LAD prior to D1 (Chronic)    Moderate single-vessel disease-nonobstructive with 60% stenosis of the LAD.  Thankfully, no active anginal symptoms.   She remains on aspirin and statin and Toprol.  However with low blood pressures Owen to reduce her Toprol to 25 mg. Now on Wegovy with notable weight  loss.      Relevant Medications   metoprolol succinate (TOPROL-XL) 25 MG 24 hr tablet   Other Relevant Orders   EKG 12-Lead (Completed)   AMB Referral to [facility] Pharm-D   Essential hypertension (Chronic)    With JYNWGN, she has lost weight and is now probably not requiring as much BP that is.  She is actually borderline hypotensive today but not having any syncope or near syncope.  Plan: Cut Toprol dose to 25 mg.  Make sure that she is maintaining adequate hydration especially with her having constipation issues reiterated that dehydration is very major concern for patient's on Wegovy.      Relevant Medications   metoprolol succinate (TOPROL-XL) 25 MG 24 hr tablet   Other Relevant Orders   EKG 12-Lead (Completed)   Hyperlipidemia with target LDL less than 70 (Chronic)    Remains on atorvastatin 40 mg daily.  Last labs from February had LDL of 58.  I would imagine with her significant weight loss if this were to be improved anymore.  A1c also in August was down to 4.9.  Stable renal function.  In the future we could potentially consider reducing dose.   Labs are usually followed by PCP.      Relevant Medications   metoprolol succinate (TOPROL-XL) 25 MG 24 hr tablet   SVT (supraventricular tachycardia) - Primary (Chronic)    As far she can tell, she has not had any breakthrough spells of SVT that have been anything more than minutes.  She is able to break them pretty quickly with vagal maneuvers when she for started feeling the symptoms in the true episode.  Continue beta-blocker, reducing dose.  Reiterated adequate hydration vagal maneuvers.      Relevant Medications   metoprolol succinate (TOPROL-XL) 25 MG 24 hr tablet   Other Relevant Orders   EKG 12-Lead (Completed)     Other   Obesity with body mass index 30 or greater (Chronic)    Obesity with CAD, started on Wegovy, now BMI down to 29.45.  Depression is what is the maintenance plan now that she is on a stable dose of  Wegovy.  Will refer back to [facility] to discuss maintenance management      Other Visit Diagnoses     Coronary artery disease involving native coronary artery of native heart without angina pectoris       Relevant Medications   metoprolol succinate (TOPROL-XL) 25 MG 24 hr tablet   Other Relevant Orders   AMB Referral to [facility] Pharm-D       I spent quite a bit of time talking to her about symptoms with the EEG cardiovascular benefits associated with that  but also the concerns she had about what we do going forward with management.  We will ask our clinical pharmacy team to give her a call and potentially have her come in to be seen to discuss the maintenance plan for GLP-1 agonist.        Dispo: Return in about 6 months (around 12/16/2023) for 6 month follow-up with me.  Total time spent: 29 min spent with patient + 14 min spent charting = 43 min     Signed, [name], MD, MS Bryan Lemma, M.D., M.S. Interventional Cardiologist  [facility]  Pager # [phone] Phone # [phone] [address] [place], [place] [address]

## 2023-06-18 NOTE — Patient Instructions (Addendum)
Medication Instructions:  Decrease Toprol  ( Metoprolol succinate) 25 mg daily   *If you need a refill on your cardiac medications before your next appointment, please call your pharmacy*   Lab Work:  Not needed   Testing/Procedures:  Not needed  Follow-Up: At [facility], you and your health needs are our priority.  As part of our continuing mission to provide you with exceptional heart care, we have created designated Provider Care Teams.  These Care Teams include your primary Cardiologist (physician) and Advanced Practice Providers (APPs -  Physician Assistants and Nurse Practitioners) who all work together to provide you with the care you need, when you need it.     Your next appointment:   6 month(s)  The format for your next appointment:   In Person  Provider:    12 month follow up Bryan Lemma, MD  [facility] - with Pharmacy -- discuss  the  use of [phone] ( how long to continue)

## 2023-06-24 ENCOUNTER — Telehealth: Payer: Self-pay

## 2023-06-24 NOTE — Telephone Encounter (Signed)
pt has an appt with pharmacy on 9/26 but received a vm that her questions about wegovy could be answered over the phone.

## 2023-06-24 NOTE — Telephone Encounter (Signed)
Returned call to patient.  She has questions about how to discontinue medication once she gets to goal weight.  Answered all questions.  Currently on the 2.4 mg Wegovy and has lost approx 33 lbs since April.    She will reach out to [place] when she is ready to start tapering back on the medication.

## 2023-06-26 ENCOUNTER — Ambulatory Visit: Payer: Medicare Other

## 2023-06-27 ENCOUNTER — Encounter: Payer: Self-pay | Admitting: Cardiology

## 2023-06-27 NOTE — Assessment & Plan Note (Signed)
Obesity with CAD, started on Wegovy, now BMI down to 29.45.  Depression is what is the maintenance plan now that she is on a stable dose of Wegovy.  Will refer back to [facility] to discuss maintenance management

## 2023-06-27 NOTE — Assessment & Plan Note (Signed)
As far she can tell, she has not had any breakthrough spells of SVT that have been anything more than minutes.  She is able to break them pretty quickly with vagal maneuvers when she for started feeling the symptoms in the true episode.  Continue beta-blocker, reducing dose.  Reiterated adequate hydration vagal maneuvers.

## 2023-06-27 NOTE — Assessment & Plan Note (Signed)
Moderate single-vessel disease-nonobstructive with 60% stenosis of the LAD.  Thankfully, no active anginal symptoms.   She remains on aspirin and statin and Toprol.  However with low blood pressures Owen to reduce her Toprol to 25 mg. Now on Wegovy with notable weight loss.

## 2023-06-27 NOTE — Assessment & Plan Note (Signed)
Remains on atorvastatin 40 mg daily.  Last labs from February had LDL of 58.  I would imagine with her significant weight loss if this were to be improved anymore.  A1c also in August was down to 4.9.  Stable renal function.  In the future we could potentially consider reducing dose.   Labs are usually followed by PCP.

## 2023-06-27 NOTE — Assessment & Plan Note (Signed)
With ZOXWRU, she has lost weight and is now probably not requiring as much BP that is.  She is actually borderline hypotensive today but not having any syncope or near syncope.  Plan: Cut Toprol dose to 25 mg.  Make sure that she is maintaining adequate hydration especially with her having constipation issues reiterated that dehydration is very major concern for patient's on Wegovy.

## 2023-06-28 ENCOUNTER — Other Ambulatory Visit (HOSPITAL_COMMUNITY): Payer: Self-pay

## 2023-06-30 ENCOUNTER — Other Ambulatory Visit (HOSPITAL_COMMUNITY): Payer: Self-pay

## 2023-07-02 ENCOUNTER — Other Ambulatory Visit: Payer: Self-pay

## 2023-07-02 ENCOUNTER — Other Ambulatory Visit: Payer: Self-pay | Admitting: Cardiology

## 2023-07-02 MED ORDER — ATORVASTATIN CALCIUM 40 MG PO TABS: 40.0000 mg | ORAL_TABLET | Freq: Every day | ORAL | 1 refills | Status: DC

## 2023-07-14 DIAGNOSIS — K080 Exfoliation of teeth due to systemic causes: Secondary | ICD-10-CM | POA: Diagnosis not present

## 2023-07-28 ENCOUNTER — Other Ambulatory Visit (HOSPITAL_COMMUNITY): Payer: Self-pay

## 2023-07-29 DIAGNOSIS — H43813 Vitreous degeneration, bilateral: Secondary | ICD-10-CM | POA: Diagnosis not present

## 2023-07-29 DIAGNOSIS — H26491 Other secondary cataract, right eye: Secondary | ICD-10-CM | POA: Diagnosis not present

## 2023-07-29 DIAGNOSIS — H04123 Dry eye syndrome of bilateral lacrimal glands: Secondary | ICD-10-CM | POA: Diagnosis not present

## 2023-08-05 DIAGNOSIS — K080 Exfoliation of teeth due to systemic causes: Secondary | ICD-10-CM | POA: Diagnosis not present

## 2023-08-19 ENCOUNTER — Other Ambulatory Visit (HOSPITAL_COMMUNITY): Payer: Self-pay

## 2023-08-21 ENCOUNTER — Other Ambulatory Visit: Payer: Self-pay

## 2023-09-02 ENCOUNTER — Encounter: Payer: Self-pay | Admitting: Cardiology

## 2023-09-03 ENCOUNTER — Telehealth: Payer: Self-pay | Admitting: Pharmacy Technician

## 2023-09-03 ENCOUNTER — Other Ambulatory Visit (HOSPITAL_COMMUNITY): Payer: Self-pay

## 2023-09-03 NOTE — Telephone Encounter (Signed)
Pharmacy Patient Advocate Encounter   Received notification from Patient Advice Request messages that prior authorization for wegovy is required/requested.   Insurance verification completed.   The patient is insured through [facility] .   Per test claim: Refill too soon. PA is not needed at this time. Medication was filled 08/21/23. Next eligible fill date is 09/11/23.  Prior auth on file until 11/2023 upon submitting PA-  PA not needed at this time

## 2023-09-19 ENCOUNTER — Other Ambulatory Visit (HOSPITAL_COMMUNITY): Payer: Self-pay

## 2023-10-20 ENCOUNTER — Other Ambulatory Visit: Payer: Self-pay | Admitting: Cardiology

## 2023-10-20 DIAGNOSIS — E669 Obesity, unspecified: Secondary | ICD-10-CM

## 2023-10-20 DIAGNOSIS — I2510 Atherosclerotic heart disease of native coronary artery without angina pectoris: Secondary | ICD-10-CM

## 2023-10-21 ENCOUNTER — Other Ambulatory Visit (HOSPITAL_COMMUNITY): Payer: Self-pay

## 2023-10-21 MED ORDER — WEGOVY 2.4 MG/0.75ML ~~LOC~~ SOAJ
2.4000 mg | SUBCUTANEOUS | 5 refills | Status: DC
  Filled 2023-10-21: qty 3, 28d supply, fill #0
  Filled 2023-11-13: qty 3, 28d supply, fill #1
  Filled 2023-12-11: qty 3, 28d supply, fill #2
  Filled 2024-01-08: qty 3, 28d supply, fill #3
  Filled 2024-02-05: qty 3, 28d supply, fill #4
  Filled 2024-03-04: qty 3, 28d supply, fill #5

## 2023-11-10 ENCOUNTER — Encounter: Payer: Self-pay | Admitting: Cardiology

## 2023-11-13 ENCOUNTER — Other Ambulatory Visit (HOSPITAL_COMMUNITY): Payer: Self-pay

## 2023-11-28 DIAGNOSIS — E7800 Pure hypercholesterolemia, unspecified: Secondary | ICD-10-CM | POA: Diagnosis not present

## 2023-11-28 DIAGNOSIS — E559 Vitamin D deficiency, unspecified: Secondary | ICD-10-CM | POA: Diagnosis not present

## 2023-11-28 DIAGNOSIS — I10 Essential (primary) hypertension: Secondary | ICD-10-CM | POA: Diagnosis not present

## 2023-11-28 DIAGNOSIS — Z23 Encounter for immunization: Secondary | ICD-10-CM | POA: Diagnosis not present

## 2023-11-28 DIAGNOSIS — Z0000 Encounter for general adult medical examination without abnormal findings: Secondary | ICD-10-CM | POA: Diagnosis not present

## 2023-12-11 ENCOUNTER — Other Ambulatory Visit: Payer: Self-pay

## 2023-12-22 ENCOUNTER — Ambulatory Visit: Payer: Medicare Other | Attending: Cardiology | Admitting: Cardiology

## 2023-12-22 VITALS — BP 116/60 | HR 70 | Ht 65.0 in | Wt 164.0 lb

## 2023-12-22 DIAGNOSIS — I471 Supraventricular tachycardia, unspecified: Secondary | ICD-10-CM | POA: Diagnosis not present

## 2023-12-22 DIAGNOSIS — I10 Essential (primary) hypertension: Secondary | ICD-10-CM

## 2023-12-22 DIAGNOSIS — E785 Hyperlipidemia, unspecified: Secondary | ICD-10-CM

## 2023-12-22 DIAGNOSIS — E669 Obesity, unspecified: Secondary | ICD-10-CM

## 2023-12-22 DIAGNOSIS — I2510 Atherosclerotic heart disease of native coronary artery without angina pectoris: Secondary | ICD-10-CM | POA: Diagnosis not present

## 2023-12-22 NOTE — Patient Instructions (Signed)
 Medication Instructions:   No changes *If you need a refill on your cardiac medications before your next appointment, please call your pharmacy*   Lab Work: Not needed    Testing/Procedures:  Not needed  Follow-Up: At [facility], you and your health needs are our priority.  As part of our continuing mission to provide you with exceptional heart care, we have created designated Provider Care Teams.  These Care Teams include your primary Cardiologist (physician) and Advanced Practice Providers (APPs -  Physician Assistants and Nurse Practitioners) who all work together to provide you with the care you need, when you need it.     Your next appointment:   12 month(s)  The format for your next appointment:   In Person  Provider:   Bryan Lemma, MD   Your physician recommends that you schedule a follow-up appointment in: 6 month   with [facility]- lipid clinic  for [facility]  Other Instructions  s

## 2023-12-22 NOTE — Progress Notes (Unsigned)
 Cardiology Office Note:  .   Date:  12/23/2023  ID:  Cynthia [name], DOB 1951-01-05, MRN [id number] PCP: Camie Patience, FNP  [facility] Providers Cardiologist:  Bryan Lemma, MD     No chief complaint on file.   Patient Profile: .     Cynthia [name] is a formerly morbidly obese 73 y.o. female with a PMH notable for PSVT, nonobstructive CAD (6% proximal D1), HTN, HLD who presents here for 68-month follow-up at the request of Camie Patience, FNP.    Cynthia [name] was last seen on August 27, 2023-mostly noting some GI issues with constipation and nausea somewhat related to [facility].  Maybe a little tired and weak off and on.No active cardiac symptoms.  No breakthrough spells of SVT usually able to break with vagal maneuvers.  Plan was to discuss maintenance plan of Wegovy-dose was reduced  Subjective  Cynthia [name] returns today overall still doing well.  She actually is happy with how she has been doing with the weight loss and how her glycemic and lipid control is improved.  Her renal function has remained stable.  She just wonders how long she will be on the current dosage of Wegovy.  She is very happy with Wegovy.  She is actually much asymptomatic from a cardiac standpoint with intermittent episodes of tachycardia which she is able to break very quickly with vagal maneuvers within the first minute or so.  Otherwise no prolonged arrhythmias.  No chest pain or pressure with rest exertion.  No PND, orthopnea, or edema.  No sick.  Neoscan, TIA or emesis fugax.  No claudication. She had noted that her weight loss has plateaued a little bit but is still herself working on dietary adjustment.  Objective   Current Meds  Medication Sig   ALPRAZolam (XANAX) 0.5 MG tablet Take 0.25 mg by mouth at bedtime as needed for anxiety.    aspirin EC 81 MG tablet Take 81 mg by mouth daily.   atorvastatin (LIPITOR) 40 MG tablet Take 1 tablet (40 mg total) by mouth daily.   buPROPion  (WELLBUTRIN XL) 300 MG 24 hr tablet Take 300 mg by mouth daily.   Calcium Carbonate-Vitamin D (CALTRATE 600+D PO) Take 1 tablet by mouth daily.   ergocalciferol (VITAMIN D2) 1.25 MG (50000 UT) capsule Vitamin D2 1,250 mcg (50,000 unit) capsule   metoprolol succinate (TOPROL-XL) 25 MG 24 hr tablet Take 1 tablet (25 mg total) by mouth daily.   Multiple Vitamins-Minerals (MULTIVITAMIN ADULT PO) Take 1 tablet by mouth daily.   nitrofurantoin, macrocrystal-monohydrate, (MACROBID) 100 MG capsule Take 100 mg by mouth 2 (two) times daily.   nitroGLYCERIN (NITROSTAT) 0.4 MG SL tablet Place 1 tablet (0.4 mg total) under the tongue every 5 (five) minutes x 3 doses as needed for chest pain.   nystatin-triamcinolone (MYCOLOG II) cream Apply 1 application  topically daily as needed for rash.   Semaglutide-Weight Management (WEGOVY) 2.4 MG/0.75ML SOAJ Inject 2.4 mg into the skin once a week.    Studies Reviewed: Marland Kitchen   EKG Interpretation Date/Time:  Monday December 22 2023 16:34:12 EDT Ventricular Rate:  70 PR Interval:  150 QRS Duration:  90 QT Interval:  396 QTC Calculation: 427 R Axis:   46  Text Interpretation: Normal sinus rhythm Normal ECG When compared with ECG of 18-Jun-2023 13:05, No significant change was found Confirmed by Bryan Lemma ([id number]) on 12/22/2023 5:17:48 PM    No recent studies  Labs from 11/28/2023: TC  145, TG 66, HDL 65, LDL 66.  Hgb 13.3.;  NA 136, K4.4, CL 101, CO2 31, BUN 18, Cr 0.  7 warm, Ca 9.7.  Total bili 1.1-borderline elevated.  AST/ALT/alk phos 24/32/64.  Risk Assessment/Calculations:            Physical Exam:   VS:  BP 116/60 (BP Location: Left Arm, Patient Position: Sitting)   Pulse 70   Ht 5\' 5"  (1.651 m)   Wt 164 lb (74.4 kg)   SpO2 99%   BMI 27.29 kg/m    Wt Readings from Last 3 Encounters:  12/22/23 164 lb (74.4 kg)  06/18/23 177 lb (80.3 kg)  05/30/23 207 lb 10.8 oz (94.2 kg)    GEN: Well nourished, well developed in no acute distress;  healthy-appearing.  Well-groomed. NECK: No JVD; No carotid bruits CARDIAC: Normal S1, S2; RRR, no murmurs, rubs, gallops RESPIRATORY:  Clear to auscultation without rales, wheezing or rhonchi ; nonlabored, good air movement. ABDOMEN: Soft, non-tender, non-distended EXTREMITIES:  No edema; No deformity     ASSESSMENT AND PLAN: .    Problem List Items Addressed This Visit       Cardiology Problems   Coronary artery disease, 60% LAD prior to D1 (Chronic)   Doing well with no active anginal symptoms.  Risk factors are well-controlled with excellent glycemic, blood pressure and lipid control.  Also with weight loss. -Continue aspirin 81 mg daily along with Lipitor 40 mg daily and Toprol XL 25 mg daily -Continue Wegovy at the 2.4 mg weekly dose for now.      Relevant Orders   EKG 12-Lead (Completed)   Essential hypertension (Chronic)   BP well-controlled on low-dose Toprol 25 mg daily -Continue current dose.      Relevant Orders   EKG 12-Lead (Completed)   Hyperlipidemia with target LDL less than 70 (Chronic)   Target LDL with known CAD is less than 70.  Most recent LDL was 66 on 40 mg atorvastatin along with the weight loss from [facility]. -Continue Lipitor 40 mg daily and reassess labs per PCP -For now continue [facility] for weight loss.      Relevant Orders   EKG 12-Lead (Completed)   SVT (supraventricular tachycardia) (HCC) - Primary (Chronic)   Brief physical spells that seem to be easily broken with maneuvers.  She is able to pick up on them very quickly and break them without too much difficulty. -Continue Toprol 25 mg daily -Continue to monitor for symptoms and use vagal maneuvers were necessary      Relevant Orders   EKG 12-Lead (Completed)     Other   Obesity with body mass index 30 or greater (Chronic)   Has now gotten below the obesity range with a BMI of 27.  Her goal BMI is at least 25.  Discussed with pharmacy team.  I will have her follow-up with them in 6 months  at which time I suspect that if she is stabilized out or reach her target, would back down to the next lower dose of Wegovy and monitor for 3 to 6 months for stability with maintenance dosing.  Would eventually then further titrate down and potentially off Wegovy depending on cravings and weight change.        Follow-Up: Return in about 6 months (around 06/23/2024) for Cholesterol F/u with CVRR, 1 Yr Follow-up, Post-cath Followup with me.      Signed, [name], MD, MS Bryan Lemma, M.D., M.S. Interventional Cardiologist  [facility]  Pager # [phone] Phone # [phone] [address] [place], [place] [address]

## 2023-12-23 ENCOUNTER — Encounter: Payer: Self-pay | Admitting: Cardiology

## 2023-12-23 NOTE — Assessment & Plan Note (Signed)
 BP well-controlled on low-dose Toprol 25 mg daily -Continue current dose.

## 2023-12-23 NOTE — Assessment & Plan Note (Signed)
 Target LDL with known CAD is less than 70.  Most recent LDL was 66 on 40 mg atorvastatin along with the weight loss from [facility]. -Continue Lipitor 40 mg daily and reassess labs per PCP -For now continue [facility] for weight loss.

## 2023-12-23 NOTE — Assessment & Plan Note (Signed)
 Doing well with no active anginal symptoms.  Risk factors are well-controlled with excellent glycemic, blood pressure and lipid control.  Also with weight loss. -Continue aspirin 81 mg daily along with Lipitor 40 mg daily and Toprol XL 25 mg daily -Continue Wegovy at the 2.4 mg weekly dose for now.

## 2023-12-23 NOTE — Assessment & Plan Note (Signed)
 Brief physical spells that seem to be easily broken with maneuvers.  She is able to pick up on them very quickly and break them without too much difficulty. -Continue Toprol 25 mg daily -Continue to monitor for symptoms and use vagal maneuvers were necessary

## 2023-12-23 NOTE — Assessment & Plan Note (Signed)
 Has now gotten below the obesity range with a BMI of 27.  Her goal BMI is at least 25.  Discussed with pharmacy team.  I will have her follow-up with them in 6 months at which time I suspect that if she is stabilized out or reach her target, would back down to the next lower dose of Wegovy and monitor for 3 to 6 months for stability with maintenance dosing.  Would eventually then further titrate down and potentially off Wegovy depending on cravings and weight change.

## 2024-01-04 ENCOUNTER — Encounter: Payer: Self-pay | Admitting: Cardiology

## 2024-01-04 ENCOUNTER — Other Ambulatory Visit: Payer: Self-pay | Admitting: Cardiology

## 2024-01-05 DIAGNOSIS — Z01419 Encounter for gynecological examination (general) (routine) without abnormal findings: Secondary | ICD-10-CM | POA: Diagnosis not present

## 2024-01-05 DIAGNOSIS — Z1231 Encounter for screening mammogram for malignant neoplasm of breast: Secondary | ICD-10-CM | POA: Diagnosis not present

## 2024-01-05 MED ORDER — ATORVASTATIN CALCIUM 40 MG PO TABS: 40.0000 mg | ORAL_TABLET | Freq: Every day | ORAL | 3 refills | Status: AC

## 2024-01-12 ENCOUNTER — Telehealth: Payer: Self-pay | Admitting: Cardiology

## 2024-01-12 ENCOUNTER — Telehealth: Payer: Self-pay | Admitting: Pharmacy Technician

## 2024-01-12 ENCOUNTER — Other Ambulatory Visit (HOSPITAL_COMMUNITY): Payer: Self-pay

## 2024-01-12 NOTE — Telephone Encounter (Signed)
 Pt c/o medication issue:  1. Name of Medication:   Semaglutide-Weight Management (WEGOVY) 2.4 MG/0.75ML SOAJ    2. How are you currently taking this medication (dosage and times per day)?    3. Are you having a reaction (difficulty breathing--STAT)? no  4. What is your medication issue? Prior Siegfried Dress is needed. Please advise

## 2024-01-12 NOTE — Telephone Encounter (Signed)
 Pharmacy Patient Advocate Encounter   Received notification from CoverMyMeds that prior authorization for wegovy 2.4mg  is required/requested.   Insurance verification completed.   The patient is insured through [facility] .   Per test claim: PA required; PA submitted to above mentioned insurance via CoverMyMeds Key/confirmation #/EOC [id number] Status is pending

## 2024-01-13 ENCOUNTER — Other Ambulatory Visit (HOSPITAL_COMMUNITY): Payer: Self-pay

## 2024-01-13 ENCOUNTER — Telehealth: Payer: Self-pay | Admitting: Cardiology

## 2024-01-13 NOTE — Telephone Encounter (Signed)
 Pt c/o medication issue:  1. Name of Medication: Wagovy Injection  2. How are you currently taking this medication (dosage and times per day)?   3. Are you having a reaction (difficulty breathing--STAT)?   4. What is your medication issue? She said the patient needs a new prior authorization for this year. She said patient needs this by Thursday. She said you might have to expedite it please

## 2024-01-13 NOTE — Telephone Encounter (Signed)
 Received notification from [facility] that Prior Authorization for wegovy has been APPROVED from 01/12/24 to 01/11/25. Spoke to pharmacy to process.Copay is $0.00.   I called the patient to make her aware

## 2024-01-13 NOTE — Telephone Encounter (Signed)
 Pharmacy Patient Advocate Encounter  Received notification from [facility] that Prior Authorization for wegovy has been APPROVED from 01/12/24 to 01/11/25. Spoke to pharmacy to process.Copay is $0.00.    PA #/Case ID/Reference #: [id number]

## 2024-02-09 DIAGNOSIS — K080 Exfoliation of teeth due to systemic causes: Secondary | ICD-10-CM | POA: Diagnosis not present

## 2024-03-01 ENCOUNTER — Other Ambulatory Visit (HOSPITAL_BASED_OUTPATIENT_CLINIC_OR_DEPARTMENT_OTHER): Payer: Self-pay | Admitting: Family Medicine

## 2024-03-01 DIAGNOSIS — E2839 Other primary ovarian failure: Secondary | ICD-10-CM

## 2024-03-04 ENCOUNTER — Ambulatory Visit: Attending: Cardiology | Admitting: Pharmacist

## 2024-03-04 ENCOUNTER — Encounter: Payer: Self-pay | Admitting: Pharmacist

## 2024-03-04 VITALS — Ht 65.0 in | Wt 164.4 lb

## 2024-03-04 DIAGNOSIS — K5901 Slow transit constipation: Secondary | ICD-10-CM | POA: Insufficient documentation

## 2024-03-04 DIAGNOSIS — E785 Hyperlipidemia, unspecified: Secondary | ICD-10-CM | POA: Diagnosis not present

## 2024-03-04 DIAGNOSIS — R739 Hyperglycemia, unspecified: Secondary | ICD-10-CM | POA: Insufficient documentation

## 2024-03-04 DIAGNOSIS — E669 Obesity, unspecified: Secondary | ICD-10-CM | POA: Diagnosis not present

## 2024-03-04 DIAGNOSIS — F324 Major depressive disorder, single episode, in partial remission: Secondary | ICD-10-CM | POA: Insufficient documentation

## 2024-03-04 DIAGNOSIS — E559 Vitamin D deficiency, unspecified: Secondary | ICD-10-CM | POA: Insufficient documentation

## 2024-03-04 DIAGNOSIS — E7800 Pure hypercholesterolemia, unspecified: Secondary | ICD-10-CM | POA: Insufficient documentation

## 2024-03-04 DIAGNOSIS — Z860100 Personal history of colon polyps, unspecified: Secondary | ICD-10-CM | POA: Insufficient documentation

## 2024-03-04 DIAGNOSIS — G4700 Insomnia, unspecified: Secondary | ICD-10-CM | POA: Insufficient documentation

## 2024-03-04 DIAGNOSIS — I2510 Atherosclerotic heart disease of native coronary artery without angina pectoris: Secondary | ICD-10-CM

## 2024-03-04 DIAGNOSIS — M810 Age-related osteoporosis without current pathological fracture: Secondary | ICD-10-CM | POA: Insufficient documentation

## 2024-03-04 NOTE — Progress Notes (Signed)
 Patient ID: Cynthia [name]                 DOB: 06-Dec-1950                    MRN: [id number]     HPI: Cynthia [name] is a 73 y.o. female patient referred to PharmD clinic by Dr Addie Holstein. PMH is significant for CAD, HTN, HLD and obesity.  Patient currently on metoprolol  succinate for HTN/CAD, atorvastatin  for HLD/CAD and Wegovy  for obesity.  Patient has been on Wegovy  since April 2024. Patients last weight pre-Wegovy  was 207#. Currently 164# which is a 21% decrease in body weight. Patient tolerating well other than occasional constipation. Concerned about long term side effects   Past Medical History:  Diagnosis Date   Anemia    Anxiety disorder    Aortic valve sclerosis 05/24/2019   Moderate noted on ECHO   Arthritis    right knee   Cancer of the skin, basal cell    Coronary artery disease    Moderate to severe, non-critical single-vessel coronary artery disease with 60-70% proximal LAD stenosis at origin of large diagonal branch ([place] 1,0,0).   Demand ischemia (HCC) 05/24/2019   Depression    GERD (gastroesophageal reflux disease)    TUMS   Hypertension    Lateral meniscus derangement, right    Raynaud's phenomenon    questionable because hands get white when cold   SVT (supraventricular tachycardia) (HCC)     Current Outpatient Medications on File Prior to Visit  Medication Sig Dispense Refill   ALPRAZolam  (XANAX ) 0.5 MG tablet Take 0.25 mg by mouth at bedtime as needed for anxiety.      aspirin  EC 81 MG tablet Take 81 mg by mouth daily.     atorvastatin  (LIPITOR) 40 MG tablet Take 1 tablet (40 mg total) by mouth daily. 90 tablet 3   buPROPion  (WELLBUTRIN  XL) 300 MG 24 hr tablet Take 300 mg by mouth daily.     Calcium  Carbonate-Vitamin D (CALTRATE 600+D PO) Take 1 tablet by mouth daily.     clobetasol (TEMOVATE) 0.05 % external solution Apply 1 Application topically.     ergocalciferol (VITAMIN D2) 1.25 MG (50000 UT) capsule Vitamin D2 1,250 mcg (50,000 unit) capsule      ferrous sulfate 325 (65 FE) MG tablet Take 325 mg by mouth daily with breakfast. (Patient not taking: Reported on 12/22/2023)     ketoconazole (NIZORAL) 2 % shampoo Apply 1 Application topically 2 (two) times a week.     metoprolol  succinate (TOPROL -XL) 25 MG 24 hr tablet Take 1 tablet (25 mg total) by mouth daily. 90 tablet 3   Multiple Vitamins-Minerals (MULTIVITAMIN ADULT PO) Take 1 tablet by mouth daily.     nitrofurantoin , macrocrystal-monohydrate, (MACROBID ) 100 MG capsule Take 100 mg by mouth 2 (two) times daily.     nitroGLYCERIN  (NITROSTAT ) 0.4 MG SL tablet Place 1 tablet (0.4 mg total) under the tongue every 5 (five) minutes x 3 doses as needed for chest pain. 25 tablet 3   nystatin-triamcinolone (MYCOLOG II) cream Apply 1 application  topically daily as needed for rash.     Semaglutide -Weight Management (WEGOVY ) 2.4 MG/0.75ML SOAJ Inject 2.4 mg into the skin once a week. 3 mL 5   tretinoin (RETIN-A) 0.025 % cream Apply a pea size AMOUNT TO face nightly.]     No current facility-administered medications on file prior to visit.    Allergies  Allergen Reactions   Penicillins  Rash    Pt does not remember, states she was real young    Assessment/Plan:  1. Obesity/CAD - Patient has lost >30# on Wegovy  and feels well. Discussed mechanism of action of semaglutide  in depth. Explained semaglutides role in controlling insulin and glucago secretion as well as delaying gastric emptying. Showed patient recently published studies highlighting semaglutides reduction of adverse cardiac events in patients without diabetes. Printed handouts. Advised if she would prefer to discontinue, to contact us  and we can taper dose. Patient will remain on medication at this time.  Chris Jax Abdelrahman, PharmD, BCACP, CDCES, CPP [facility] [address] [place], [place] [address] Phone: [phone]; Fax: [phone] 03/04/2024 12:48 PM   Reviewed options for lowering LDL cholesterol, including  statins, ezetimibe, PCSK9 inhibitors, Nexletol/Nexlizet, and Leqvio. Discussed efficacy, dosing, side effects, and copay information.

## 2024-03-04 NOTE — Patient Instructions (Signed)
 Aaron Aas

## 2024-03-23 ENCOUNTER — Ambulatory Visit (HOSPITAL_BASED_OUTPATIENT_CLINIC_OR_DEPARTMENT_OTHER)

## 2024-04-09 ENCOUNTER — Other Ambulatory Visit (HOSPITAL_COMMUNITY): Payer: Self-pay

## 2024-04-09 ENCOUNTER — Other Ambulatory Visit: Payer: Self-pay | Admitting: Cardiology

## 2024-04-09 DIAGNOSIS — E669 Obesity, unspecified: Secondary | ICD-10-CM

## 2024-04-09 DIAGNOSIS — I2510 Atherosclerotic heart disease of native coronary artery without angina pectoris: Secondary | ICD-10-CM

## 2024-04-09 MED ORDER — WEGOVY 2.4 MG/0.75ML ~~LOC~~ SOAJ
2.4000 mg | SUBCUTANEOUS | 5 refills | Status: DC
  Filled 2024-04-09: qty 3, 28d supply, fill #0
  Filled 2024-05-02: qty 3, 28d supply, fill #1
  Filled 2024-06-03: qty 3, 28d supply, fill #2
  Filled 2024-06-25: qty 3, 28d supply, fill #3
  Filled 2024-07-22: qty 3, 28d supply, fill #4
  Filled 2024-08-23: qty 3, 28d supply, fill #5

## 2024-04-13 ENCOUNTER — Other Ambulatory Visit: Payer: Self-pay | Admitting: Obstetrics & Gynecology

## 2024-04-13 DIAGNOSIS — N63 Unspecified lump in unspecified breast: Secondary | ICD-10-CM

## 2024-04-14 ENCOUNTER — Inpatient Hospital Stay
Admission: RE | Admit: 2024-04-14 | Discharge: 2024-04-14 | Discharge status: Home / Self Care | Source: Ambulatory Visit | Attending: Obstetrics & Gynecology | Admitting: Obstetrics & Gynecology

## 2024-04-14 ENCOUNTER — Ambulatory Visit
Admission: RE | Admit: 2024-04-14 | Discharge: 2024-04-14 | Disposition: A | Discharge status: Home / Self Care | Source: Ambulatory Visit | Attending: Obstetrics & Gynecology | Admitting: Obstetrics & Gynecology

## 2024-04-14 DIAGNOSIS — N644 Mastodynia: Secondary | ICD-10-CM | POA: Diagnosis not present

## 2024-04-14 DIAGNOSIS — N63 Unspecified lump in unspecified breast: Secondary | ICD-10-CM

## 2024-04-19 DIAGNOSIS — Z85828 Personal history of other malignant neoplasm of skin: Secondary | ICD-10-CM | POA: Diagnosis not present

## 2024-04-19 DIAGNOSIS — L814 Other melanin hyperpigmentation: Secondary | ICD-10-CM | POA: Diagnosis not present

## 2024-04-19 DIAGNOSIS — D225 Melanocytic nevi of trunk: Secondary | ICD-10-CM | POA: Diagnosis not present

## 2024-04-19 DIAGNOSIS — L821 Other seborrheic keratosis: Secondary | ICD-10-CM | POA: Diagnosis not present

## 2024-04-20 DIAGNOSIS — N6489 Other specified disorders of breast: Secondary | ICD-10-CM | POA: Diagnosis not present

## 2024-05-17 DIAGNOSIS — K080 Exfoliation of teeth due to systemic causes: Secondary | ICD-10-CM | POA: Diagnosis not present

## 2024-05-24 ENCOUNTER — Ambulatory Visit (HOSPITAL_BASED_OUTPATIENT_CLINIC_OR_DEPARTMENT_OTHER)
Admission: RE | Admit: 2024-05-24 | Discharge: 2024-05-24 | Disposition: A | Discharge status: Home / Self Care | Source: Ambulatory Visit | Attending: Family Medicine | Admitting: Family Medicine

## 2024-05-24 DIAGNOSIS — M810 Age-related osteoporosis without current pathological fracture: Secondary | ICD-10-CM | POA: Diagnosis not present

## 2024-05-24 DIAGNOSIS — E2839 Other primary ovarian failure: Secondary | ICD-10-CM | POA: Diagnosis not present

## 2024-05-26 DIAGNOSIS — E7800 Pure hypercholesterolemia, unspecified: Secondary | ICD-10-CM | POA: Diagnosis not present

## 2024-05-26 DIAGNOSIS — I10 Essential (primary) hypertension: Secondary | ICD-10-CM | POA: Diagnosis not present

## 2024-05-26 DIAGNOSIS — M810 Age-related osteoporosis without current pathological fracture: Secondary | ICD-10-CM | POA: Diagnosis not present

## 2024-05-26 DIAGNOSIS — F324 Major depressive disorder, single episode, in partial remission: Secondary | ICD-10-CM | POA: Diagnosis not present

## 2024-05-26 DIAGNOSIS — D649 Anemia, unspecified: Secondary | ICD-10-CM | POA: Diagnosis not present

## 2024-05-26 DIAGNOSIS — E559 Vitamin D deficiency, unspecified: Secondary | ICD-10-CM | POA: Diagnosis not present

## 2024-06-01 ENCOUNTER — Encounter: Payer: Self-pay | Admitting: Family Medicine

## 2024-06-01 ENCOUNTER — Ambulatory Visit (INDEPENDENT_AMBULATORY_CARE_PROVIDER_SITE_OTHER): Admitting: Family Medicine

## 2024-06-01 VITALS — BP 138/75 | Ht 65.0 in | Wt 160.0 lb

## 2024-06-01 DIAGNOSIS — E559 Vitamin D deficiency, unspecified: Secondary | ICD-10-CM

## 2024-06-01 DIAGNOSIS — M810 Age-related osteoporosis without current pathological fracture: Secondary | ICD-10-CM | POA: Diagnosis not present

## 2024-06-01 NOTE — Progress Notes (Unsigned)
 DATE OF VISIT: 06/01/2024        Cynthia [name] DOB: 01-Jul-1951 MRN: [id number]  CC: Osteoporosis evaluation  History- Cynthia [name] is a 73 y.o. female for evaluation and treatment of osteoporosis Referred by PCP Clemencia Hint, FNP after recent office visit - Office visit notes from 05/26/2024 reviewed in detail during visit today  DEXA scan showing decreased bone density on 05/24/2024 the T-score -2.8, last DEXA 05/21/2022 with T-score -2.4.  Worsening bone density despite prior treatment with Fosamax  Had recent labs with PCP 05/26/2024 showing: - Calcium  9.4 - Vitamin D 30.3 - Normal renal function - Normal CBC - Has not had recent TSH, PTH, SPEP  Prior treatment:  - Fosamax for several years, has been off for approximately 2 to 3 years -Also taking calcium  and vitamin D History of Hip, Spine, or Wrist Fracture: None Heart disease or stroke: No Cancer: No Kidney Disease: No Gastric/Peptic Ulcer: No Gastric bypass surgery: No Severe GERD: No History of seizures: No Age at Menopause: 49s Hysterectomy: no Calcium  intake: 600 mg daily Vitamin D intake: 50,000 international units weekly plus 400 international units daily Hormone replacement therapy: No Smoking history: Never Alcohol: None Exercise: 6 days a week going to the gym doing stretching, treadmill, elliptical, weight lifting with machines Major dental work in past year: Yes-she notes that dentist wanted to do an implant, but decided to do a bridge due to poor bone quality in her jaw.  She has an appointment with the dentist on Thursday of this week Parents with hip/spine fracture: Yes, mom with history of osteoporosis, dad also with history of fractures    Past Medical History Past Medical History:  Diagnosis Date   Anemia    Anxiety disorder    Aortic valve sclerosis 05/24/2019   Moderate noted on ECHO   Arthritis    right knee   Cancer of the skin, basal cell    Coronary artery disease    Moderate to severe,  non-critical single-vessel coronary artery disease with 60-70% proximal LAD stenosis at origin of large diagonal branch ([place] 1,0,0).   Demand ischemia (HCC) 05/24/2019   Depression    GERD (gastroesophageal reflux disease)    TUMS   Hypertension    Lateral meniscus derangement, right    Raynaud's phenomenon    questionable because hands get white when cold   SVT (supraventricular tachycardia) [facility])     Past Surgical History Past Surgical History:  Procedure Laterality Date   CARDIOVERSION  05/24/2019   Adenosine    CATARACT EXTRACTION, BILATERAL     CESAREAN SECTION     x3   CHONDROPLASTY Right 02/21/2017   Procedure: CHONDROPLASTY;  Surgeon: Sheril Coy, MD;  Location: [facility];  Service: Orthopedics;  Laterality: Right;   COLONOSCOPY     KNEE ARTHROSCOPY WITH LATERAL MENISECTOMY Right 02/21/2017   Procedure: KNEE ARTHROSCOPY WITH LATERAL MENISECTOMY;  Surgeon: Sheril Coy, MD;  Location: [facility];  Service: Orthopedics;  Laterality: Right;   KNEE ARTHROSCOPY WITH MEDIAL MENISECTOMY Right 02/21/2017   Procedure: KNEE ARTHROSCOPY WITH MEDIAL MENISECTOMY;  Surgeon: Sheril Coy, MD;  Location: [facility];  Service: Orthopedics;  Laterality: Right;   LEFT HEART CATH AND CORONARY ANGIOGRAPHY N/A 05/24/2019   Procedure: LEFT HEART CATH AND CORONARY ANGIOGRAPHY;  Surgeon: Mady Bruckner, MD;  Location: [facility];  Service: Cardiovascular;  Laterality: N/A;   MOUTH SURGERY     TOTAL KNEE ARTHROPLASTY Right 07/05/2019   Procedure:  RIGHT TOTAL KNEE ARTHROPLASTY;  Surgeon: Liam Lerner, MD;  Location: [facility];  Service: Orthopedics;  Laterality: Right;   TOTAL KNEE ARTHROPLASTY Left 10/20/2019   Procedure: LEFT TOTAL KNEE ARTHROPLASTY;  Surgeon: Liam Lerner, MD;  Location: [facility];  Service: Orthopedics;  Laterality: Left;   Yag Capsulotomy Left 06/30/2019    Medications Current Outpatient Medications  Medication Sig  Dispense Refill   ALPRAZolam  (XANAX ) 0.5 MG tablet Take 0.25 mg by mouth at bedtime as needed for anxiety.      aspirin  EC 81 MG tablet Take 81 mg by mouth daily.     atorvastatin  (LIPITOR) 40 MG tablet Take 1 tablet (40 mg total) by mouth daily. 90 tablet 3   buPROPion  (WELLBUTRIN  XL) 300 MG 24 hr tablet Take 300 mg by mouth daily.     Calcium  Carbonate-Vitamin D (CALTRATE 600+D PO) Take 1 tablet by mouth daily.     clobetasol (TEMOVATE) 0.05 % external solution Apply 1 Application topically.     ergocalciferol (VITAMIN D2) 1.25 MG (50000 UT) capsule Vitamin D2 1,250 mcg (50,000 unit) capsule     ferrous sulfate 325 (65 FE) MG tablet Take 325 mg by mouth daily with breakfast. (Patient not taking: Reported on 12/22/2023)     ketoconazole (NIZORAL) 2 % shampoo Apply 1 Application topically 2 (two) times a week.     metoprolol  succinate (TOPROL -XL) 25 MG 24 hr tablet Take 1 tablet (25 mg total) by mouth daily. 90 tablet 3   Multiple Vitamins-Minerals (MULTIVITAMIN ADULT PO) Take 1 tablet by mouth daily.     nitrofurantoin , macrocrystal-monohydrate, (MACROBID ) 100 MG capsule Take 100 mg by mouth 2 (two) times daily.     nitroGLYCERIN  (NITROSTAT ) 0.4 MG SL tablet Place 1 tablet (0.4 mg total) under the tongue every 5 (five) minutes x 3 doses as needed for chest pain. 25 tablet 3   nystatin-triamcinolone (MYCOLOG II) cream Apply 1 application  topically daily as needed for rash.     Semaglutide -Weight Management (WEGOVY ) 2.4 MG/0.75ML SOAJ Inject 2.4 mg into the skin once a week. 3 mL 5   tretinoin (RETIN-A) 0.025 % cream Apply a pea size AMOUNT TO face nightly.]     No current facility-administered medications for this visit.    Allergies is allergic to penicillins.  Family History - reviewed per EMR and intake form  Social History   reports that she does not currently use alcohol.  reports that she has never smoked. She has never used smokeless tobacco.  reports no history of drug  use.   EXAM: Vitals: BP 138/75   Ht 5' 5 (1.651 m)   Wt 160 lb (72.6 kg)   BMI 26.63 kg/m  General: AOx3, NAD, pleasant MSK: Sitting comfortably in exam room chair.  Normal gross upper extremity lower extremity strength Psych: Appropriate mood, answering questions appropriately, engaging, good insight  IMAGING: DEXA scan 05/24/2024 showing: - T-score -2.8  DEXA scan 05/21/2022 showing: - T-score -2.4  LABS: recent labs with PCP 05/26/2024 showing: - Calcium  9.4 - Vitamin D 30.3 - Normal renal function - Normal CBC - Has not had recent TSH, PTH, SPEP  Assessment & Plan Age-related osteoporosis without current pathological fracture Osteoporosis without underlying history of insufficiency fracture, associated with vitamin D deficiency.  Worsening T-score on DEXA scan despite prior treatment with Fosamax as well as regular vitamin D and calcium , plus regular weightbearing exercise  Plan: - Visit notes with PCP from 05/26/2024 reviewed as noted above - Reviewed DEXA scan results  and lab results as noted above - Patient with declining bone density despite prior treatment with oral bisphosphonate.  Discussed potential treatment options including Prolia .  Patient does have history of recent dental work, is scheduled to see the dentist on Thursday.  Did review potential risk of jaw osteonecrosis.  Recommend she speak with dentist to see if they have any concerns about osteoporosis treatment.  Prolia  would be the best option for her based on her DEXA and no prior history of fracture - Did discuss possible anabolic therapies, but she does not meet criteria for severe osteoporosis at this time. - She should continue regular calcium  supplementation.  I recommend she increase her dosage to 1200 mg daily - She is taking adequate vitamin D, can continue with this with goal of at least 800 international units daily - Labs: Placed order for TSH, PTH, SPEP to rule out other potential secondary  causes of osteoporosis getting declining bone density despite prior osteoporosis therapy - Patient will reach out with recommendations from her dentist and we can determine best appropriate treatment at that time Vitamin D deficiency History of osteoporosis with underlying vitamin D deficiency - Last vitamin D level was low normal at recent PCP visit  Plan: - Continue vitamin D supplementation as she is doing  Patient expressed understanding & agreement with above.  Encounter Diagnoses  Name Primary?   Age-related osteoporosis without current pathological fracture Yes   Vitamin D deficiency     Orders Placed This Encounter  Procedures   TSH   PTH, intact (no Ca)   Protein Electrophoresis, (serum)    Orders Placed This Encounter  Procedures   TSH   PTH, intact (no Ca)   Protein Electrophoresis, (serum)

## 2024-06-01 NOTE — Patient Instructions (Signed)
 Osteoporosis Work-up and Treatment  Labwork to obtain: TSH, PTH, SPEP   Supplementation: Make sure you're getting 1200mg  calcium  and 800 IU of vitamin D daily Your Vitamin D intake it good at your current dosage You should take an additional tab of Calcium  daily for a total of 600mg  twice a day  Exercise: Strength/resistance-based exercise (i.e. free weights, lifting); weight-based (yoga, tai-chi, pilates) Your current regimen is EXCELLENT!  You should continue with this  Medication Options: Prolia  - injection that just maintains bone - given every 6 months here in the office  - this is the treatment I would recommend - Please discuss with your dentist, as there can be a potential risk of jaw issues, specifically a condition called osteonecrosis of the jaw - if your dentist is ok with treatment or has concerns, please send me a MyChart message Other options include:  Evenity - this builds bone, is an injection once a month for 12 months - after you finish this you would go on a medicine that keeps it built up (Prolia  or fosamax) - This medication can also cause jaw issues & is reserved for severe osteoporisis  Fosamax - pill you take once a week that maintains bone- need to take on empty stomach and sit upright for 60 minutes after taking - we may consider going back on this if there are no other options 4. Forteo & Tymlos - these are medications that also build bone similar to Minco and are reserved for severe osteoporosis.   - these do not have a risk of jaw issues - these are a daily injection that are taken for 2 years, then transitioned to a medication like Prolia  or Fosamax  Next steps: - Please check with your dentist if they are ok with you having treatment for osteoporosis with Prolia  or Evenity since they can cause jaw/tooth issues  Thanks!

## 2024-06-02 ENCOUNTER — Other Ambulatory Visit: Payer: Self-pay

## 2024-06-02 ENCOUNTER — Other Ambulatory Visit: Payer: Self-pay | Admitting: *Deleted

## 2024-06-02 DIAGNOSIS — E559 Vitamin D deficiency, unspecified: Secondary | ICD-10-CM | POA: Diagnosis not present

## 2024-06-02 DIAGNOSIS — M810 Age-related osteoporosis without current pathological fracture: Secondary | ICD-10-CM | POA: Diagnosis not present

## 2024-06-02 MED ORDER — DENOSUMAB 60 MG/ML ~~LOC~~ SOSY
60.0000 mg | PREFILLED_SYRINGE | Freq: Once | SUBCUTANEOUS | 0 refills | Status: AC
  Filled 2024-06-02: qty 1, 1d supply, fill #0
  Filled 2024-06-04: qty 1, 180d supply, fill #0

## 2024-06-02 NOTE — Assessment & Plan Note (Signed)
 Osteoporosis without underlying history of insufficiency fracture, associated with vitamin D deficiency.  Worsening T-score on DEXA scan despite prior treatment with Fosamax as well as regular vitamin D and calcium , plus regular weightbearing exercise  Plan: - Visit notes with PCP from 05/26/2024 reviewed as noted above - Reviewed DEXA scan results and lab results as noted above - Patient with declining bone density despite prior treatment with oral bisphosphonate.  Discussed potential treatment options including Prolia .  Patient does have history of recent dental work, is scheduled to see the dentist on Thursday.  Did review potential risk of jaw osteonecrosis.  Recommend she speak with dentist to see if they have any concerns about osteoporosis treatment.  Prolia  would be the best option for her based on her DEXA and no prior history of fracture - Did discuss possible anabolic therapies, but she does not meet criteria for severe osteoporosis at this time. - She should continue regular calcium  supplementation.  I recommend she increase her dosage to 1200 mg daily - She is taking adequate vitamin D, can continue with this with goal of at least 800 international units daily - Labs: Placed order for TSH, PTH, SPEP to rule out other potential secondary causes of osteoporosis getting declining bone density despite prior osteoporosis therapy - Patient will reach out with recommendations from her dentist and we can determine best appropriate treatment at that time

## 2024-06-02 NOTE — Assessment & Plan Note (Signed)
 History of osteoporosis with underlying vitamin D deficiency - Last vitamin D level was low normal at recent PCP visit  Plan: - Continue vitamin D supplementation as she is doing

## 2024-06-03 ENCOUNTER — Other Ambulatory Visit: Payer: Self-pay

## 2024-06-03 ENCOUNTER — Telehealth: Payer: Self-pay

## 2024-06-03 NOTE — Telephone Encounter (Signed)
 Pharmacy Patient Advocate Encounter   Received notification from Patient Pharmacy that prior authorization for Prolia  is required/requested.   Insurance verification completed.   The patient is insured through [employer] .   Per test claim: PA required; PA submitted to above mentioned insurance via Latent Key/confirmation #/EOC [id number] Status is pending

## 2024-06-03 NOTE — Progress Notes (Signed)
 Pharmacy Patient Advocate Encounter  Insurance verification completed.   The patient is insured through [employer] test claim for Prolia . PA required.   This test claim was processed through [facility]- copay amounts may vary at other pharmacies due to pharmacy/plan contracts, or as the patient moves through the different stages of their insurance plan.

## 2024-06-04 ENCOUNTER — Other Ambulatory Visit: Payer: Self-pay

## 2024-06-04 LAB — PROTEIN ELECTROPHORESIS, SERUM
A/G Ratio: 1.5 (ref 0.7–1.7)
Albumin ELP: 3.7 g/dL (ref 2.9–4.4)
Alpha 1: 0.3 g/dL (ref 0.0–0.4)
Alpha 2: 0.6 g/dL (ref 0.4–1.0)
Beta: 0.9 g/dL (ref 0.7–1.3)
Gamma Globulin: 0.8 g/dL (ref 0.4–1.8)
Globulin, Total: 2.5 g/dL (ref 2.2–3.9)
Total Protein: 6.2 g/dL (ref 6.0–8.5)

## 2024-06-04 LAB — TSH: TSH: 1.57 u[IU]/mL (ref 0.450–4.500)

## 2024-06-04 LAB — PARATHYROID HORMONE, INTACT (NO CA): PTH: 36 pg/mL (ref 15–65)

## 2024-06-04 NOTE — Telephone Encounter (Signed)
 Pharmacy Patient Advocate Encounter  Received notification from [facility] that Prior Authorization for Prolia  has been APPROVED from 06/04/24 to 06/04/25   PA #/Case ID/Reference #: [id number]

## 2024-06-04 NOTE — Progress Notes (Signed)
 PA approved. Ran test claim-copay is $0

## 2024-06-04 NOTE — Progress Notes (Signed)
 Specialty Pharmacy Initial Fill Coordination Note  Cynthia [name] is a 73 y.o. female contacted today regarding initial fill of specialty medication(s) Denosumab  (PROLIA )   Patient requested Courier to Provider Office   Delivery date: 06/08/24   [facility]   Medication will be filled on 9/8.   Patient is aware of $0 copayment.

## 2024-06-07 ENCOUNTER — Ambulatory Visit (INDEPENDENT_AMBULATORY_CARE_PROVIDER_SITE_OTHER): Admitting: Family Medicine

## 2024-06-07 ENCOUNTER — Ambulatory Visit: Payer: Self-pay | Admitting: Family Medicine

## 2024-06-07 ENCOUNTER — Other Ambulatory Visit: Payer: Self-pay

## 2024-06-07 DIAGNOSIS — M810 Age-related osteoporosis without current pathological fracture: Secondary | ICD-10-CM | POA: Diagnosis not present

## 2024-06-07 MED ORDER — DENOSUMAB 60 MG/ML ~~LOC~~ SOSY
60.0000 mg | PREFILLED_SYRINGE | Freq: Once | SUBCUTANEOUS | Status: AC
  Administered 2024-06-07: 60 mg via SUBCUTANEOUS

## 2024-06-07 NOTE — Progress Notes (Signed)
 Labs reviewed, all normal.  MyChart message sent

## 2024-06-07 NOTE — Progress Notes (Signed)
 Patient given Elias-Fela Solis prolia  injection 60mg /ml in the left arm. Pt supplied via our pharmacy. Patient tolerated injection well without reaction at the injection site. Patient will schedule next injection, which is 6 months from today. Gave patient lab orders to be drawn before next prolia  injection.

## 2024-06-25 ENCOUNTER — Other Ambulatory Visit (HOSPITAL_COMMUNITY): Payer: Self-pay

## 2024-06-25 ENCOUNTER — Other Ambulatory Visit: Payer: Self-pay | Admitting: Cardiology

## 2024-07-19 ENCOUNTER — Encounter: Payer: Self-pay | Admitting: Cardiology

## 2024-07-19 DIAGNOSIS — N950 Postmenopausal bleeding: Secondary | ICD-10-CM | POA: Diagnosis not present

## 2024-07-24 ENCOUNTER — Other Ambulatory Visit (HOSPITAL_COMMUNITY): Payer: Self-pay

## 2024-07-27 ENCOUNTER — Encounter: Payer: Self-pay | Admitting: Pharmacist

## 2024-08-02 DIAGNOSIS — D509 Iron deficiency anemia, unspecified: Secondary | ICD-10-CM | POA: Diagnosis not present

## 2024-08-04 DIAGNOSIS — H524 Presbyopia: Secondary | ICD-10-CM | POA: Diagnosis not present

## 2024-08-17 DIAGNOSIS — K080 Exfoliation of teeth due to systemic causes: Secondary | ICD-10-CM | POA: Diagnosis not present

## 2024-08-23 ENCOUNTER — Encounter (HOSPITAL_COMMUNITY): Payer: Self-pay

## 2024-08-23 NOTE — Progress Notes (Signed)
 Spoke w/ via phone for pre-op interview--- Cynthia [name] needs dos----  CBC, CBG and BMP per surgeon. EKG per anesthesia.       [name] results------ COVID test -----patient states asymptomatic no test needed Arrive at -------1030 NPO after MN NO Solid Food.  Clear liquids from MN until---0930 Pre-Surgery Ensure or G2:  Med rec completed Medications to take morning of surgery -----Metoprolol  and Wellbutrin . Diabetic medication -----  GLP1 agonist last dose: Wegovy  2.4mg  weekly. Last dose 08/16/24. GLP1 instructions: patient aware and verbalized understanding to hold any further doses until after surgery.  Patient instructed no nail polish to be worn day of surgery Patient instructed to bring photo id and insurance card day of surgery Patient aware to have Driver (ride ) / caregiver    for 24 hours after surgery - Husband Cynthia [name] Patient Special Instructions ----- Pre-Op special Instructions -----  Patient verbalized understanding of instructions that were given at this phone interview. Patient denies chest pain, sob, fever, cough at the interview.

## 2024-09-02 ENCOUNTER — Encounter (HOSPITAL_COMMUNITY): Payer: Self-pay

## 2024-09-02 ENCOUNTER — Other Ambulatory Visit: Payer: Self-pay

## 2024-09-02 ENCOUNTER — Ambulatory Visit (HOSPITAL_COMMUNITY): Admitting: Certified Registered Nurse Anesthetist

## 2024-09-02 ENCOUNTER — Ambulatory Visit (HOSPITAL_COMMUNITY): Admission: RE | Admit: 2024-09-02 | Discharge: 2024-09-02 | Disposition: A | Discharge status: Home / Self Care

## 2024-09-02 ENCOUNTER — Encounter (HOSPITAL_COMMUNITY): Admission: RE | Disposition: A | Discharge status: Home / Self Care | Payer: Self-pay | Source: Home / Self Care

## 2024-09-02 DIAGNOSIS — I2510 Atherosclerotic heart disease of native coronary artery without angina pectoris: Secondary | ICD-10-CM | POA: Diagnosis not present

## 2024-09-02 DIAGNOSIS — Z79899 Other long term (current) drug therapy: Secondary | ICD-10-CM | POA: Diagnosis not present

## 2024-09-02 DIAGNOSIS — Z7982 Long term (current) use of aspirin: Secondary | ICD-10-CM | POA: Diagnosis not present

## 2024-09-02 DIAGNOSIS — Z8249 Family history of ischemic heart disease and other diseases of the circulatory system: Secondary | ICD-10-CM | POA: Diagnosis not present

## 2024-09-02 DIAGNOSIS — M1990 Unspecified osteoarthritis, unspecified site: Secondary | ICD-10-CM | POA: Diagnosis not present

## 2024-09-02 DIAGNOSIS — M810 Age-related osteoporosis without current pathological fracture: Secondary | ICD-10-CM | POA: Diagnosis not present

## 2024-09-02 DIAGNOSIS — Z6826 Body mass index (BMI) 26.0-26.9, adult: Secondary | ICD-10-CM | POA: Diagnosis not present

## 2024-09-02 DIAGNOSIS — N950 Postmenopausal bleeding: Secondary | ICD-10-CM | POA: Diagnosis not present

## 2024-09-02 DIAGNOSIS — I10 Essential (primary) hypertension: Secondary | ICD-10-CM

## 2024-09-02 DIAGNOSIS — D649 Anemia, unspecified: Secondary | ICD-10-CM | POA: Diagnosis not present

## 2024-09-02 DIAGNOSIS — N840 Polyp of corpus uteri: Secondary | ICD-10-CM | POA: Diagnosis not present

## 2024-09-02 DIAGNOSIS — F418 Other specified anxiety disorders: Secondary | ICD-10-CM | POA: Diagnosis not present

## 2024-09-02 DIAGNOSIS — E669 Obesity, unspecified: Secondary | ICD-10-CM | POA: Diagnosis not present

## 2024-09-02 DIAGNOSIS — I471 Supraventricular tachycardia, unspecified: Secondary | ICD-10-CM | POA: Diagnosis not present

## 2024-09-02 DIAGNOSIS — K219 Gastro-esophageal reflux disease without esophagitis: Secondary | ICD-10-CM | POA: Diagnosis not present

## 2024-09-02 HISTORY — DX: Age-related osteoporosis without current pathological fracture: M81.0

## 2024-09-02 HISTORY — PX: DILATATION & CURRETTAGE/HYSTEROSCOPY WITH RESECTOCOPE: SHX5572

## 2024-09-02 LAB — CBC
HCT: 38.9 % (ref 36.0–46.0)
Hemoglobin: 12.9 g/dL (ref 12.0–15.0)
MCH: 29.1 pg (ref 26.0–34.0)
MCHC: 33.2 g/dL (ref 30.0–36.0)
MCV: 87.8 fL (ref 80.0–100.0)
Platelets: 215 K/uL (ref 150–400)
RBC: 4.43 MIL/uL (ref 3.87–5.11)
RDW: 13.1 % (ref 11.5–15.5)
WBC: 6.1 K/uL (ref 4.0–10.5)
nRBC: 0 % (ref 0.0–0.2)

## 2024-09-02 LAB — BASIC METABOLIC PANEL WITH GFR
Anion gap: 10 (ref 5–15)
BUN: 12 mg/dL (ref 8–23)
CO2: 23 mmol/L (ref 22–32)
Calcium: 8.8 mg/dL — ABNORMAL LOW (ref 8.9–10.3)
Chloride: 103 mmol/L (ref 98–111)
Creatinine, Ser: 0.65 mg/dL (ref 0.44–1.00)
GFR, Estimated: >60 mL/min (ref >60)
Glucose, Bld: 86 mg/dL (ref 70–99)
Potassium: 4 mmol/L (ref 3.5–5.1)
Sodium: 136 mmol/L (ref 135–145)

## 2024-09-02 LAB — GLUCOSE, CAPILLARY: Glucose-Capillary: 79 mg/dL (ref 70–99)

## 2024-09-02 SURGERY — DILATATION & CURETTAGE/HYSTEROSCOPY WITH RESECTOCOPE
Anesthesia: General | Site: Vagina

## 2024-09-02 MED ORDER — PROPOFOL 10 MG/ML IV BOLUS
INTRAVENOUS | Status: DC | PRN
  Administered 2024-09-02: 120 mg via INTRAVENOUS
  Administered 2024-09-02: 30 mg via INTRAVENOUS

## 2024-09-02 MED ORDER — ONDANSETRON HCL 4 MG/2ML IJ SOLN
INTRAMUSCULAR | Status: DC | PRN
  Administered 2024-09-02: 4 mg via INTRAVENOUS

## 2024-09-02 MED ORDER — PHENYLEPHRINE 80 MCG/ML (10ML) SYRINGE FOR IV PUSH (FOR BLOOD PRESSURE SUPPORT)
PREFILLED_SYRINGE | INTRAVENOUS | Status: AC
  Filled 2024-09-02: qty 10

## 2024-09-02 MED ORDER — FENTANYL CITRATE (PF) 250 MCG/5ML IJ SOLN
INTRAMUSCULAR | Status: DC | PRN
  Administered 2024-09-02 (×2): 25 ug via INTRAVENOUS

## 2024-09-02 MED ORDER — LABETALOL HCL 5 MG/ML IV SOLN
INTRAVENOUS | Status: AC
  Filled 2024-09-02: qty 4

## 2024-09-02 MED ORDER — ACETAMINOPHEN 10 MG/ML IV SOLN
INTRAVENOUS | Status: AC
  Filled 2024-09-02: qty 100

## 2024-09-02 MED ORDER — LACTATED RINGERS IV SOLN: INTRAVENOUS | Status: DC

## 2024-09-02 MED ORDER — PROPOFOL 10 MG/ML IV BOLUS
INTRAVENOUS | Status: AC
  Filled 2024-09-02: qty 20

## 2024-09-02 MED ORDER — OXYCODONE HCL 5 MG PO TABS: 5.0000 mg | ORAL_TABLET | Freq: Once | ORAL | Status: DC | PRN

## 2024-09-02 MED ORDER — SODIUM CHLORIDE 0.9 % IR SOLN
Status: DC | PRN
  Administered 2024-09-02 (×2): 3000 mL

## 2024-09-02 MED ORDER — LIDOCAINE 2% (20 MG/ML) 5 ML SYRINGE
INTRAMUSCULAR | Status: AC
  Filled 2024-09-02: qty 5

## 2024-09-02 MED ORDER — LIDOCAINE HCL (PF) 1 % IJ SOLN
INTRAMUSCULAR | Status: DC | PRN
  Administered 2024-09-02: 10 mL

## 2024-09-02 MED ORDER — ORAL CARE MOUTH RINSE: 15.0000 mL | Freq: Once | OROMUCOSAL | Status: AC

## 2024-09-02 MED ORDER — ONDANSETRON HCL 4 MG/2ML IJ SOLN
INTRAMUSCULAR | Status: AC
  Filled 2024-09-02: qty 2

## 2024-09-02 MED ORDER — ROCURONIUM BROMIDE 10 MG/ML (PF) SYRINGE
PREFILLED_SYRINGE | INTRAVENOUS | Status: AC
  Filled 2024-09-02: qty 10

## 2024-09-02 MED ORDER — FENTANYL CITRATE (PF) 100 MCG/2ML IJ SOLN: 25.0000 ug | INTRAMUSCULAR | Status: DC | PRN

## 2024-09-02 MED ORDER — LABETALOL HCL 5 MG/ML IV SOLN
10.0000 mg | Freq: Once | INTRAVENOUS | Status: AC
  Administered 2024-09-02: 10 mg via INTRAVENOUS

## 2024-09-02 MED ORDER — CHLORHEXIDINE GLUCONATE 0.12 % MT SOLN
OROMUCOSAL | Status: DC
  Filled 2024-09-02: qty 15

## 2024-09-02 MED ORDER — DEXAMETHASONE SOD PHOSPHATE PF 10 MG/ML IJ SOLN
INTRAMUSCULAR | Status: DC | PRN
  Administered 2024-09-02: 5 mg via INTRAVENOUS

## 2024-09-02 MED ORDER — LIDOCAINE 2% (20 MG/ML) 5 ML SYRINGE
INTRAMUSCULAR | Status: DC | PRN
  Administered 2024-09-02: 60 mg via INTRAVENOUS

## 2024-09-02 MED ORDER — DROPERIDOL 2.5 MG/ML IJ SOLN: 0.6250 mg | Freq: Once | INTRAMUSCULAR | Status: DC | PRN

## 2024-09-02 MED ORDER — ACETAMINOPHEN 10 MG/ML IV SOLN
INTRAVENOUS | Status: DC | PRN
  Administered 2024-09-02: 1000 mg via INTRAVENOUS

## 2024-09-02 MED ORDER — LIDOCAINE HCL (PF) 1 % IJ SOLN
INTRAMUSCULAR | Status: AC
  Filled 2024-09-02: qty 30

## 2024-09-02 MED ORDER — OXYCODONE HCL 5 MG/5ML PO SOLN: 5.0000 mg | Freq: Once | ORAL | Status: DC | PRN

## 2024-09-02 MED ORDER — CHLORHEXIDINE GLUCONATE 0.12 % MT SOLN
15.0000 mL | Freq: Once | OROMUCOSAL | Status: AC
  Administered 2024-09-02: 15 mL via OROMUCOSAL

## 2024-09-02 MED ORDER — FENTANYL CITRATE (PF) 250 MCG/5ML IJ SOLN
INTRAMUSCULAR | Status: AC
  Filled 2024-09-02: qty 5

## 2024-09-02 SURGICAL SUPPLY — 15 items
CATH ROBINSON RED A/P 16FR (CATHETERS) ×3 IMPLANT
DEVICE MYOSURE LITE (MISCELLANEOUS) IMPLANT
DEVICE MYOSURE REACH (MISCELLANEOUS) IMPLANT
GAUZE 4X4 16PLY ~~LOC~~+RFID DBL (SPONGE) IMPLANT
GLOVE BIO SURGEON STRL SZ7 (GLOVE) ×3 IMPLANT
GOWN STRL REUS W/ TWL XL LVL3 (GOWN DISPOSABLE) ×3 IMPLANT
KIT PROCED FLUENT PRO FLT212S (KITS) ×3 IMPLANT
KIT TURNOVER KIT B (KITS) ×3 IMPLANT
PACK VAGINAL MINOR WOMEN LF (CUSTOM PROCEDURE TRAY) ×3 IMPLANT
PAD OB MATERNITY 11 LF (PERSONAL CARE ITEMS) ×3 IMPLANT
SEAL CERVICAL OMNI LOK (ABLATOR) IMPLANT
SEAL ROD LENS SCOPE MYOSURE (ABLATOR) ×3 IMPLANT
SLEEVE SCD COMPRESS KNEE MED (STOCKING) ×3 IMPLANT
SYSTEM TISS REMOVAL MYOSURE XL (MISCELLANEOUS) IMPLANT
TOWEL GREEN STERILE (TOWEL DISPOSABLE) ×3 IMPLANT

## 2024-09-02 NOTE — Op Note (Addendum)
 PATIENT:  Cynthia [name]  73 y.o. female  PRE-OPERATIVE DIAGNOSIS:  post menopausal bleeding  POST-OPERATIVE DIAGNOSIS:  post menopausal bleeding  PROCEDURE:  Procedure(s): Hysteroscopy with Endometrial Sampling (N/A) and Polypectomy   SURGEON:  Surgeons and Role:    * Marcianna Daily, Charmaine HERO, MD - Primary   ANESTHESIA:   general  EBL:  5 mL   LOCAL MEDICATIONS USED:  1%LIDOCAINE  W/O EPI - Amount: 10 ml  SPECIMEN:    ID Type Source Tests Collected by Time Destination  1 : uterine polyps Tissue PATH Gyn benign resection SURGICAL PATHOLOGY Lane Charmaine HERO, MD 09/02/2024 1815    FINDINGS: Exam under anesthesia revealed retroverted uterus, stenotic cervix.  Hysteroscopy revealed 2 pedunculated, calcified  polyps - 3 cm and 1 cm, non-thickened endometrium Bilateral ostia visualized. No evidence of perforation.  DESCRIPTION OF PROCEDURE: After consent was verified, the patient was taken to the operating room where general anesthesia was administered without difficulty.  The patient was placed in the dorsal lithotomy position using allen stirups. The vagina was then prepped and draped in a normal sterile fashion. An in and out catheterization was performed.   An exam under anesthesia was performed, see above. A bivalve speculum was placed in the vagina. The anterior lip of the cervix was grasped with a single tooth tenaculum.  The cervix was sequentially dilated using pratt dilators to a #19. The hysteroscope was inserted into the cervix and advanced into the uterine cavity without difficulty. Findings as noted above. Myosure Lite was inserted into the uterus and polypectomy was performed. Device was the removed and sharp curettage was performed.    All instruments were removed from the vagina. Hemostasis was noted at the tenaculum sites with pressure. The sponge and instrument counts were correct x2. The patient was taken to the PACU in stable condition.  Fluid Deficit: 100  cc  Charmaine Lane, MD 09/02/24 6:25 PM

## 2024-09-02 NOTE — H&P (Signed)
 Cynthia [name] is an 73 y.o. female presenting for [facility] with polypectomy for PMB and uterine polyp seen on US .   Patient last seen by myself in the office on 07/19/24. No significant changes in medical history of medications since then.   Medical Hx and Meds Reviewed: Obese - Wegovy  2.4 (last dose 2w ago) HTN - metoprolol  (took this AM), atorvastatin , PRN NG, ASA Recurrent cystitis - PRN macrobid    Anxiety - xanax  and bupropion  (took this AM) Osteoporosis - Prolia   Patient denies fever, chills, N/V, vaginal bleeding, urinary symptoms,CP or SOB.   Past Medical History:  Diagnosis Date   Anemia    Anxiety disorder    Aortic valve sclerosis 05/24/2019   Moderate noted on ECHO   Arthritis    right knee   Cancer of the skin, basal cell    Coronary artery disease    Moderate to severe, non-critical single-vessel coronary artery disease with 60-70% proximal LAD stenosis at origin of large diagonal branch ([place] 1,0,0).   Demand ischemia (HCC) 05/24/2019   Depression    GERD (gastroesophageal reflux disease)    TUMS   Hypertension    Lateral meniscus derangement, right    Osteoporosis    Raynaud's phenomenon    questionable because hands get white when cold   SVT (supraventricular tachycardia)     Past Surgical History:  Procedure Laterality Date   CARDIOVERSION  05/24/2019   Adenosine    CATARACT EXTRACTION, BILATERAL     CESAREAN SECTION     x3   CHONDROPLASTY Right 02/21/2017   Procedure: CHONDROPLASTY;  Surgeon: Sheril Coy, MD;  Location: [facility];  Service: Orthopedics;  Laterality: Right;   COLONOSCOPY     KNEE ARTHROSCOPY WITH LATERAL MENISECTOMY Right 02/21/2017   Procedure: KNEE ARTHROSCOPY WITH LATERAL MENISECTOMY;  Surgeon: Sheril Coy, MD;  Location: [facility];  Service: Orthopedics;  Laterality: Right;   KNEE ARTHROSCOPY WITH MEDIAL MENISECTOMY Right 02/21/2017   Procedure: KNEE ARTHROSCOPY WITH MEDIAL MENISECTOMY;   Surgeon: Sheril Coy, MD;  Location:  SURGERY CENTER;  Service: Orthopedics;  Laterality: Right;   LEFT HEART CATH AND CORONARY ANGIOGRAPHY N/A 05/24/2019   Procedure: LEFT HEART CATH AND CORONARY ANGIOGRAPHY;  Surgeon: Mady Bruckner, MD;  Location: [facility];  Service: Cardiovascular;  Laterality: N/A;   MOUTH SURGERY     TOTAL KNEE ARTHROPLASTY Right 07/05/2019   Procedure: RIGHT TOTAL KNEE ARTHROPLASTY;  Surgeon: Liam Lerner, MD;  Location: [facility];  Service: Orthopedics;  Laterality: Right;   TOTAL KNEE ARTHROPLASTY Left 10/20/2019   Procedure: LEFT TOTAL KNEE ARTHROPLASTY;  Surgeon: Liam Lerner, MD;  Location: [facility];  Service: Orthopedics;  Laterality: Left;   Yag Capsulotomy Left 06/30/2019    Family History  Problem Relation Age of Onset   Hypertension Father     Social History:  reports that she has never smoked. She has never used smokeless tobacco. She reports that she does not currently use alcohol. She reports that she does not use drugs.  Allergies:  Allergies  Allergen Reactions   Penicillins Rash    Pt does not remember, states she was real young    Medications Prior to Admission  Medication Sig Dispense Refill Last Dose/Taking   ALPRAZolam  (XANAX ) 0.5 MG tablet Take 0.25 mg by mouth at bedtime as needed for anxiety.    Past Month   atorvastatin  (LIPITOR) 40 MG tablet Take 1 tablet (40 mg total) by mouth daily. 90 tablet 3 09/01/2024  buPROPion  (WELLBUTRIN  XL) 300 MG 24 hr tablet Take 300 mg by mouth daily.   09/02/2024 at  9:00 AM   Calcium  Carbonate-Vitamin D (CALTRATE 600+D PO) Take 1 tablet by mouth daily.   Past Week   clobetasol (TEMOVATE) 0.05 % external solution Apply 1 Application topically.   09/01/2024   ergocalciferol (VITAMIN D2) 1.25 MG (50000 UT) capsule Vitamin D2 1,250 mcg (50,000 unit) capsule   Past Week   ibuprofen (ADVIL) 400 MG tablet Take 400 mg by mouth every 6 (six) hours as needed.   Past Week   ketoconazole (NIZORAL) 2  % shampoo Apply 1 Application topically 2 (two) times a week.   09/01/2024   metoprolol  succinate (TOPROL -XL) 25 MG 24 hr tablet Take 1 tablet (25 mg total) by mouth daily. 90 tablet 3 09/02/2024 at  9:00 AM   Multiple Vitamins-Minerals (MULTIVITAMIN ADULT PO) Take 1 tablet by mouth daily.   Past Week   nitrofurantoin , macrocrystal-monohydrate, (MACROBID ) 100 MG capsule Take 100 mg by mouth 2 (two) times daily.   Past Week   tretinoin (RETIN-A) 0.025 % cream Apply a pea size AMOUNT TO face nightly.]   09/01/2024   aspirin  EC 81 MG tablet Take 81 mg by mouth daily.   08/29/2024   denosumab  (PROLIA ) 60 MG/ML SOSY injection Inject 60 mg into the skin every 6 (six) months.   More than a month   ferrous sulfate 325 (65 FE) MG tablet Take 325 mg by mouth daily with breakfast. (Patient not taking: Reported on 12/22/2023)      nitroGLYCERIN  (NITROSTAT ) 0.4 MG SL tablet Place 1 tablet (0.4 mg total) under the tongue every 5 (five) minutes x 3 doses as needed for chest pain. 25 tablet 3 Unknown   nystatin-triamcinolone (MYCOLOG II) cream Apply 1 application  topically daily as needed for rash.   Unknown   semaglutide -weight management (WEGOVY ) 2.4 MG/0.75ML SOAJ SQ injection Inject 2.4 mg into the skin once a week. 3 mL 5 08/16/2024    Review of Systems  Constitutional:  Negative for chills and fever.  Respiratory:  Negative for cough and shortness of breath.   Cardiovascular:  Negative for chest pain.  Gastrointestinal:  Negative for abdominal pain, diarrhea, nausea and vomiting.  Genitourinary:  Negative for dysuria and hematuria.  Skin:  Negative for rash.  Neurological:  Negative for syncope, numbness and headaches.  Psychiatric/Behavioral:  The patient is nervous/anxious.     Blood pressure 133/72, pulse 68, temperature 97.7 F (36.5 C), temperature source Oral, resp. rate 17, height 5' 5 (1.651 m), weight 71.7 kg, SpO2 98%. Physical Exam Constitutional:      Appearance: Normal appearance.   HENT:     Head: Normocephalic.     Mouth/Throat:     Mouth: Mucous membranes are moist.  Eyes:     Pupils: Pupils are equal, round, and reactive to light.  Cardiovascular:     Rate and Rhythm: Normal rate.  Pulmonary:     Effort: Pulmonary effort is normal.  Abdominal:     General: Abdomen is flat.     Palpations: Abdomen is soft.  Musculoskeletal:        General: Normal range of motion.     Cervical back: Normal range of motion.  Skin:    General: Skin is warm.  Neurological:     General: No focal deficit present.     Mental Status: She is alert and oriented to person, place, and time.  Psychiatric:  Mood and Affect: Mood normal.        Behavior: Behavior normal.     Images:   US  - 07/19/24   retroverted uterus, endometrium 1.2 mm anterior wall and 1.1 mm posterior wall, also w/ fluid and polyp measuring 3.3x3 cm present, fundal fibroid abutting endometrium - 3.8x3.8x3.2 cm, nml appearing right ovary, right hydrosaplinx 3.7x1.6x2.4 cm. Nml left ovary - small simple cyst 1.1x1x0.6 cm.   Results for orders placed or performed during the hospital encounter of 09/02/24 (from the past 24 hours)  CBC     Status: None   Collection Time: 09/02/24 11:02 AM  Result Value Ref Range   WBC 6.1 4.0 - 10.5 K/uL   RBC 4.43 3.87 - 5.11 MIL/uL   Hemoglobin 12.9 12.0 - 15.0 g/dL   HCT 61.0 63.9 - 53.9 %   MCV 87.8 80.0 - 100.0 fL   MCH 29.1 26.0 - 34.0 pg   MCHC 33.2 30.0 - 36.0 g/dL   RDW 86.8 88.4 - 84.4 %   Platelets 215 150 - 400 K/uL   nRBC 0.0 0.0 - 0.2 %  Basic metabolic panel     Status: Abnormal   Collection Time: 09/02/24 11:02 AM  Result Value Ref Range   Sodium 136 135 - 145 mmol/L   Potassium 4.0 3.5 - 5.1 mmol/L   Chloride 103 98 - 111 mmol/L   CO2 23 22 - 32 mmol/L   Glucose, Bld 86 70 - 99 mg/dL   BUN 12 8 - 23 mg/dL   Creatinine, Ser 9.34 0.44 - 1.00 mg/dL   Calcium  8.8 (L) 8.9 - 10.3 mg/dL   GFR, Estimated >39 >39 mL/min   Anion gap 10 5 - 15   Glucose, capillary     Status: None   Collection Time: 09/02/24 11:03 AM  Result Value Ref Range   Glucose-Capillary 79 70 - 99 mg/dL     Assessment/Plan: 73 y.o. G3P3 post-menopausal admitted for [facility] with polypectomy for PMB and uterine polyp seen on US   - Surgical consents signed for [facility] w/ polypectomy  - Labs reviewed - No pre-op abx indicated - Discharge from PACU, discharge instructions discussed w/ patient and her partner    Charmaine CHRISTELLA Oz 09/02/2024, 12:22 PM

## 2024-09-02 NOTE — Anesthesia Preprocedure Evaluation (Addendum)
 Anesthesia Evaluation  Patient identified by MRN, date of birth, ID band Patient awake    Reviewed: Allergy & Precautions, NPO status , Patient's Chart, lab work & pertinent test results  Airway Mallampati: II  TM Distance: >3 FB Neck ROM: Full    Dental no notable dental hx.    Pulmonary neg pulmonary ROS   Pulmonary exam normal        Cardiovascular hypertension, Pt. on medications and Pt. on home beta blockers + CAD  + dysrhythmias Supra Ventricular Tachycardia  Rhythm:Regular Rate:Normal  ECHO 2020: IMPRESSIONS   1. The left ventricle has normal systolic function, with an ejection  fraction of 55-60%. The cavity size was normal. Left ventricular diastolic  parameters were normal. No evidence of left ventricular regional wall  motion abnormalities.   2. The aortic valve is tricuspid. Moderate sclerosis of the aortic valve.   3. The aorta is normal unless otherwise noted.     Neuro/Psych   Anxiety Depression    negative neurological ROS     GI/Hepatic Neg liver ROS,GERD  ,,  Endo/Other  negative endocrine ROS    Renal/GU negative Renal ROS  Female GU complaint     Musculoskeletal  (+) Arthritis ,    Abdominal Normal abdominal exam  (+)   Peds  Hematology  (+) Blood dyscrasia, anemia Lab Results      Component                Value               Date                      WBC                      6.1                 09/02/2024                HGB                      12.9                09/02/2024                HCT                      38.9                09/02/2024                MCV                      87.8                09/02/2024                PLT                      215                 09/02/2024             Lab Results      Component                Value               Date  NA                       136                 09/02/2024                K                        4.0                  09/02/2024                CO2                      23                  09/02/2024                GLUCOSE                  86                  09/02/2024                BUN                      12                  09/02/2024                CREATININE               0.65                09/02/2024                CALCIUM                   8.8 (L)             09/02/2024                EGFR                     83.0                05/26/2023                GFRNONAA                 >60                 09/02/2024              Anesthesia Other Findings   Reproductive/Obstetrics                              Anesthesia Physical Anesthesia Plan  ASA: 3  Anesthesia Plan: General   Post-op Pain Management: Ofirmev  IV (intra-op)*   Induction: Intravenous  PONV Risk Score and Plan: 3 and Ondansetron , Dexamethasone  and Treatment may vary due to age or medical condition  Airway Management Planned: Mask and LMA  Additional Equipment: None  Intra-op Plan:   Post-operative Plan: Extubation in OR  Informed Consent: I have reviewed the patients History and Physical, chart, labs and discussed the procedure including the risks, benefits and alternatives for the proposed anesthesia  with the patient or authorized representative who has indicated his/her understanding and acceptance.     Dental advisory given  Plan Discussed with: CRNA  Anesthesia Plan Comments:          Anesthesia Quick Evaluation

## 2024-09-02 NOTE — Anesthesia Procedure Notes (Signed)
 Procedure Name: LMA Insertion Date/Time: 09/02/2024 5:48 PM  Performed by: Harrold Macintosh, CRNAPre-anesthesia Checklist: Patient identified, Emergency Drugs available, Suction available and Patient being monitored Patient Re-evaluated:Patient Re-evaluated prior to induction Oxygen Delivery Method: Circle system utilized Preoxygenation: Pre-oxygenation with 100% oxygen Induction Type: IV induction LMA: LMA inserted LMA Size: 4.0 Number of attempts: 1 Placement Confirmation: breath sounds checked- equal and bilateral and positive ETCO2 Tube secured with: Tape Dental Injury: Teeth and Oropharynx as per pre-operative assessment

## 2024-09-02 NOTE — Transfer of Care (Signed)
 Immediate Anesthesia Transfer of Care Note  Patient: Cynthia [name]  Procedure(s) Performed: Hysteroscopy with Endometrial Sampling (Vagina )  Patient Location: PACU  Anesthesia Type:General  Level of Consciousness: awake, alert , and oriented  Airway & Oxygen Therapy: Patient Spontanous Breathing  Post-op Assessment: Report given to RN, Post -op Vital signs reviewed and stable, Patient moving all extremities X 4, and Patient able to stick tongue midline  Post vital signs: Reviewed and stable  Last Vitals:  Vitals Value Taken Time  BP 138/73   Temp 97.6   Pulse 83 09/02/24 18:26  Resp 12   SpO2 99 % 09/02/24 18:26  Vitals shown include unfiled device data.  Last Pain:  Vitals:   09/02/24 1047  TempSrc: Oral  PainSc: 2       Patients Stated Pain Goal: 3 (09/02/24 1047)  Complications: No notable events documented.

## 2024-09-02 NOTE — Anesthesia Postprocedure Evaluation (Signed)
 Anesthesia Post Note  Patient: Cynthia [name]  Procedure(s) Performed: Hysteroscopy with Endometrial Sampling (Vagina )     Patient location during evaluation: PACU Anesthesia Type: General Level of consciousness: awake and alert Pain management: pain level controlled Vital Signs Assessment: post-procedure vital signs reviewed and stable Respiratory status: spontaneous breathing, nonlabored ventilation, respiratory function stable and patient connected to nasal cannula oxygen Cardiovascular status: blood pressure returned to baseline and stable Postop Assessment: no apparent nausea or vomiting Anesthetic complications: no   No notable events documented.  Last Vitals:  Vitals:   09/02/24 1945 09/02/24 1954  BP: (!) 149/99 (!) 155/70  Pulse: 75 67  Resp: 12 16  Temp:  36.4 C  SpO2: 97% 97%    Last Pain:  Vitals:   09/02/24 1954  TempSrc:   PainSc: 5                  Thom JONELLE Peoples

## 2024-09-03 ENCOUNTER — Encounter (HOSPITAL_COMMUNITY): Payer: Self-pay

## 2024-09-06 LAB — SURGICAL PATHOLOGY

## 2024-09-19 ENCOUNTER — Other Ambulatory Visit: Payer: Self-pay | Admitting: Cardiology

## 2024-09-19 DIAGNOSIS — I2510 Atherosclerotic heart disease of native coronary artery without angina pectoris: Secondary | ICD-10-CM

## 2024-09-19 DIAGNOSIS — E669 Obesity, unspecified: Secondary | ICD-10-CM

## 2024-09-20 ENCOUNTER — Other Ambulatory Visit (HOSPITAL_COMMUNITY): Payer: Self-pay

## 2024-09-20 MED ORDER — WEGOVY 2.4 MG/0.75ML ~~LOC~~ SOAJ
2.4000 mg | SUBCUTANEOUS | 5 refills | Status: AC
  Filled 2024-09-20: qty 3, 28d supply, fill #0
  Filled 2024-10-15: qty 3, 28d supply, fill #1

## 2024-10-11 ENCOUNTER — Other Ambulatory Visit: Payer: Self-pay | Admitting: General Surgery

## 2024-10-11 DIAGNOSIS — N649 Disorder of breast, unspecified: Secondary | ICD-10-CM

## 2024-10-15 ENCOUNTER — Other Ambulatory Visit (HOSPITAL_COMMUNITY): Payer: Self-pay

## 2024-10-15 ENCOUNTER — Other Ambulatory Visit: Payer: Self-pay

## 2024-10-19 ENCOUNTER — Ambulatory Visit
Admission: RE | Admit: 2024-10-19 | Discharge: 2024-10-19 | Disposition: A | Source: Ambulatory Visit | Attending: General Surgery | Admitting: General Surgery

## 2024-10-19 DIAGNOSIS — N649 Disorder of breast, unspecified: Secondary | ICD-10-CM

## 2024-10-21 ENCOUNTER — Ambulatory Visit: Payer: Self-pay | Admitting: General Surgery

## 2024-12-07 ENCOUNTER — Ambulatory Visit: Admitting: Family Medicine

## 2024-12-22 ENCOUNTER — Ambulatory Visit: Admitting: Cardiology
